# Patient Record
Sex: Female | Born: 1990 | Race: White | Hispanic: No | Marital: Married | Smoking: Never smoker
Health system: Southern US, Community
[De-identification: ages and names within clinical notes are randomized; demographics above are authoritative.]

## PROBLEM LIST (undated history)

## (undated) DIAGNOSIS — R519 Headache, unspecified: Secondary | ICD-10-CM

## (undated) DIAGNOSIS — Z91018 Allergy to other foods: Secondary | ICD-10-CM

## (undated) DIAGNOSIS — O1490 Unspecified pre-eclampsia, unspecified trimester: Secondary | ICD-10-CM

## (undated) DIAGNOSIS — O24419 Gestational diabetes mellitus in pregnancy, unspecified control: Secondary | ICD-10-CM

## (undated) DIAGNOSIS — K829 Disease of gallbladder, unspecified: Secondary | ICD-10-CM

## (undated) DIAGNOSIS — K760 Fatty (change of) liver, not elsewhere classified: Secondary | ICD-10-CM

## (undated) DIAGNOSIS — J302 Other seasonal allergic rhinitis: Secondary | ICD-10-CM

## (undated) DIAGNOSIS — R7303 Prediabetes: Secondary | ICD-10-CM

## (undated) DIAGNOSIS — M549 Dorsalgia, unspecified: Secondary | ICD-10-CM

## (undated) DIAGNOSIS — M7989 Other specified soft tissue disorders: Secondary | ICD-10-CM

## (undated) DIAGNOSIS — J45909 Unspecified asthma, uncomplicated: Secondary | ICD-10-CM

## (undated) DIAGNOSIS — R51 Headache: Secondary | ICD-10-CM

## (undated) DIAGNOSIS — N949 Unspecified condition associated with female genital organs and menstrual cycle: Secondary | ICD-10-CM

## (undated) DIAGNOSIS — G43909 Migraine, unspecified, not intractable, without status migrainosus: Secondary | ICD-10-CM

## (undated) DIAGNOSIS — K5900 Constipation, unspecified: Secondary | ICD-10-CM

## (undated) HISTORY — DX: Gestational diabetes mellitus in pregnancy, unspecified control: O24.419

## (undated) HISTORY — DX: Unspecified pre-eclampsia, unspecified trimester: O14.90

## (undated) HISTORY — DX: Unspecified condition associated with female genital organs and menstrual cycle: N94.9

## (undated) HISTORY — DX: Unspecified asthma, uncomplicated: J45.909

## (undated) HISTORY — PX: CYST EXCISION: SHX5701

## (undated) HISTORY — DX: Allergy to other foods: Z91.018

## (undated) HISTORY — DX: Other specified soft tissue disorders: M79.89

## (undated) HISTORY — DX: Dorsalgia, unspecified: M54.9

## (undated) HISTORY — DX: Prediabetes: R73.03

## (undated) HISTORY — DX: Constipation, unspecified: K59.00

## (undated) HISTORY — DX: Disease of gallbladder, unspecified: K82.9

## (undated) HISTORY — DX: Other seasonal allergic rhinitis: J30.2

## (undated) HISTORY — PX: APPENDECTOMY: SHX54

## (undated) HISTORY — DX: Fatty (change of) liver, not elsewhere classified: K76.0

## (undated) HISTORY — DX: Migraine, unspecified, not intractable, without status migrainosus: G43.909

---

## 2010-09-29 ENCOUNTER — Emergency Department (HOSPITAL_COMMUNITY)
Admission: EM | Admit: 2010-09-29 | Discharge: 2010-09-29 | Payer: Self-pay | Source: Home / Self Care | Admitting: Emergency Medicine

## 2013-09-29 ENCOUNTER — Other Ambulatory Visit (HOSPITAL_COMMUNITY)
Admission: RE | Admit: 2013-09-29 | Discharge: 2013-09-29 | Disposition: A | Discharge status: Home / Self Care | Payer: BC Managed Care – PPO | Source: Ambulatory Visit | Attending: Family Medicine | Admitting: Family Medicine

## 2013-09-29 ENCOUNTER — Other Ambulatory Visit: Payer: Self-pay | Admitting: Family Medicine

## 2013-09-29 DIAGNOSIS — Z01419 Encounter for gynecological examination (general) (routine) without abnormal findings: Secondary | ICD-10-CM | POA: Insufficient documentation

## 2014-11-04 ENCOUNTER — Ambulatory Visit (INDEPENDENT_AMBULATORY_CARE_PROVIDER_SITE_OTHER): Payer: BC Managed Care – PPO | Admitting: Neurology

## 2014-11-04 ENCOUNTER — Encounter: Payer: Self-pay | Admitting: Neurology

## 2014-11-04 VITALS — BP 132/78 | HR 88 | Ht 64.0 in | Wt 225.0 lb

## 2014-11-04 DIAGNOSIS — M79602 Pain in left arm: Secondary | ICD-10-CM

## 2014-11-04 DIAGNOSIS — M79601 Pain in right arm: Secondary | ICD-10-CM

## 2014-11-04 DIAGNOSIS — E669 Obesity, unspecified: Secondary | ICD-10-CM

## 2014-11-04 DIAGNOSIS — M79603 Pain in arm, unspecified: Secondary | ICD-10-CM

## 2014-11-04 DIAGNOSIS — G5622 Lesion of ulnar nerve, left upper limb: Secondary | ICD-10-CM

## 2014-11-04 DIAGNOSIS — R202 Paresthesia of skin: Secondary | ICD-10-CM

## 2014-11-04 NOTE — Patient Instructions (Addendum)
You most likely have either carpal tunnel syndrome or ulnar neuropathy affecting the left hand.  We will order a nerve conduction study/EMG of the hands to better help localize the problem.    Keep up the good work with your weight loss strategies  We will discuss the results at your next visit in 6-8 weeks.

## 2014-11-04 NOTE — Progress Notes (Signed)
[facility] Neurology [facility] Clinic Note - Initial Visit   Date: 11/04/2014   Catherine Christian MRN: [id number] DOB: [birth year]   Dear Dr. Azucena Cecil:  Thank you for your kind referral of Catherine Christian for consultation of bilateral hand parethesias. Although her history is well known to you, please allow [place] to reiterate it for the purpose of our medical record. The patient was accompanied to the clinic by self.    History of Present Illness: Catherine Christian is a 24 y.o. right-handed Caucasian female with obesity presenting for evaluation of bilateral hand paresthesias.    Since late 2015, she noticed that her hands would fall asleep, described as numbness/tingling, and wake her up from sleeping.  Two weeks ago, she has a spell where her last three fingers on the left hand never completed woke up and lasted much longer into the afternoon which concerned her, so she called her PCP who referred here to see [place].  Symptoms wake her up from sleeping, but she is usually able to "shake" them to make it better, which alleviates paresthesias within 5-10 minutes.  Denies any weakness or neck discomfort.  She does not have any particular sleeping position which exacerbates symptoms.  PMHx:  None  Past Surgical History  Procedure Laterality Date  . Cyst excision  age 71    neck  . Appendectomy     Medications:   None  Allergies: No Known Allergies  Family History: Family History  Problem Relation Age of Onset  . Healthy Mother   . Healthy Father     Social History: History   Social History  . Marital Status: Single    Spouse Name: N/A  . Number of Children: N/A  . Years of Education: N/A   Occupational History  . Not on file.   Social History Main Topics  . Smoking status: Never Smoker   . Smokeless tobacco: Not on file  . Alcohol Use: 0.0 oz/week    0 Standard drinks or equivalent per week     Comment: once a week  . Drug Use: No  . Sexual Activity: Not on file    Other Topics Concern  . Not on file   Social History Narrative   She is a Pension scheme manager.   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   She lives with twin sister.     Review of Systems:  CONSTITUTIONAL: No fevers, chills, night sweats, or weight loss.   EYES: No visual changes or eye pain ENT: No hearing changes.  No history of nose bleeds.   RESPIRATORY: No cough, wheezing and shortness of breath.   CARDIOVASCULAR: Negative for chest pain, and palpitations.   GI: Negative for abdominal discomfort, blood in stools or black stools.  No recent change in bowel habits.   GU:  No history of incontinence.   MUSCLOSKELETAL: No history of joint pain or swelling.  No myalgias.   SKIN: Negative for lesions, rash, and itching.   HEMATOLOGY/ONCOLOGY: Negative for prolonged bleeding, bruising easily, and swollen nodes.  No history of cancer.   ENDOCRINE: Negative for cold or heat intolerance, polydipsia or goiter.   PSYCH:  No depression or anxiety symptoms.   NEURO: As Above.   Vital Signs:  BP 132/78 mmHg  Pulse 88  Ht  (1.626 m)  Wt 225 lb (102.059 kg)  BMI 38.60 kg/m2   General Medical Exam:   General:  Well appearing, comfortable.   Eyes/ENT: see cranial nerve examination.   Neck:  No masses appreciated.  Full range of motion without tenderness.  No carotid bruits. Respiratory:  Clear to auscultation, good air entry bilaterally.   Cardiac:  Regular rate and rhythm, no murmur.   Extremities:  No deformities, edema, or skin discoloration.  Skin:  No rashes or lesions.  Neurological Exam: MENTAL STATUS including orientation to time, place, person, recent and remote memory, attention span and concentration, language, and fund of knowledge is normal.  Speech is not dysarthric.  CRANIAL NERVES: II:  No visual field defects.  Unremarkable fundi.   III-IV-VI: Pupils equal round and reactive to light.  Normal conjugate, extra-ocular eye movements in all directions of gaze.   No nystagmus.  No ptosis.   V:  Normal facial sensation.     VII:  Normal facial symmetry and movements.    VIII:  Normal hearing and vestibular function.   IX-X:  Normal palatal movement.   XI:  Normal shoulder shrug and head rotation.   XII:  Normal tongue strength and range of motion, no deviation or fasciculation.  MOTOR:  No atrophy, fasciculations or abnormal movements.  No pronator drift.  Tone is normal.  Negative Tinel's at the elbow and wrist bilaterally.  Right Upper Extremity:    Left Upper Extremity:    Deltoid  5/5   Deltoid  5/5   Biceps  5/5   Biceps  5/5   Triceps  5/5   Triceps  5/5   Wrist extensors  5/5   Wrist extensors  5/5   Wrist flexors  5/5   Wrist flexors  5/5   Finger extensors  5/5   Finger extensors  5/5   Finger flexors  5/5   Finger flexors  5/5   Dorsal interossei  5/5   Dorsal interossei  5/5   Abductor pollicis  5/5   Abductor pollicis  5/5   Tone (Ashworth scale)  0  Tone (Ashworth scale)  0   Right Lower Extremity:    Left Lower Extremity:    Hip flexors  5/5   Hip flexors  5/5   Hip extensors  5/5   Hip extensors  5/5   Knee flexors  5/5   Knee flexors  5/5   Knee extensors  5/5   Knee extensors  5/5   Dorsiflexors  5/5   Dorsiflexors  5/5   Plantarflexors  5/5   Plantarflexors  5/5   Toe extensors  5/5   Toe extensors  5/5   Toe flexors  5/5   Toe flexors  5/5   Tone (Ashworth scale)  0  Tone (Ashworth scale)  0   MSRs:  Right                                                                 Left brachioradialis 2+  brachioradialis 2+  biceps 2+  biceps 2+  triceps 2+  triceps 2+  patellar 2+  patellar 2+  ankle jerk 2+  ankle jerk 2+  Hoffman no  Hoffman no  plantar response down  plantar response down   SENSORY:  Normal and symmetric perception of light touch, pinprick, vibration, and proprioception.  Romberg's sign absent.   COORDINATION/GAIT: Normal finger-to- nose-finger and heel-to-shin.  Intact rapid alternating movements  bilaterally.  Able to rise from a chair without using arms.  Gait narrow based and stable. Tandem and stressed gait intact.    IMPRESSION/PLAN: [name] is a delightful 24 year-old female presenting for evaluation of bilateral hand paresthesias, worse on the left.  Her exam is entirely normal and non-focal.  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  EMG of the arms will be ordered to better localize her symptoms.    From generalized well-being stand point, weight loss was encouraged.  Return to clinic in 6-8 weeks   The duration of this appointment visit was 35 minutes of face-to-face time with the patient.  Greater than 50% of this time was spent in counseling, explanation of diagnosis, planning of further management, and coordination of care.   Thank you for allowing me to participate in patient's care.  If I can answer any additional questions, I would be pleased to do so.    Sincerely,    Karryn Kosinski K. Allena Katz, DO

## 2014-11-04 NOTE — Progress Notes (Signed)
Note faxed.

## 2014-12-12 ENCOUNTER — Ambulatory Visit (INDEPENDENT_AMBULATORY_CARE_PROVIDER_SITE_OTHER): Payer: BC Managed Care – PPO | Admitting: Neurology

## 2014-12-12 DIAGNOSIS — G5622 Lesion of ulnar nerve, left upper limb: Secondary | ICD-10-CM

## 2014-12-12 NOTE — Procedures (Signed)
[facility]  [address], [address]  [place], [place] [address] Tel: [phone] Fax:  [phone] Test Date:  12/12/2014  Patient: Catherine CottonMarissa Christian DOB: Feb 02, 1991 Physician: Nita Sickleonika Tyshan Enderle  Sex: Female Height: 5\' 4"  Ref Phys: Nita Sickleatel, Liliyana Thobe  ID#: [id number] Temp: 33.0C Technician: Ala BentSusan Reid R. NCS T.   Patient Complaints: Patient is a 24 year old female here for evaluation of paresthesias in both hands left worse than right.  NCV & EMG Findings: Extensive electrodiagnostic testing of the left upper extremity and additional studies of the right shows:  1. Bilateral median, ulnar, and palmar sensory responses are within normal limits.  2. Bilateral median and ulnar motor responses are within normal limits.  3. There is no evidence of active or chronic motor axon loss changes affecting any of the tested muscles. Motor unit configuration and recruitment pattern is normal.   Impression: This is a normal study of the upper extremities. In particular, there is no evidence of carpal tunnel syndrome, ulnar neuropathy, or cervical radiculopathy affecting the upper extremities.   ___________________________ Nita Sickleonika Daesia Zylka    Nerve Conduction Studies Anti Sensory Summary Table   Stim Site NR Peak (ms) Norm Peak (ms) P-T Amp (V) Norm P-T Amp  Left Median Anti Sensory (2nd Digit)  33C  Wrist    2.9 <3.3 81.0 >20  Right Median Anti Sensory (2nd Digit)  33C  Wrist    2.6 <3.3 68.2 >20  Left Ulnar Anti Sensory (5th Digit)  33C  Wrist    2.9 <3.0 46.5 >18  Right Ulnar Anti Sensory (5th Digit)  33C  Wrist    2.4 <3.0 45.2 >18   Motor Summary Table   Stim Site NR Onset (ms) Norm Onset (ms) O-P Amp (mV) Norm O-P Amp Site1 Site2 Delta-0 (ms) Dist (cm) Vel (m/s) Norm Vel (m/s)  Left Median Motor (Abd Poll Brev)  33C  Wrist    3.0 <3.9 16.6 >6 Elbow Wrist 4.5 27.0 60 >51  Elbow    7.5  14.8         Right Median Motor (Abd Poll Brev)  33C  Wrist    2.7 <3.9 14.5 >6 Elbow  Wrist 3.8 24.5 64 >51  Elbow    6.5  13.0         Left Ulnar Motor (Abd Dig Minimi)  33C  Wrist    2.1 <3.0 9.8 >8 B Elbow Wrist 3.6 22.0 61 >51  B Elbow    5.7  9.6  A Elbow B Elbow 1.6 10.0 63 >51  A Elbow    7.3  9.3         Right Ulnar Motor (Abd Dig Minimi)  33C  Wrist    2.0 <3.0 11.7 >8 B Elbow Wrist 3.4 24.0 71 >51  B Elbow    5.4  11.6  A Elbow B Elbow 1.4 10.0 71 >51  A Elbow    6.8  11.7          Comparison Summary Table   Stim Site NR Peak (ms) Norm Peak (ms) P-T Amp (V) Site1 Site2 Delta-P (ms) Norm Delta (ms)  Left Median/Ulnar Palm Comparison (Wrist - 8cm)  33C  Median Palm    1.7 <2.2 68.8 Median Palm Ulnar Palm 0.1   Ulnar Palm    1.8 <2.2 20.2      Right Median/Ulnar Palm Comparison (Wrist - 8cm)  33C  Median Palm    1.6 <2.2 71.1 Median Palm Ulnar Palm 0.2  Ulnar Palm    1.4 <2.2 23.5       EMG   Side Muscle Ins Act Fibs Psw Fasc Number Recrt Dur Dur. Amp Amp. Poly Poly. Comment  Left 1stDorInt Nml Nml Nml Nml Nml Nml Nml Nml Nml Nml Nml Nml N/A  Left Ext Indicis Nml Nml Nml Nml Nml Nml Nml Nml Nml Nml Nml Nml N/A  Left PronatorTeres Nml Nml Nml Nml Nml Nml Nml Nml Nml Nml Nml Nml N/A  Left Biceps Nml Nml Nml Nml Nml Nml Nml Nml Nml Nml Nml Nml N/A  Left Triceps Nml Nml Nml Nml Nml Nml Nml Nml Nml Nml Nml Nml N/A  Left Deltoid Nml Nml Nml Nml Nml Nml Nml Nml Nml Nml Nml Nml N/A  Right 1stDorInt Nml Nml Nml Nml Nml Nml Nml Nml Nml Nml Nml Nml N/A  Right Ext Indicis Nml Nml Nml Nml Nml Nml Nml Nml Nml Nml Nml Nml N/A  Right PronatorTeres Nml Nml Nml Nml Nml Nml Nml Nml Nml Nml Nml Nml N/A  Right Biceps Nml Nml Nml Nml Nml Nml Nml Nml Nml Nml Nml Nml N/A  Right Deltoid Nml Nml Nml Nml Nml Nml Nml Nml Nml Nml Nml Nml N/A  Right Triceps Nml Nml Nml Nml Nml Nml Nml Nml Nml Nml Nml Nml N/A      Waveforms:

## 2014-12-15 ENCOUNTER — Ambulatory Visit (INDEPENDENT_AMBULATORY_CARE_PROVIDER_SITE_OTHER): Payer: BC Managed Care – PPO | Admitting: Neurology

## 2014-12-15 ENCOUNTER — Encounter: Payer: Self-pay | Admitting: Neurology

## 2014-12-15 VITALS — BP 110/80 | HR 87 | Ht 64.0 in | Wt 231.4 lb

## 2014-12-15 DIAGNOSIS — E669 Obesity, unspecified: Secondary | ICD-10-CM

## 2014-12-15 DIAGNOSIS — R202 Paresthesia of skin: Secondary | ICD-10-CM | POA: Diagnosis not present

## 2014-12-15 DIAGNOSIS — G5622 Lesion of ulnar nerve, left upper limb: Secondary | ICD-10-CM | POA: Diagnosis not present

## 2014-12-15 LAB — VITAMIN B12: Vitamin B-12: 535 pg/mL (ref 211–911)

## 2014-12-15 LAB — TSH: TSH: 1.607 u[IU]/mL (ref 0.350–4.500)

## 2014-12-15 NOTE — Patient Instructions (Addendum)
Try to avoid hyperflexion of the elbow and start using a soft elbow pad. Stay active and start an exercise program you enjoy We will check vitamin B12 and thyroid function Come back and see me, if your symptoms worsen

## 2014-12-15 NOTE — Progress Notes (Signed)
Follow-up Visit   Date: 12/15/2014    Catherine Christian MRN: [id number] DOB: 30-Dec-1990   Interim History: Catherine Christian is a 24 y.o. right-handed Caucasian female returning to the clinic for follow-up of bilateral hand paresthesias.  The patient was accompanied to the clinic by self.  History of present illness: Since late 2015, she noticed that her hands would fall asleep, described as numbness/tingling, and wake her up from sleeping. Two weeks ago, she has a spell where her last three fingers on the left hand never completed woke up and lasted much longer into the afternoon which concerned her, so she called her PCP who referred here to see us. Symptoms wake her up from sleeping, but she is usually able to "shake" them to make it better, which alleviates paresthesias within 5-10 minutes. Denies any weakness or neck discomfort. She does not have any particular sleeping position which exacerbates symptoms.  UPDATE 12/15/2014:  EMG of the upper extremities was normal and did not show evidence of CTS, ulnar neuropathy, or a cervical radiculopathy.   Since her last visit, her hand sensation did return to normal.  However, she notices that when her hands do fall asleep, they will wake up again.  She has no new complaints.   Medications:  No current outpatient prescriptions on file prior to visit.   No current facility-administered medications on file prior to visit.    Allergies: No Known Allergies  Review of Systems:  CONSTITUTIONAL: No fevers, chills, night sweats, or weight loss.  EYES: No visual changes or eye pain ENT: No hearing changes.  No history of nose bleeds.   RESPIRATORY: No cough, wheezing and shortness of breath.   CARDIOVASCULAR: Negative for chest pain, and palpitations.   GI: Negative for abdominal discomfort, blood in stools or black stools.  No recent change in bowel habits.   GU:  No history of incontinence.   MUSCLOSKELETAL: No history of joint pain or  swelling.  No myalgias.   SKIN: Negative for lesions, rash, and itching.   ENDOCRINE: Negative for cold or heat intolerance, polydipsia or goiter.   PSYCH:  No depression or anxiety symptoms.   NEURO: As Above.   Vital Signs:  BP 110/80 mmHg  Pulse 87  Ht 5\' 4"  (1.626 m)  Wt 231 lb 7 oz (104.979 kg)  BMI 39.71 kg/m2  SpO2 97%  Neurological Exam: MENTAL STATUS including orientation to time, place, person, recent and remote memory, attention span and concentration, language, and fund of knowledge is normal.  Speech is not dysarthric.  CRANIAL NERVES:  Pupils equal round and reactive to light.  Normal conjugate, extra-ocular eye movements in all directions of gaze.  No ptosis. Face is symmetric. Palate elevates symmetrically.  Tongue is midline.  MOTOR:  Motor strength is 5/5 in all extremities.  No atrophy, fasciculations or abnormal movements.  No pronator drift.  Tone is normal.    MSRs:  Reflexes are 2+/4 throughout.  SENSORY:  Intact to vibration throughout.  COORDINATION/GAIT:    Gait narrow based and stable.   Data: EMG of the upper extremities 12/12/2014: This is a normal study of the upper extremities. In particular, there is no evidence of carpal tunnel syndrome, ulnar neuropathy, or cervical radiculopathy affecting the upper extremities.   IMPRESSION/PLAN: Catherine Christian is a delightful 24 year-old female presenting for evaluation of bilateral hand paresthesias, worse on the left. Her exam is entirely normal and non-focal and she is doing better than previously because symptoms are  intermittent again.  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  It is possible that symptoms are too mild to be detected on EMG and we may need to see how her symptoms evolve over time. I would recommend that we treat this as possible entrapment neuropathy and avoid hyperflexion of the elbow as well as start  using a soft elbow bad.   If symptoms worsen, patient will contact my office so we can order MRI brain.    For completeness, check TSH and vitamin B12 for paresthesias.  Weight loss strategies discussed and I encouraged her to start an exercise program which she enjoys doing.  Return to clinic as needed.    The duration of this appointment visit was 25 minutes of face-to-face time with the patient.  Greater than 50% of this time was spent in counseling, explanation of diagnosis, planning of further management, and coordination of care.   Thank you for allowing me to participate in patient's care.  If I can answer any additional questions, I would be pleased to do so.    Sincerely,    Sita Mangen K. Allena Katz, DO

## 2014-12-15 NOTE — Progress Notes (Signed)
Note sent

## 2015-07-04 ENCOUNTER — Other Ambulatory Visit: Payer: Self-pay | Admitting: Family Medicine

## 2015-07-04 DIAGNOSIS — R1031 Right lower quadrant pain: Secondary | ICD-10-CM

## 2015-07-10 ENCOUNTER — Ambulatory Visit
Admission: RE | Admit: 2015-07-10 | Discharge: 2015-07-10 | Disposition: A | Discharge status: Home / Self Care | Payer: BC Managed Care – PPO | Source: Ambulatory Visit | Attending: Family Medicine | Admitting: Family Medicine

## 2015-07-10 DIAGNOSIS — R1031 Right lower quadrant pain: Secondary | ICD-10-CM

## 2015-07-13 ENCOUNTER — Other Ambulatory Visit: Payer: Self-pay | Admitting: Family Medicine

## 2015-07-13 DIAGNOSIS — R109 Unspecified abdominal pain: Secondary | ICD-10-CM

## 2015-07-15 ENCOUNTER — Inpatient Hospital Stay (HOSPITAL_COMMUNITY): Payer: BC Managed Care – PPO

## 2015-07-15 ENCOUNTER — Encounter (HOSPITAL_COMMUNITY): Payer: Self-pay

## 2015-07-15 ENCOUNTER — Inpatient Hospital Stay (HOSPITAL_COMMUNITY)
Admission: AD | Admit: 2015-07-15 | Discharge: 2015-07-15 | Disposition: A | Discharge status: Home / Self Care | Payer: BC Managed Care – PPO | Source: Ambulatory Visit | Attending: Obstetrics & Gynecology | Admitting: Obstetrics & Gynecology

## 2015-07-15 DIAGNOSIS — R319 Hematuria, unspecified: Secondary | ICD-10-CM | POA: Insufficient documentation

## 2015-07-15 DIAGNOSIS — N83209 Unspecified ovarian cyst, unspecified side: Secondary | ICD-10-CM | POA: Insufficient documentation

## 2015-07-15 DIAGNOSIS — R1031 Right lower quadrant pain: Secondary | ICD-10-CM | POA: Diagnosis not present

## 2015-07-15 HISTORY — DX: Headache, unspecified: R51.9

## 2015-07-15 HISTORY — DX: Headache: R51

## 2015-07-15 LAB — URINE MICROSCOPIC-ADD ON

## 2015-07-15 LAB — CBC WITH DIFFERENTIAL/PLATELET
Basophils Absolute: 0.1 10*3/uL (ref 0.0–0.1)
Basophils Relative: 1 %
EOS ABS: 0.3 10*3/uL (ref 0.0–0.7)
EOS PCT: 3 %
HCT: 42.9 % (ref 36.0–46.0)
Hemoglobin: 14.6 g/dL (ref 12.0–15.0)
LYMPHS ABS: 3.2 10*3/uL (ref 0.7–4.0)
Lymphocytes Relative: 37 %
MCH: 30.1 pg (ref 26.0–34.0)
MCHC: 34 g/dL (ref 30.0–36.0)
MCV: 88.5 fL (ref 78.0–100.0)
MONOS PCT: 7 %
Monocytes Absolute: 0.6 10*3/uL (ref 0.1–1.0)
Neutro Abs: 4.5 10*3/uL (ref 1.7–7.7)
Neutrophils Relative %: 52 %
PLATELETS: 274 10*3/uL (ref 150–400)
RBC: 4.85 MIL/uL (ref 3.87–5.11)
RDW: 12.7 % (ref 11.5–15.5)
WBC: 8.6 10*3/uL (ref 4.0–10.5)

## 2015-07-15 LAB — URINALYSIS, ROUTINE W REFLEX MICROSCOPIC
BILIRUBIN URINE: NEGATIVE
Bilirubin Urine: NEGATIVE
GLUCOSE, UA: NEGATIVE mg/dL
Glucose, UA: NEGATIVE mg/dL
KETONES UR: 15 mg/dL — AB
KETONES UR: NEGATIVE mg/dL
LEUKOCYTES UA: NEGATIVE
Leukocytes, UA: NEGATIVE
NITRITE: NEGATIVE
NITRITE: NEGATIVE
PH: 5.5 (ref 5.0–8.0)
PH: 7 (ref 5.0–8.0)
Protein, ur: NEGATIVE mg/dL
Protein, ur: NEGATIVE mg/dL
SPECIFIC GRAVITY, URINE: 1.02 (ref 1.005–1.030)
Specific Gravity, Urine: >1.03 — ABNORMAL HIGH (ref 1.005–1.030)
Urobilinogen, UA: 0.2 mg/dL (ref 0.0–1.0)
Urobilinogen, UA: 0.2 mg/dL (ref 0.0–1.0)

## 2015-07-15 LAB — POCT PREGNANCY, URINE: Preg Test, Ur: NEGATIVE

## 2015-07-15 MED ORDER — MEGESTROL ACETATE 40 MG PO TABS: 40.0000 mg | ORAL_TABLET | Freq: Every day | ORAL | Status: DC

## 2015-07-15 MED ORDER — HYDROCODONE-ACETAMINOPHEN 5-325 MG PO TABS: 1.0000 | ORAL_TABLET | Freq: Four times a day (QID) | ORAL | Status: DC | PRN

## 2015-07-15 MED ORDER — ONDANSETRON HCL 4 MG PO TABS: 4.0000 mg | ORAL_TABLET | Freq: Three times a day (TID) | ORAL | Status: DC | PRN

## 2015-07-15 MED ORDER — IBUPROFEN 600 MG PO TABS: 600.0000 mg | ORAL_TABLET | Freq: Four times a day (QID) | ORAL | Status: DC | PRN

## 2015-07-15 NOTE — MAU Provider Note (Signed)
Chief Complaint: Ovarian Cyst   First Provider Initiated Contact with Patient 07/15/15 1549     SUBJECTIVE HPI: Catherine Christian is a 24 y.o. G0P0000 at Unknown who presents to [facility] reporting right lower quadrant pain since 07/10/2015 that got much worse 07/13/2015. Has had several episodes of stabbing, tearing right lower quadrant pain since then with moderate pain in between. Saw primary care provider for this problem. Had pelvic ultrasound (not transvaginal ultrasound due to never having had intercourse) on 07/10/2015 that showed a 3 cm left ovarian cyst. Right ovary not visualized. States she had an  CT 07/13/2015 that showed a cyst on the right ovary. Unsure if it also showed a cyst on the left ovary. Patient brought disc with CT images, but no report can be found on disc and report is not in Epic. Contacted radiology reading room to see if they had access to images or report, but they state that they do not.  Patient's last menstrual period was 07/11/2015. still having scant bleeding.  History of appendectomy as a teenager. History ovarian cysts. Primary care provider has prescribed her OCPs for cyst suppression.  Location: Right lower quadrant Quality: Stabbing, tearing Severity: 10/10 on pain scale at its worst. 8/10 on pain scale since taking hydrocodone. Duration: One week Course: Worsening over the past 3 days since 07/13/2015 Context: None Timing: Constant, but with intermittent exacerbations. Modifying factors: Improves with hydrocodone and rest. Worse with movement. Associated signs and symptoms: Negative for fever, chills, vaginal bleeding, vaginal discharge, nausea, vomiting, diarrhea, constipation, urinary complaints.  Past Medical History  Diagnosis Date  . Headache    OB History  Gravida Para Term Preterm AB SAB TAB Ectopic Multiple Living  0 0 0 0 0 0 0 0 0 0       Obstetric Comments  Abdominal pain right side 5/10. Patient had just taken hydrocodone  at 1400 - 1 tab.    Past Surgical History  Procedure Laterality Date  . Cyst excision  age 36    neck  . Appendectomy     Social History   Social History  . Marital Status: Single    Spouse Name: N/A  . Number of Children: N/A  . Years of Education: N/A   Occupational History  . Not on file.   Social History Main Topics  . Smoking status: Never Smoker   . Smokeless tobacco: Not on file  . Alcohol Use: 0.0 oz/week    0 Standard drinks or equivalent per week     Comment: once a week  . Drug Use: No  . Sexual Activity: Never   Other Topics Concern  . Not on file   Social History Narrative   She is a Pension scheme manager.   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   She lives with twin sister.    No current facility-administered medications on file prior to encounter.   No current outpatient prescriptions on file prior to encounter.   No Known Allergies  I have reviewed the past Medical Hx, Surgical Hx, Social Hx, Allergies and Medications.   Review of Systems  Constitutional: Negative for fever, chills and appetite change.  Gastrointestinal: Positive for abdominal pain. Negative for nausea, vomiting, diarrhea, constipation and blood in stool.  Genitourinary: Negative for dysuria, urgency, frequency, hematuria, flank pain, decreased urine volume, vaginal bleeding, vaginal discharge, menstrual problem and pelvic pain.  Neurological: Negative for weakness.    OBJECTIVE Patient Vitals for the past 24 hrs:  BP Temp Temp  src Pulse Resp  07/15/15 2054 140/91 mmHg - - 107 18  07/15/15 1640 132/78 mmHg - - 105 20  07/15/15 1459 142/89 mmHg 98.2 F (36.8 C) Oral 117 18   Constitutional: Well-developed, well-nourished female in mild-moderate distress. Occasionally tearful. Cardiovascular: Mild tachycardia. Respiratory: normal rate and effort.  GI: Abd soft, moderate right groin tenderness. Negative rebound tenderness or mass. Pos BS x 4 Neurologic: Alert and oriented x  4.  GU: Neg CVAT.  SPECULUM EXAM: Refused  LAB RESULTS Results for orders placed or performed during the hospital encounter of 07/15/15 (from the past 24 hour(s))  Urinalysis, Routine w reflex microscopic (not at [facility])     Status: Abnormal   Collection Time: 07/15/15  3:05 PM  Result Value Ref Range   Color, Urine YELLOW YELLOW   APPearance CLEAR CLEAR   Specific Gravity, Urine >1.030 (H) 1.005 - 1.030   pH 5.5 5.0 - 8.0   Glucose, UA NEGATIVE NEGATIVE mg/dL   Hgb urine dipstick LARGE (A) NEGATIVE   Bilirubin Urine NEGATIVE NEGATIVE   Ketones, ur 15 (A) NEGATIVE mg/dL   Protein, ur NEGATIVE NEGATIVE mg/dL   Urobilinogen, UA 0.2 0.0 - 1.0 mg/dL   Nitrite NEGATIVE NEGATIVE   Leukocytes, UA NEGATIVE NEGATIVE  Urine microscopic-add on     Status: Abnormal   Collection Time: 07/15/15  3:05 PM  Result Value Ref Range   Squamous Epithelial / LPF FEW (A) RARE   WBC, UA 3-6 <3 WBC/hpf   RBC / HPF 3-6 <3 RBC/hpf   Bacteria, UA RARE RARE   Urine-Other MUCOUS PRESENT   Pregnancy, urine POC     Status: None   Collection Time: 07/15/15  3:18 PM  Result Value Ref Range   Preg Test, Ur NEGATIVE NEGATIVE  CBC with Differential/Platelet     Status: None   Collection Time: 07/15/15  4:10 PM  Result Value Ref Range   WBC 8.6 4.0 - 10.5 K/uL   RBC 4.85 3.87 - 5.11 MIL/uL   Hemoglobin 14.6 12.0 - 15.0 g/dL   HCT 57.8 46.9 - 62.9 %   MCV 88.5 78.0 - 100.0 fL   MCH 30.1 26.0 - 34.0 pg   MCHC 34.0 30.0 - 36.0 g/dL   RDW 52.8 41.3 - 24.4 %   Platelets 274 150 - 400 K/uL   Neutrophils Relative % 52 %   Neutro Abs 4.5 1.7 - 7.7 K/uL   Lymphocytes Relative 37 %   Lymphs Abs 3.2 0.7 - 4.0 K/uL   Monocytes Relative 7 %   Monocytes Absolute 0.6 0.1 - 1.0 K/uL   Eosinophils Relative 3 %   Eosinophils Absolute 0.3 0.0 - 0.7 K/uL   Basophils Relative 1 %   Basophils Absolute 0.1 0.0 - 0.1 K/uL  Urinalysis, Routine w reflex microscopic (not at [facility])     Status: Abnormal   Collection Time:  07/15/15  6:57 PM  Result Value Ref Range   Color, Urine YELLOW YELLOW   APPearance CLEAR CLEAR   Specific Gravity, Urine 1.020 1.005 - 1.030   pH 7.0 5.0 - 8.0   Glucose, UA NEGATIVE NEGATIVE mg/dL   Hgb urine dipstick LARGE (A) NEGATIVE   Bilirubin Urine NEGATIVE NEGATIVE   Ketones, ur NEGATIVE NEGATIVE mg/dL   Protein, ur NEGATIVE NEGATIVE mg/dL   Urobilinogen, UA 0.2 0.0 - 1.0 mg/dL   Nitrite NEGATIVE NEGATIVE   Leukocytes, UA NEGATIVE NEGATIVE  Urine microscopic-add on     Status: Abnormal   Collection  Time: 07/15/15  6:57 PM  Result Value Ref Range   Squamous Epithelial / LPF RARE RARE   WBC, UA 0-2 <3 WBC/hpf   RBC / HPF 3-6 <3 RBC/hpf   Bacteria, UA FEW (A) RARE   UA requested due to concern for contamination with menstrual blood.  IMAGING US Pelvis Complete  07/15/2015  CLINICAL DATA:  Diagnosed with a 3 cm left ovarian cyst on ultrasound of October 24th and a right ovarian cyst by CT on October 28th. Now with right lower quadrant pain.  Rule out torsion. Previous history of appendectomy. Transvaginal exam was declined, patient not sexually active EXAM: TRANSABDOMINAL ULTRASOUND OF PELVIS DOPPLER ULTRASOUND OF OVARIES TECHNIQUE: Transabdominal ultrasound examination of the pelvis was performed including evaluation of the uterus, ovaries, adnexal regions, and pelvic cul-de-sac. Color and duplex Doppler ultrasound was utilized to evaluate blood flow to the ovaries. COMPARISON:  None. FINDINGS: Uterus Measurements: 9.1 x 3.6 x 5.6 cm. No fibroids or other mass visualized. Endometrium Thickness: Normal at 10 mm. No mass or fluid within the endometrial canal. Right ovary Measurements: 4.6 x 3.8 x 4.1 cm. Right ovarian simple cyst measuring 4.6 x 3 cm. Normal arterial and venous blood flow shown within the right ovary. No mass or free fluid within the adjacent right adnexa. Left ovary Measurements: 2.9 x 1.5 x 2.2 cm. Left ovary appears normal. Normal arterial and venous blood flow  shown within the left ovary. No mass or free fluid within the adjacent left adnexa. As above, pulsed Doppler evaluation demonstrates normal low-resistance arterial and venous waveforms in both ovaries. IMPRESSION: 1. Right ovarian cyst measuring 4.6 x 3 x 3.5 cm. Right ovary otherwise unremarkable. No evidence of ovarian torsion. No mass or free fluid within the adjacent right adnexa. 2. Uterus and left ovary appear normal. Electronically Signed   By: Bary Richard M.D.   On: 07/15/2015 18:20   US Pelvis Complete  07/10/2015  CLINICAL DATA:  Right lower quadrant abdominal pain. History of an appendectomy. History of ovarian cysts. EXAM: TRANSABDOMINAL ULTRASOUND OF PELVIS TECHNIQUE: Transabdominal ultrasound examination of the pelvis was performed including evaluation of the uterus, ovaries, adnexal regions, and pelvic cul-de-sac. COMPARISON:  None. FINDINGS: Uterus Measurements: 8.0 x 3.1 x 4.3 cm. No fibroids or other mass visualized. Endometrium Thickness: 4.2 mm.  No focal abnormality visualized. Right ovary Not visualized.  No right adnexal mass or abnormal fluid collection. Left ovary Measurements: 5.1 x 3.7 x 3.9 cm. Simple appearing oval cyst lies within the right ovary measuring 2.6 x 3.2 x 4.3 cm. No left adnexal mass or abnormal fluid collection. Other findings:  No free fluid IMPRESSION: 1. 4.3 cm simple appearing left ovarian cyst likely a physiologic cyst. No evidence of cyst rupture. No free fluid. 2. Right ovary not visualized. No right adnexal mass or abnormal fluid collection. There are no findings to explain right lower quadrant pain. 3. Normal uterus. Electronically Signed   By: Amie Portland M.D.   On: 07/10/2015 17:00   [place] Art/ven Flow Abd Pelv Doppler  07/15/2015  CLINICAL DATA:  Diagnosed with a 3 cm left ovarian cyst on ultrasound of October 24th and a right ovarian cyst by CT on October 28th. Now with right lower quadrant pain.  Rule out torsion. Previous history of appendectomy.  Transvaginal exam was declined, patient not sexually active EXAM: TRANSABDOMINAL ULTRASOUND OF PELVIS DOPPLER ULTRASOUND OF OVARIES TECHNIQUE: Transabdominal ultrasound examination of the pelvis was performed including evaluation of the uterus, ovaries, adnexal regions, and  pelvic cul-de-sac. Color and duplex Doppler ultrasound was utilized to evaluate blood flow to the ovaries. COMPARISON:  None. FINDINGS: Uterus Measurements: 9.1 x 3.6 x 5.6 cm. No fibroids or other mass visualized. Endometrium Thickness: Normal at 10 mm. No mass or fluid within the endometrial canal. Right ovary Measurements: 4.6 x 3.8 x 4.1 cm. Right ovarian simple cyst measuring 4.6 x 3 cm. Normal arterial and venous blood flow shown within the right ovary. No mass or free fluid within the adjacent right adnexa. Left ovary Measurements: 2.9 x 1.5 x 2.2 cm. Left ovary appears normal. Normal arterial and venous blood flow shown within the left ovary. No mass or free fluid within the adjacent left adnexa. As above, pulsed Doppler evaluation demonstrates normal low-resistance arterial and venous waveforms in both ovaries. IMPRESSION: 1. Right ovarian cyst measuring 4.6 x 3 x 3.5 cm. Right ovary otherwise unremarkable. No evidence of ovarian torsion. No mass or free fluid within the adjacent right adnexa. 2. Uterus and left ovary appear normal. Electronically Signed   By: Bary RichardStan  Maynard M.D.   On: 07/15/2015 18:20    MAU COURSE CBC, pelvic Dopplers, UA, UPT, wet prep. Refuses GC/chlamydia cultures and HIV.  Warm compresses. Patient declined pain meds.  MDM -24 year old nonpregnant female with 4.6 cm right ovarian cyst without evidence of ovarian torsion. Although there are conflicting reports of the location of her cyst, the sonographer at [facility] today is very confident that is on the right. -Appendicitis ruled out due to previous appendectomy. -Hematuria with otherwise normal UA. Patient was not verbally notified of any evidence  of kidney stones on CT. Recommend that she call primary care provider in the morning to ask him to review the report for kidney stones. Will send urine for culture.  ASSESSMENT 1. Ovarian cyst   2. Abdominal pain, right lower quadrant   3. Hematuria     PLAN Discharge home in stable condition. Abdominal pain and torsion Precautions List of gynecologist given. Per consult with Dr. Despina HiddenEure will have patient stop OCPs and take Megace for cyst suppression until she follows up with gynecologist for repeat ultrasound.     Follow-up Information    Follow up with Gynecologist of your choice In 6 weeks.   Why:  For follow-up ultrasound of ovarian cyst      Follow up with Leanor RubensteinSUN,VYVYAN Y, MD.   Specialty:  Family Medicine   Why:  For further evaluation of blood in urine and discuss results of CT   Contact information:   [address] [place] [address] [phone]       [facility]   Why:  As needed in gynecologic emergencies   Contact information:   [address] [id number] [place] [id number] [phone]       Medication List    STOP taking these medications        levonorgestrel-ethinyl estradiol 0.1-20 MG-MCG tablet  Commonly known as:  AVIANE,ALESSE,LESSINA      TAKE these medications        cetirizine 10 MG tablet  Commonly known as:  ZYRTEC  Take 10 mg by mouth daily.     diphenhydrAMINE 25 MG tablet  Commonly known as:  BENADRYL  Take 25 mg by mouth every 6 (six) hours as needed for allergies.     HYDROcodone-acetaminophen 5-325 MG tablet  Commonly known as:  NORCO/VICODIN  Take 1-2 tablets by mouth every 6 (six)  hours as needed for severe pain.     ibuprofen 600 MG tablet  Commonly known as:  ADVIL,MOTRIN  Take 1 tablet (600 mg total) by mouth every 6 (six) hours as needed for moderate pain.     megestrol 40 MG tablet  Commonly known as:  MEGACE  Take 1  tablet (40 mg total) by mouth daily.     ondansetron 4 MG tablet  Commonly known as:  ZOFRAN  Take 1 tablet (4 mg total) by mouth every 8 (eight) hours as needed for nausea or vomiting.         Dorathy Kinsman, CNM 07/15/2015  9:00 PM

## 2015-07-15 NOTE — Discharge Instructions (Signed)
Ovarian Cyst An ovarian cyst is a fluid-filled sac that forms on an ovary. The ovaries are small organs that produce eggs in women. Various types of cysts can form on the ovaries. Most are not cancerous. Many do not cause problems, and they often go away on their own. Some may cause symptoms and require treatment. Common types of ovarian cysts include:  Functional cysts--These cysts may occur every month during the menstrual cycle. This is normal. The cysts usually go away with the next menstrual cycle if the woman does not get pregnant. Usually, there are no symptoms with a functional cyst.  Endometrioma cysts--These cysts form from the tissue that lines the uterus. They are also called "chocolate cysts" because they become filled with blood that turns brown. This type of cyst can cause pain in the lower abdomen during intercourse and with your menstrual period.  Cystadenoma cysts--This type develops from the cells on the outside of the ovary. These cysts can get very big and cause lower abdomen pain and pain with intercourse. This type of cyst can twist on itself, cut off its blood supply, and cause severe pain. It can also easily rupture and cause a lot of pain.  Dermoid cysts--This type of cyst is sometimes found in both ovaries. These cysts may contain different kinds of body tissue, such as skin, teeth, hair, or cartilage. They usually do not cause symptoms unless they get very big.  Theca lutein cysts--These cysts occur when too much of a certain hormone (human chorionic gonadotropin) is produced and overstimulates the ovaries to produce an egg. This is most common after procedures used to assist with the conception of a baby (in vitro fertilization). CAUSES   Fertility drugs can cause a condition in which multiple large cysts are formed on the ovaries. This is called ovarian hyperstimulation syndrome.  A condition called polycystic ovary syndrome can cause hormonal imbalances that can lead to  nonfunctional ovarian cysts. SIGNS AND SYMPTOMS  Many ovarian cysts do not cause symptoms. If symptoms are present, they may include:  Pelvic pain or pressure.  Pain in the lower abdomen.  Pain during sexual intercourse.  Increasing girth (swelling) of the abdomen.  Abnormal menstrual periods.  Increasing pain with menstrual periods.  Stopping having menstrual periods without being pregnant. DIAGNOSIS  These cysts are commonly found during a routine or annual pelvic exam. Tests may be ordered to find out more about the cyst. These tests may include:  Ultrasound.  X-ray of the pelvis.  CT scan.  MRI.  Blood tests. TREATMENT  Many ovarian cysts go away on their own without treatment. Your health care provider may want to check your cyst regularly for 2-3 months to see if it changes. For women in menopause, it is particularly important to monitor a cyst closely because of the higher rate of ovarian cancer in menopausal women. When treatment is needed, it may include any of the following:  A procedure to drain the cyst (aspiration). This may be done using a long needle and ultrasound. It can also be done through a laparoscopic procedure. This involves using a thin, lighted tube with a tiny camera on the end (laparoscope) inserted through a small incision.  Surgery to remove the whole cyst. This may be done using laparoscopic surgery or an open surgery involving a larger incision in the lower abdomen.  Hormone treatment or birth control pills. These methods are sometimes used to help dissolve a cyst. HOME CARE INSTRUCTIONS   Only take over-the-counter  or prescription medicines as directed by your health care provider.  Follow up with your health care provider as directed.  Get regular pelvic exams and Pap tests. SEEK MEDICAL CARE IF:   Your periods are late, irregular, or painful, or they stop.  Your pelvic pain or abdominal pain does not go away.  Your abdomen becomes  larger or swollen.  You have pressure on your bladder or trouble emptying your bladder completely.  You have pain during sexual intercourse.  You have feelings of fullness, pressure, or discomfort in your stomach.  You lose weight for no apparent reason.  You feel generally ill.  You become constipated.  You lose your appetite.  You develop acne.  You have an increase in body and facial hair.  You are gaining weight, without changing your exercise and eating habits.  You think you are pregnant. SEEK IMMEDIATE MEDICAL CARE IF:   You have increasing abdominal pain.  You feel sick to your stomach (nauseous), and you throw up (vomit).  You develop a fever that comes on suddenly.  You have abdominal pain during a bowel movement.  Your menstrual periods become heavier than usual. MAKE SURE YOU:  Understand these instructions.  Will watch your condition.  Will get help right away if you are not doing well or get worse.   This information is not intended to replace advice given to you by your health care provider. Make sure you discuss any questions you have with your health care provider.   Document Released: 09/02/2005 Document Revised: 09/07/2013 Document Reviewed: 05/10/2013 Elsevier Interactive Patient Education 2016 [employer] Inc[employer]  Hematuria, Adult Hematuria is blood in your urine. It can be caused by a bladder infection, kidney infection, prostate infection, kidney stone, or cancer of your urinary tract. Infections can usually be treated with medicine, and a kidney stone usually will pass through your urine. If neither of these is the cause of your hematuria, further workup to find out the reason may be needed. It is very important that you tell your health care provider about any blood you see in your urine, even if the blood stops without treatment or happens without causing pain. Blood in your urine that happens and then stops and then happens again can be a symptom  of a very serious condition. Also, pain is not a symptom in the initial stages of many urinary cancers. HOME CARE INSTRUCTIONS   Drink lots of fluid, 3-4 quarts a day. If you have been diagnosed with an infection, cranberry juice is especially recommended, in addition to large amounts of water.  Avoid caffeine, tea, and carbonated beverages because they tend to irritate the bladder.  Avoid alcohol because it may irritate the prostate.  Take all medicines as directed by your health care provider.  If you were prescribed an antibiotic medicine, finish it all even if you start to feel better.  If you have been diagnosed with a kidney stone, follow your health care provider's instructions regarding straining your urine to catch the stone.  Empty your bladder often. Avoid holding urine for long periods of time.  After a bowel movement, women should cleanse front to back. Use each tissue only once.  Empty your bladder before and after sexual intercourse if you are a female. SEEK MEDICAL CARE IF:  You develop back pain.  You have a fever.  You have a feeling of sickness in your stomach (nausea) or vomiting.  Your symptoms are not better in 3 days. Return sooner  if you are getting worse. SEEK IMMEDIATE MEDICAL CARE IF:   You develop severe vomiting and are unable to keep the medicine down.  You develop severe back or abdominal pain despite taking your medicines.  You begin passing a large amount of blood or clots in your urine.  You feel extremely weak or faint, or you pass out. MAKE SURE YOU:   Understand these instructions.  Will watch your condition.  Will get help right away if you are not doing well or get worse.   This information is not intended to replace advice given to you by your health care provider. Make sure you discuss any questions you have with your health care provider.   Document Released: 09/02/2005 Document Revised: 09/23/2014 Document Reviewed:  05/03/2013 Elsevier Interactive Patient Education [employer]

## 2015-07-15 NOTE — MAU Note (Signed)
Sharp RLQ pain since Thursday, taking hydrocodone which is helping.  Hx of ovarian cyst, on birth control for the cyst.  Had U/S Thurday & CT on Friday, shows cyst on R side.  Was to referred by GYN but did not get a call back yesterday, pt feels she can't wait to be seen.

## 2015-07-17 LAB — URINE CULTURE: SPECIAL REQUESTS: NORMAL

## 2016-01-09 ENCOUNTER — Other Ambulatory Visit: Payer: Self-pay | Admitting: Family Medicine

## 2016-01-09 DIAGNOSIS — R7989 Other specified abnormal findings of blood chemistry: Secondary | ICD-10-CM

## 2016-01-09 DIAGNOSIS — R945 Abnormal results of liver function studies: Principal | ICD-10-CM

## 2016-01-09 DIAGNOSIS — R748 Abnormal levels of other serum enzymes: Secondary | ICD-10-CM

## 2016-01-09 DIAGNOSIS — R1011 Right upper quadrant pain: Secondary | ICD-10-CM

## 2016-01-11 ENCOUNTER — Emergency Department (HOSPITAL_COMMUNITY)
Admission: EM | Admit: 2016-01-11 | Discharge: 2016-01-11 | Disposition: A | Discharge status: Home / Self Care | Payer: BC Managed Care – PPO | Attending: Emergency Medicine | Admitting: Emergency Medicine

## 2016-01-11 ENCOUNTER — Encounter (HOSPITAL_COMMUNITY): Payer: Self-pay | Admitting: Emergency Medicine

## 2016-01-11 ENCOUNTER — Emergency Department (HOSPITAL_COMMUNITY): Payer: BC Managed Care – PPO

## 2016-01-11 DIAGNOSIS — R1011 Right upper quadrant pain: Secondary | ICD-10-CM | POA: Diagnosis not present

## 2016-01-11 DIAGNOSIS — Z79891 Long term (current) use of opiate analgesic: Secondary | ICD-10-CM | POA: Insufficient documentation

## 2016-01-11 DIAGNOSIS — Z7982 Long term (current) use of aspirin: Secondary | ICD-10-CM | POA: Diagnosis not present

## 2016-01-11 DIAGNOSIS — R110 Nausea: Secondary | ICD-10-CM | POA: Insufficient documentation

## 2016-01-11 DIAGNOSIS — Z79899 Other long term (current) drug therapy: Secondary | ICD-10-CM | POA: Diagnosis not present

## 2016-01-11 DIAGNOSIS — Z791 Long term (current) use of non-steroidal anti-inflammatories (NSAID): Secondary | ICD-10-CM | POA: Insufficient documentation

## 2016-01-11 LAB — CBC
HCT: 43.3 % (ref 36.0–46.0)
Hemoglobin: 14.8 g/dL (ref 12.0–15.0)
MCH: 29.5 pg (ref 26.0–34.0)
MCHC: 34.2 g/dL (ref 30.0–36.0)
MCV: 86.4 fL (ref 78.0–100.0)
PLATELETS: 319 10*3/uL (ref 150–400)
RBC: 5.01 MIL/uL (ref 3.87–5.11)
RDW: 12.3 % (ref 11.5–15.5)
WBC: 10.9 10*3/uL — AB (ref 4.0–10.5)

## 2016-01-11 LAB — COMPREHENSIVE METABOLIC PANEL
ALK PHOS: 51 U/L (ref 38–126)
ALT: 114 U/L — AB (ref 14–54)
AST: 64 U/L — AB (ref 15–41)
Albumin: 4.3 g/dL (ref 3.5–5.0)
Anion gap: 12 (ref 5–15)
BILIRUBIN TOTAL: 0.9 mg/dL (ref 0.3–1.2)
BUN: 13 mg/dL (ref 6–20)
CALCIUM: 9.3 mg/dL (ref 8.9–10.3)
CO2: 24 mmol/L (ref 22–32)
CREATININE: 0.77 mg/dL (ref 0.44–1.00)
Chloride: 102 mmol/L (ref 101–111)
GFR calc Af Amer: >60 mL/min (ref >60)
GLUCOSE: 85 mg/dL (ref 65–99)
POTASSIUM: 4.2 mmol/L (ref 3.5–5.1)
Sodium: 138 mmol/L (ref 135–145)
TOTAL PROTEIN: 7.3 g/dL (ref 6.5–8.1)

## 2016-01-11 LAB — URINALYSIS, ROUTINE W REFLEX MICROSCOPIC
BILIRUBIN URINE: NEGATIVE
Glucose, UA: NEGATIVE mg/dL
KETONES UR: NEGATIVE mg/dL
NITRITE: NEGATIVE
PH: 5.5 (ref 5.0–8.0)
PROTEIN: NEGATIVE mg/dL
Specific Gravity, Urine: 1.022 (ref 1.005–1.030)

## 2016-01-11 LAB — URINE MICROSCOPIC-ADD ON: Bacteria, UA: NONE SEEN

## 2016-01-11 LAB — LIPASE, BLOOD: Lipase: 26 U/L (ref 11–51)

## 2016-01-11 LAB — POC URINE PREG, ED: Preg Test, Ur: NEGATIVE

## 2016-01-11 MED ORDER — TRAMADOL HCL 50 MG PO TABS: 50.0000 mg | ORAL_TABLET | Freq: Four times a day (QID) | ORAL | Status: DC | PRN

## 2016-01-11 MED ORDER — ONDANSETRON HCL 4 MG PO TABS: 4.0000 mg | ORAL_TABLET | Freq: Four times a day (QID) | ORAL | Status: DC

## 2016-01-11 NOTE — ED Notes (Signed)
Unable to collect labs PT not in rm 

## 2016-01-11 NOTE — Discharge Instructions (Signed)
Please follow-up with gastroenterologist for further evaluation and management.  Abdominal Pain, Adult Many things can cause abdominal pain. Usually, abdominal pain is not caused by a disease and will improve without treatment. It can often be observed and treated at home. Your health care provider will do a physical exam and possibly order blood tests and X-rays to help determine the seriousness of your pain. However, in many cases, more time must pass before a clear cause of the pain can be found. Before that point, your health care provider may not know if you need more testing or further treatment. HOME CARE INSTRUCTIONS Monitor your abdominal pain for any changes. The following actions may help to alleviate any discomfort you are experiencing:  Only take over-the-counter or prescription medicines as directed by your health care provider.  Do not take laxatives unless directed to do so by your health care provider.  Try a clear liquid diet (broth, tea, or water) as directed by your health care provider. Slowly move to a bland diet as tolerated. SEEK MEDICAL CARE IF:  You have unexplained abdominal pain.  You have abdominal pain associated with nausea or diarrhea.  You have pain when you urinate or have a bowel movement.  You experience abdominal pain that wakes you in the night.  You have abdominal pain that is worsened or improved by eating food.  You have abdominal pain that is worsened with eating fatty foods.  You have a fever. SEEK IMMEDIATE MEDICAL CARE IF:  Your pain does not go away within 2 hours.  You keep throwing up (vomiting).  Your pain is felt only in portions of the abdomen, such as the right side or the left lower portion of the abdomen.  You pass bloody or black tarry stools. MAKE SURE YOU:  Understand these instructions.  Will watch your condition.  Will get help right away if you are not doing well or get worse.   This information is not intended to  replace advice given to you by your health care provider. Make sure you discuss any questions you have with your health care provider.   Document Released: 06/12/2005 Document Revised: 05/24/2015 Document Reviewed: 05/12/2013 Elsevier Interactive Patient Education [employer]2016 Elsevier Inc.

## 2016-01-11 NOTE — ED Notes (Signed)
Pt states her abd pain had started about 2 weeks ago.  States it started in RUQ and saw her PMD about a week ago for it.  Elevated liver enzymes found at that visit.  PMD told her to go to the ED if pain worsened.  States that today the pain is starting to radiate from her RUQ around to her back.  She has been informed to stay NPO for now.  Fiance is at the bedside.

## 2016-01-11 NOTE — ED Notes (Signed)
RUQ pain x 2 weeks with abdominal bloating. PCP office found elevated liver functions, US scheduled for next week. Pt states the pain and nausea has become unbearable with pain moving in between shoulder blades.

## 2016-01-11 NOTE — ED Provider Notes (Signed)
CSN: [id number]     Arrival date & time 01/11/16  1236 History   None    Chief Complaint  Patient presents with  . Abdominal Pain  . Nausea    HPI   25 year old female presents today with complaints of right upper quadrant abdominal pain. Patient reports that approximately 2 weeks ago she started developing an achy pain in her right upper quadrant. She notes this becomes sharp and more painful after eating, not worse with drinking. Patient denies any fever, chills,, she reports some nausea but denies any vomiting. Patient reports she has been able tolerate small amounts of food and drink without difficulty. Patient reports the pain radiates to her back, she denies any lower abdominal pain. Patient does report that she's had some diarrhea today. Patient denies any fever, confusion, or history of the same. She reports a history of appendectomy.Patient denies any recent travel or exposure happened of drink. Patient reports that she followed up with her primary care provider who tested for her for hepatitis which was negative.     Past Medical History  Diagnosis Date  . Headache    Past Surgical History  Procedure Laterality Date  . Cyst excision  age 82    neck  . Appendectomy     Family History  Problem Relation Age of Onset  . Healthy Mother   . Healthy Father    Social History  Substance Use Topics  . Smoking status: Never Smoker   . Smokeless tobacco: None  . Alcohol Use: 0.0 oz/week    0 Standard drinks or equivalent per week     Comment: once a week   OB History    Gravida Para Term Preterm AB TAB SAB Ectopic Multiple Living   0 0 0 0 0 0 0 0 0 0       Obstetric Comments   Abdominal pain right side 5/10. Patient had just taken hydrocodone at 1400 - 1 tab.      Review of Systems  All other systems reviewed and are negative.    Allergies  Cabbage  Home Medications   Prior to Admission medications   Medication Sig Start Date End Date Taking? Authorizing Provider   aspirin-acetaminophen-caffeine (EXCEDRIN MIGRAINE) [phone] MG tablet Take 2 tablets by mouth every 6 (six) hours as needed for headache.   Yes Historical Provider, MD  cetirizine (ZYRTEC) 10 MG tablet Take 10 mg by mouth daily.   Yes Historical Provider, MD  diphenhydrAMINE (BENADRYL) 25 MG tablet Take 25 mg by mouth every 6 (six) hours as needed for allergies.   Yes Historical Provider, MD  ibuprofen (ADVIL,MOTRIN) 200 MG tablet Take 400 mg by mouth every 6 (six) hours as needed for headache.   Yes Historical Provider, MD  Norgestimate-Ethinyl Estradiol Triphasic 0.18/0.215/0.25 MG-25 MCG tab Take 1 tablet by mouth daily. 12/11/15  Yes Historical Provider, MD  pseudoephedrine (SUDAFED) 30 MG tablet Take 30 mg by mouth every 4 (four) hours as needed for congestion.   Yes Historical Provider, MD  HYDROcodone-acetaminophen (NORCO/VICODIN) 5-325 MG tablet Take 1-2 tablets by mouth every 6 (six) hours as needed for severe pain. Patient not taking: Reported on 01/11/2016 07/15/15   Dorathy KinsmanVirginia Smith, CNM  ibuprofen (ADVIL,MOTRIN) 600 MG tablet Take 1 tablet (600 mg total) by mouth every 6 (six) hours as needed for moderate pain. Patient not taking: Reported on 01/11/2016 07/15/15   Dorathy KinsmanVirginia Smith, CNM  megestrol (MEGACE) 40 MG tablet Take 1 tablet (40 mg total) by mouth daily. Patient  not taking: Reported on 01/11/2016 07/15/15   Dorathy Kinsman, CNM  ondansetron (ZOFRAN) 4 MG tablet Take 1 tablet (4 mg total) by mouth every 6 (six) hours. 01/11/16   Eyvonne Mechanic, PA-C  traMADol (ULTRAM) 50 MG tablet Take 1 tablet (50 mg total) by mouth every 6 (six) hours as needed. 01/11/16   Eyvonne Mechanic, PA-C   BP 127/84 mmHg  Pulse 88  Temp(Src) 98.7 F (37.1 C) (Oral)  Resp 20  SpO2 99%   Physical Exam  Constitutional: She is oriented to person, place, and time. She appears well-developed and well-nourished.  Nontoxic in no acute distress, no signs of jaundice  HENT:  Head: Normocephalic and atraumatic.   Eyes: Conjunctivae are normal. Pupils are equal, round, and reactive to light. Right eye exhibits no discharge. Left eye exhibits no discharge. No scleral icterus.  Neck: Normal range of motion. No JVD present. No tracheal deviation present.  Pulmonary/Chest: Effort normal. No stridor.  Abdominal:  right upper quadrant pain to palpation  Musculoskeletal: Normal range of motion. She exhibits no edema or tenderness.  Neurological: She is alert and oriented to person, place, and time. Coordination normal.  Skin: Skin is warm and dry. No rash noted. No erythema. No pallor.  Psychiatric: She has a normal mood and affect. Her behavior is normal. Judgment and thought content normal.  Nursing note and vitals reviewed.   ED Course  Procedures (including critical care time) Labs Review Labs Reviewed  COMPREHENSIVE METABOLIC PANEL - Abnormal; Notable for the following:    AST 64 (*)    ALT 114 (*)    All other components within normal limits  CBC - Abnormal; Notable for the following:    WBC 10.9 (*)    All other components within normal limits  URINALYSIS, ROUTINE W REFLEX MICROSCOPIC (NOT AT [facility]) - Abnormal; Notable for the following:    Color, Urine AMBER (*)    APPearance CLOUDY (*)    Hgb urine dipstick SMALL (*)    Leukocytes, UA SMALL (*)    All other components within normal limits  URINE MICROSCOPIC-ADD ON - Abnormal; Notable for the following:    Squamous Epithelial / LPF 0-5 (*)    All other components within normal limits  LIPASE, BLOOD  POC URINE PREG, ED    Imaging Review US Abdomen Limited Ruq  01/11/2016  CLINICAL DATA:  Right upper quadrant pain for several weeks EXAM: US ABDOMEN LIMITED - RIGHT UPPER QUADRANT COMPARISON:  None. FINDINGS: Gallbladder: No gallstones or wall thickening visualized. No sonographic Murphy sign noted. Common bile duct: Diameter: 3.6 mm Liver: Diffusely increased in echogenicity without focal mass. Vertical length of the liver is 22.6 cm.  IMPRESSION: Hepatomegaly with diffuse hepatic steatosis Normal gallbladder and biliary tree. Electronically Signed   By: Jolaine Click M.D.   On: 01/11/2016 15:54   I have personally reviewed and evaluated these images and lab results as part of my medical decision-making.   EKG Interpretation None      MDM   Final diagnoses:  RUQ pain    Labs:Lipase, CMP, CBC, urinalysis, urine pregnancy  Imaging: Right upper quadrant ultrasound  Consults:  Therapeutics:  Discharge Meds: Zofran, Percocet  Assessment/Plan:25 year old female presents today with right upper quadrant pain. Patient has no significant elevations in liver function tests with AST at 64 ALT of 114. Patient has a WBC of 10.9, she is afebrile, nontoxic in no acute distress. Patient is tolerating by mouth without difficulty. Patient's ultrasound noted above, she  will be referred to gastroenterology for further evaluation and management. Patient given strict return precautions, she verbalized her understanding and agreement to today's plan.         Eyvonne Mechanic, PA-C 01/11/16 1950  Nelva Nay, MD 01/11/16 [phone]

## 2016-01-16 ENCOUNTER — Other Ambulatory Visit (HOSPITAL_COMMUNITY): Payer: Self-pay | Admitting: Physician Assistant

## 2016-01-16 ENCOUNTER — Other Ambulatory Visit: Payer: BC Managed Care – PPO

## 2016-01-16 DIAGNOSIS — R1011 Right upper quadrant pain: Secondary | ICD-10-CM

## 2016-01-22 ENCOUNTER — Ambulatory Visit (HOSPITAL_COMMUNITY)
Admission: RE | Admit: 2016-01-22 | Discharge: 2016-01-22 | Disposition: A | Discharge status: Home / Self Care | Payer: BC Managed Care – PPO | Source: Ambulatory Visit | Attending: Physician Assistant | Admitting: Physician Assistant

## 2016-01-22 DIAGNOSIS — R1011 Right upper quadrant pain: Secondary | ICD-10-CM | POA: Diagnosis present

## 2016-01-22 MED ORDER — TECHNETIUM TC 99M MEBROFENIN IV KIT: 5.1000 | PACK | Freq: Once | INTRAVENOUS | Status: DC | PRN

## 2016-02-15 ENCOUNTER — Ambulatory Visit: Payer: Self-pay | Admitting: General Surgery

## 2016-02-15 HISTORY — PX: CHOLECYSTECTOMY: SHX55

## 2016-02-15 NOTE — H&P (Signed)
History of Present Illness Catherine Christian(Keona Sheffler MD; 02/15/2016 9:54 AM) The patient is a 25 year old female who presents for evaluation of gall stones. The patient is a 25 year old female who is referred by Dr. Chrissie NoaWilliam outlaw for right upper quadrant pain that radiates to the back. She states that the pain is usually after high fatty meals. She states that she is having low-fat diet recently which has helped some. She states this is a severe with nausea and vomiting.  Patient has had an EGD which revealed some gastritis. She also had a HIDA scan with normal ejection fraction. Patient's had previous LFTs on 01/11/16 which have revealed elevated AST and ALT. Patient did have a ultrasound by the EDP which revealed no stones.   Other Problems Catherine Christian(Ashley Beck, CMA; 02/15/2016 9:30 AM) Migraine Headache  Past Surgical History Catherine Christian(Ashley Beck, CMA; 02/15/2016 9:30 AM) Appendectomy  Diagnostic Studies History Catherine Christian(Ashley Beck, CMA; 02/15/2016 9:30 AM) Colonoscopy 5-10 years ago Mammogram never Pap Smear 1-5 years ago  Allergies Catherine Christian(Ashley Beck, CMA; 02/15/2016 9:31 AM) CABBAGE  Medication History Catherine Christian(Ashley Beck, CMA; 02/15/2016 9:34 AM) PriLOSEC (10MG  Capsule DR, Oral) Active. Medications Reconciled Norgestim-Eth Estrad Triphasic (0.18/0.215/0.25MG -25 MCG Tablet, Oral) Active.  Social History Catherine Christian([place]; 02/15/2016 9:30 AM) Alcohol use Moderate alcohol use. Caffeine use Carbonated beverages, Coffee. No drug use Tobacco use Never smoker.  Family History Catherine Christian(Ashley Beck, [place]; 02/15/2016 9:30 AM) First Degree Relatives No pertinent family history  Pregnancy / Birth History Catherine Christian(Ashley Beck, CMA; 02/15/2016 9:30 AM) Age at menarche 12 years. Contraceptive History Oral contraceptives. Regular periods    Review of Systems Catherine Christian(Ashley Beck CMA; 02/15/2016 9:30 AM) General Present- Appetite Loss. Not Present- Chills, Fatigue, Fever, Night Sweats, Weight Gain and Weight Loss. Skin Not Present- Change in  Wart/Mole, Dryness, Hives, Jaundice, New Lesions, Non-Healing Wounds, Rash and Ulcer. HEENT Present- Seasonal Allergies and Sinus Pain. Not Present- Earache, Hearing Loss, Hoarseness, Nose Bleed, Oral Ulcers, Ringing in the Ears, Sore Throat, Visual Disturbances, Wears glasses/contact lenses and Yellow Eyes. Respiratory Present- Snoring. Not Present- Bloody sputum, Chronic Cough, Difficulty Breathing and Wheezing. Cardiovascular Not Present- Chest Pain, Difficulty Breathing Lying Down, Leg Cramps, Palpitations, Rapid Heart Rate, Shortness of Breath and Swelling of Extremities. Gastrointestinal Present- Abdominal Pain, Change in Bowel Habits and Nausea. Not Present- Bloating, Bloody Stool, Chronic diarrhea, Constipation, Difficulty Swallowing, Excessive gas, Gets full quickly at meals, Hemorrhoids, Indigestion, Rectal Pain and Vomiting. Female Genitourinary Not Present- Frequency, Nocturia, Painful Urination, Pelvic Pain and Urgency. Musculoskeletal Not Present- Back Pain, Joint Pain, Joint Stiffness, Muscle Pain, Muscle Weakness and Swelling of Extremities. Neurological Present- Headaches. Not Present- Decreased Memory, Fainting, Numbness, Seizures, Tingling, Tremor, Trouble walking and Weakness. Psychiatric Not Present- Anxiety, Bipolar, Change in Sleep Pattern, Depression, Fearful and Frequent crying. Endocrine Not Present- Cold Intolerance, Excessive Hunger, Hair Changes, Heat Intolerance, Hot flashes and New Diabetes. Hematology Not Present- Easy Bruising, Excessive bleeding, Gland problems, HIV and Persistent Infections.  Vitals Catherine Christian(Ashley Beck CMA; 02/15/2016 9:32 AM) 02/15/2016 9:32 AM Weight: 235 lb Height: 64in Body Surface Area: 2.09 m Body Mass Index: 40.34 kg/m  Temp.: 97.76F(Temporal)  Pulse: 74 (Regular)  BP: 130/78 (Sitting, Left Arm, Standard)       Physical Exam Catherine Christian(Adaeze Better, MD; 02/15/2016 9:54 AM) General Mental Status-Alert. General Appearance-Consistent  with stated age. Hydration-Well hydrated. Voice-Normal.  Head and Neck Head-normocephalic, atraumatic with no lesions or palpable masses.  Eye Eyeball - Bilateral-Extraocular movements intact. Sclera/Conjunctiva - Bilateral-No scleral icterus.  Chest and Lung Exam Chest and lung exam  reveals -quiet, even and easy respiratory effort with no use of accessory muscles. Inspection Chest Wall - Normal. Back - normal.  Cardiovascular Cardiovascular examination reveals -normal heart sounds, regular rate and rhythm with no murmurs.  Abdomen Inspection Normal Exam - No Hernias. Palpation/Percussion Normal exam - Soft, Non Tender, No Rebound tenderness, No Rigidity (guarding) and No hepatosplenomegaly. Auscultation Normal exam - Bowel sounds normal.  Neurologic Neurologic evaluation reveals -alert and oriented x 3 with no impairment of recent or remote memory. Mental Status-Normal.  Musculoskeletal Normal Exam - Left-Upper Extremity Strength Normal and Lower Extremity Strength Normal. Normal Exam - Right-Upper Extremity Strength Normal, Lower Extremity Weakness.    Assessment & Plan Catherine Filler MD; 02/15/2016 9:54 AM) SYMPTOMATIC CHOLELITHIASIS (K80.20) Impression: 25 year old female with likely symptomatic stones. 1. We will proceed to the operating room for a laparoscopic cholecystectomy 2. Risks and benefits were discussed with the patient to generally include, but not limited to: infection, bleeding, possible need for post op ERCP, damage to the bile ducts, bile leak, and possible need for further surgery. Alternatives were offered and described. All questions were answered and the patient voiced understanding of the procedure and wishes to proceed at this point with a laparoscopic cholecystectomy

## 2016-02-28 ENCOUNTER — Other Ambulatory Visit: Payer: Self-pay | Admitting: General Surgery

## 2016-12-09 ENCOUNTER — Encounter: Payer: Self-pay | Admitting: Neurology

## 2017-02-17 ENCOUNTER — Ambulatory Visit (INDEPENDENT_AMBULATORY_CARE_PROVIDER_SITE_OTHER): Payer: BC Managed Care – PPO | Admitting: Neurology

## 2017-02-17 ENCOUNTER — Encounter: Payer: Self-pay | Admitting: Neurology

## 2017-02-17 VITALS — BP 104/68 | HR 88 | Ht 64.0 in | Wt 248.0 lb

## 2017-02-17 DIAGNOSIS — G5603 Carpal tunnel syndrome, bilateral upper limbs: Secondary | ICD-10-CM | POA: Diagnosis not present

## 2017-02-17 DIAGNOSIS — G43709 Chronic migraine without aura, not intractable, without status migrainosus: Secondary | ICD-10-CM

## 2017-02-17 MED ORDER — SUMATRIPTAN SUCCINATE 100 MG PO TABS: ORAL_TABLET | ORAL | 3 refills | Status: DC

## 2017-02-17 MED ORDER — PROPRANOLOL HCL ER 80 MG PO CP24: 80.0000 mg | ORAL_CAPSULE | Freq: Every day | ORAL | 3 refills | Status: DC

## 2017-02-17 NOTE — Patient Instructions (Signed)
Migraine Recommendations: 1.  Increase propranolol ER 80mg  daily.  Call in 4 weeks with update and we can adjust dose if needed.  Monitor for lightheadedness. 2.  Take sumatriptan 100mg  at earliest onset of headache.  May repeat dose once in 2 hours if needed.  Do not exceed two tablets in 24 hours. 3.  Limit use of pain relievers to no more than 2 days out of the week.  These medications include acetaminophen, ibuprofen, triptans and narcotics.  This will help reduce risk of rebound headaches. 4.  Be aware of common food triggers such as processed sweets, processed foods with nitrites (such as deli meat, hot dogs, sausages), foods with MSG, alcohol (such as wine), chocolate, certain cheeses, certain fruits (dried fruits, some citrus fruit), vinegar, diet soda. 4.  Avoid caffeine 5.  Routine exercise 6.  Proper sleep hygiene 7.  Stay adequately hydrated with water 8.  Keep a headache diary. 9.  Maintain proper stress management. 10.  Do not skip meals. 11.  Consider supplements:  Magnesium citrate 400mg  to 600mg  daily, riboflavin 400mg , Coenzyme Q 10 100mg  three times daily 12.  Make these lifestyle changes that we spoke about.  Contact me in 4 to 6 weeks with update and we can increase dose of propranolol if needed.   Migraine Headache A migraine headache is an intense, throbbing pain on one side or both sides of the head. Migraines may also cause other symptoms, such as nausea, vomiting, and sensitivity to light and noise. What are the causes? Doing or taking certain things may also trigger migraines, such as:  Alcohol.  Smoking.  Medicines, such as: ? Medicine used to treat chest pain (nitroglycerine). ? Birth control pills. ? Estrogen pills. ? Certain blood pressure medicines.  Aged cheeses, chocolate, or caffeine.  Foods or drinks that contain nitrates, glutamate, aspartame, or tyramine.  Physical activity.  Other things that may trigger a migraine  include:  Menstruation.  Pregnancy.  Hunger.  Stress, lack of sleep, too much sleep, or fatigue.  Weather changes.  What increases the risk? The following factors may make you more likely to experience migraine headaches:  Age. Risk increases with age.  Family history of migraine headaches.  Being Caucasian.  Depression and anxiety.  Obesity.  Being a woman.  Having a hole in the heart (patent foramen ovale) or other heart problems.  What are the signs or symptoms? The main symptom of this condition is pulsating or throbbing pain. Pain may:  Happen in any area of the head, such as on one side or both sides.  Interfere with daily activities.  Get worse with physical activity.  Get worse with exposure to bright lights or loud noises.  Other symptoms may include:  Nausea.  Vomiting.  Dizziness.  General sensitivity to bright lights, loud noises, or smells.  Before you get a migraine, you may get warning signs that a migraine is developing (aura). An aura may include:  Seeing flashing lights or having blind spots.  Seeing bright spots, halos, or zigzag lines.  Having tunnel vision or blurred vision.  Having numbness or a tingling feeling.  Having trouble talking.  Having muscle weakness.  How is this diagnosed? A migraine headache can be diagnosed based on:  Your symptoms.  A physical exam.  Tests, such as CT scan or MRI of the head. These imaging tests can help rule out other causes of headaches.  Taking fluid from the spine (lumbar puncture) and analyzing it (cerebrospinal fluid analysis,  or CSF analysis).  How is this treated? A migraine headache is usually treated with medicines that:  Relieve pain.  Relieve nausea.  Prevent migraines from coming back.  Treatment may also include:  Acupuncture.  Lifestyle changes like avoiding foods that trigger migraines.  Follow these instructions at home: Medicines  Take over-the-counter  and prescription medicines only as told by your health care provider.  Do not drive or use heavy machinery while taking prescription pain medicine.  To prevent or treat constipation while you are taking prescription pain medicine, your health care provider may recommend that you: ? Drink enough fluid to keep your urine clear or pale yellow. ? Take over-the-counter or prescription medicines. ? Eat foods that are high in fiber, such as fresh fruits and vegetables, whole grains, and beans. ? Limit foods that are high in fat and processed sugars, such as fried and sweet foods. Lifestyle  Avoid alcohol use.  Do not use any products that contain nicotine or tobacco, such as cigarettes and e-cigarettes. If you need help quitting, ask your health care provider.  Get at least 8 hours of sleep every night.  Limit your stress. General instructions   Keep a journal to find out what may trigger your migraine headaches. For example, write down: ? What you eat and drink. ? How much sleep you get. ? Any change to your diet or medicines.  If you have a migraine: ? Avoid things that make your symptoms worse, such as bright lights. ? It may help to lie down in a dark, quiet room. ? Do not drive or use heavy machinery. ? Ask your health care provider what activities are safe for you while you are experiencing symptoms.  Keep all follow-up visits as told by your health care provider. This is important. Contact a health care provider if:  You develop symptoms that are different or more severe than your usual migraine symptoms. Get help right away if:  Your migraine becomes severe.  You have a fever.  You have a stiff neck.  You have vision loss.  Your muscles feel weak or like you cannot control them.  You start to lose your balance often.  You develop trouble walking.  You faint. This information is not intended to replace advice given to you by your health care provider. Make sure  you discuss any questions you have with your health care provider. Document Released: 09/02/2005 Document Revised: 03/22/2016 Document Reviewed: 02/19/2016 Elsevier Interactive Patient Education  2017 [employer].

## 2017-02-17 NOTE — Progress Notes (Signed)
NEUROLOGY CONSULTATION NOTE  Jeannie DoneMarissa Littler MRN: [id number] DOB: Oct 08, 1990  Referring provider: Dr. Wynelle LinkSun Primary care provider: Dr. Wynelle LinkSun  Reason for consult:  headache  HISTORY OF PRESENT ILLNESS: Roosvelt MaserMarissa Christian is a 26 year old female with migraine who presents for migraines.  History supplemented by internal medicine note.  Onset:  Since age 26. Location:  Left sided Quality:  Throbbing, sharp Intensity:  7/10 Aura:  no Prodrome:  no Postdrome:  no Associated symptoms:  Nausea, photophobia, neck pain.   Frequency:  Several hours (3 hours with sumatriptan 50mg ) Frequency of abortive medication: takes something daily Triggers/exacerbating factors:  talking, laughing, change in weather Relieving factors:  eyes closed, no talking, wet compress on eyes Activity:  Aggravates.  Every few months she has a intractable migraine lasting over a day in which she needs to call out of work.  In April, she had a different headache.  It occurred two days after a typical migraine.  She had sudden onset exploding headache, 9/10, associated with word-finding difficulty, some nausea and seeing spots, but no vomiting, photophobia, slurred speech or unilateral numbness or weakness.  It lasted several hours until she was treated in Urgent Care with a Toradol shot.  She has not had a recurrence.  She also has daily dull holocephalic headache, for which she takes Excedrin or ibuprofen daily.  She reports neck pain.  She wakes up from sleep with numbness in the hands.  Past NSAIDS:  no Past analgesics:  Tylenol Past abortive triptans:  no Past muscle relaxants:  no Past anti-emetic:  no Past antihypertensive medications:  no Past antidepressant medications:  no Past anticonvulsant medications:  topiramate 50mg  Past vitamins/Herbal/Supplements:  no Other past therapies:  no  Current NSAIDS:  ibuprofen Current analgesics:  Excedrin Current triptans:  sumatriptan 50mg  Current  anti-emetic:  promethazine 25mg  PR Current muscle relaxants:  no Current anti-anxiolytic:  no Current sleep aide:  no Current Antihypertensive medications:  propranolol ER 7730m Current Antidepressant medications:  no Current Anticonvulsant medications:  no Current Vitamins/Herbal/Supplements:  no Current Antihistamines/Decongestants:  Sudafed, Zyrtec, Benadryl Other therapy:  no Birth control: Nexplanon  Caffeine:  Coffee not daily.  Drinks Coke when she has a migraine Alcohol:  seldom Smoker:  no Diet:  Hydrates.  Needs to improve diet. Exercise:  no Depression/anxiety:  okay Sleep hygiene:  good Family history of headache:  mom  PAST MEDICAL HISTORY: Past Medical History:  Diagnosis Date  . Headache     PAST SURGICAL HISTORY: Past Surgical History:  Procedure Laterality Date  . APPENDECTOMY    . CYST EXCISION  age 8   neck    MEDICATIONS: Current Outpatient Prescriptions on File Prior to Visit  Medication Sig Dispense Refill  . cetirizine (ZYRTEC) 10 MG tablet Take 10 mg by mouth daily.    . diphenhydrAMINE (BENADRYL) 25 MG tablet Take 25 mg by mouth every 6 (six) hours as needed for allergies.    [name]. ibuprofen (ADVIL,MOTRIN) 200 MG tablet Take 400 mg by mouth every 6 (six) hours as needed for headache.    . pseudoephedrine (SUDAFED) 30 MG tablet Take 30 mg by mouth every 4 (four) hours as needed for congestion.    [name]. aspirin-acetaminophen-caffeine (EXCEDRIN MIGRAINE) 250-250-65 MG tablet Take 2 tablets by mouth every 6 (six) hours as needed for headache.     No current facility-administered medications on file prior to visit.     ALLERGIES: Allergies  Allergen Reactions  . Cabbage Nausea Only  FAMILY HISTORY: Family History  Problem Relation Age of Onset  . Healthy Mother   . Healthy Father     SOCIAL HISTORY: Social History   Social History  . Marital status: Single    Spouse name: N/A  . Number of children: N/A  . Years of education: N/A    Occupational History  . Not on file.   Social History Main Topics  . Smoking status: Never Smoker  . Smokeless tobacco: Never Used  . Alcohol use 0.0 oz/week     Comment: once a week  . Drug use: No  . Sexual activity: No   Other Topics Concern  . Not on file   Social History Narrative   She is a Pension scheme manager.   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   She lives with twin sister.     REVIEW OF SYSTEMS: Constitutional: No fevers, chills, or sweats, no generalized fatigue, change in appetite Eyes: No visual changes, double vision, eye pain Ear, nose and throat: No hearing loss, ear pain, nasal congestion, sore throat Cardiovascular: No chest pain, palpitations Respiratory:  No shortness of breath at rest or with exertion, wheezes GastrointestinaI: No nausea, vomiting, diarrhea, abdominal pain, fecal incontinence Genitourinary:  No dysuria, urinary retention or frequency Musculoskeletal:  No neck pain, back pain Integumentary: No rash, pruritus, skin lesions Neurological: as above Psychiatric: No depression, insomnia, anxiety Endocrine: No palpitations, fatigue, diaphoresis, mood swings, change in appetite, change in weight, increased thirst Hematologic/Lymphatic:  No purpura, petechiae. Allergic/Immunologic: no itchy/runny eyes, nasal congestion, recent allergic reactions, rashes  PHYSICAL EXAM: Vitals:   02/17/17 1511  BP: 104/68  Pulse: 88   General: No acute distress.  Patient appears well-groomed.  Morbidly bese Head:  Normocephalic/atraumatic Eyes:  fundi examined but not visualized Neck: supple, no paraspinal tenderness, full range of motion Back: No paraspinal tenderness Heart: regular rate and rhythm Lungs: Clear to auscultation bilaterally. Vascular: No carotid bruits. Neurological Exam: Mental status: alert and oriented to person, place, and time, recent and remote memory intact, fund of knowledge intact, attention and concentration intact,  speech fluent and not dysarthric, language intact. Cranial nerves: CN I: not tested CN II: pupils equal, round and reactive to light, visual fields intact CN III, IV, VI:  full range of motion, no nystagmus, no ptosis CN V: facial sensation intact CN VII: upper and lower face symmetric CN VIII: hearing intact CN IX, X: gag intact, uvula midline CN XI: sternocleidomastoid and trapezius muscles intact CN XII: tongue midline Bulk & Tone: normal, no fasciculations. Motor:  5/5 throughout  Sensation: temperature and vibration sensation intact. Deep Tendon Reflexes:  2+ throughout, toes downgoing.  Finger to nose testing:  Without dysmetria.  Heel to shin:  Without dysmetria.  Gait:  Normal station and stride.  Able to turn and tandem walk. Romberg negative.  IMPRESSION: 1.  Chronic migraine 2.  New thunderclap headache, isolated event.  It occurred 2.5 months ago and she is well with nonfocal exam.  Therefore, I suspect migraine and not a more serious secondary etiology. 3.  Bilateral hand numbness.  She was previously evaluated for possible ulnar neuropathy but this time, it sounds like carpal tunnel syndrome. 4.  Morbid obesity with BMP over 42.  PLAN: 1.  Will increase propranolol ER to 80mg  daily.  Monitor for lightheadedness. 2.  Discussed lifestyle modification in detail (routine exercise, hydration, diet, weight loss) 3.  Increase sumatriptan to 100mg  4.  Stop all OTC medication and limit use of  sumatriptan to no more than 2 days out of the week to prevent rebound headache. 5.  Use wrist splints at night. 6.  Follow up in 4 months.  30 minutes spent face to face with patient, over 50% spent discussing management.  Thank you for allowing me to take part in the care of this patient.  Shon Millet, DO  CC:  Deatra James, MD

## 2017-04-22 IMAGING — US US PELVIS COMPLETE
1 series · 15 of 25 positions shown · non-contrast
Comparison: None.

CLINICAL DATA: Diagnosed with a 3 cm left ovarian cyst on
ultrasound [REDACTED] and a right ovarian cyst by CT on [REDACTED].

Now with right lower quadrant pain.  Rule out torsion.
Previous history of appendectomy.
Transvaginal exam was declined, patient not sexually active
EXAM:
TRANSABDOMINAL ULTRASOUND OF PELVIS
DOPPLER ULTRASOUND OF OVARIES
TECHNIQUE: Transabdominal ultrasound examination of the pelvis was performed
including evaluation of the uterus, ovaries, adnexal regions, and
pelvic cul-de-sac.
Color and duplex Doppler ultrasound was utilized to evaluate blood
flow to the ovaries.

[Series 1: us pelvis complete · 15 of 44 slices shown]
[im 1/44]
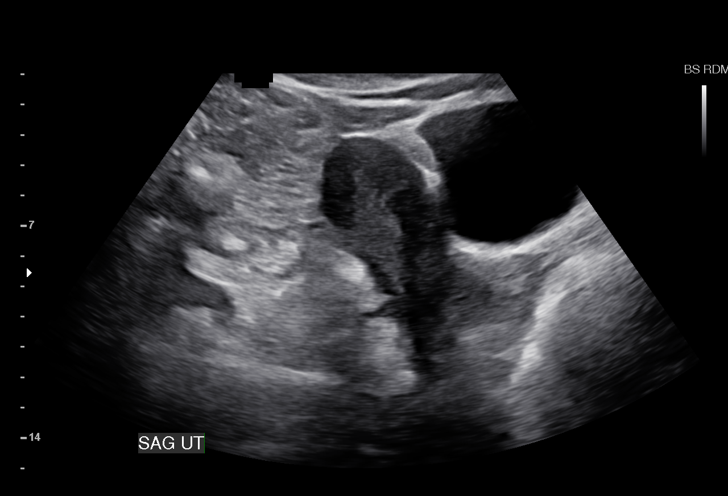
[im 4/44]
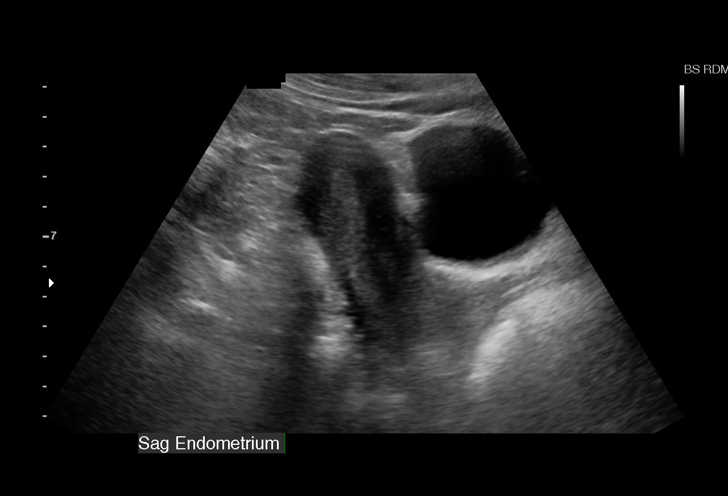
[im 8/44]
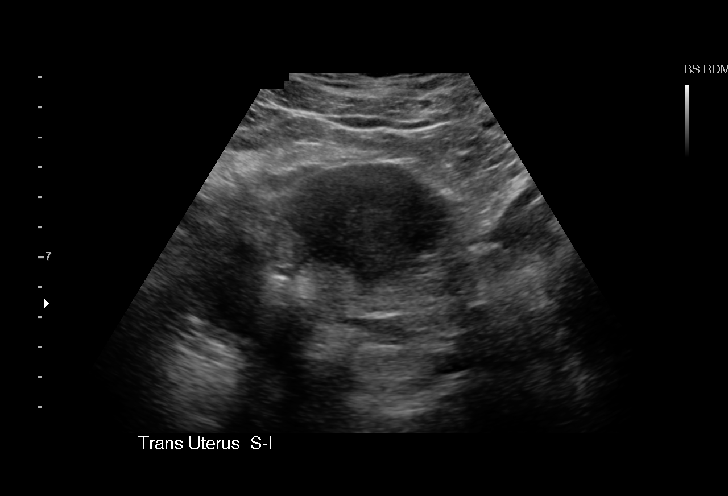
[im 9/44]
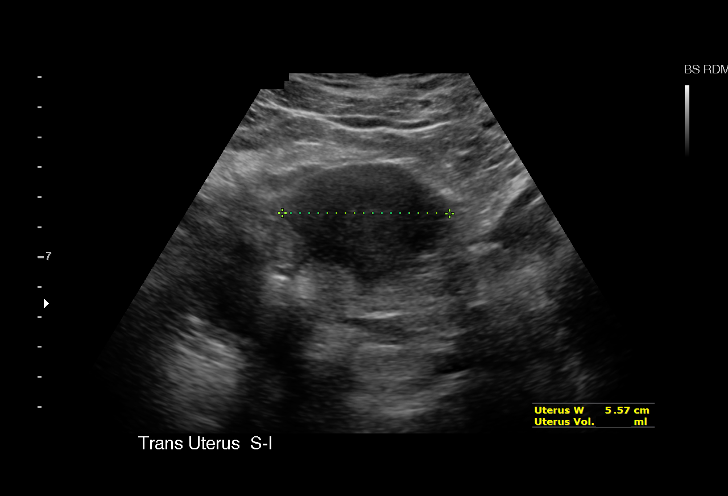
[im 13/44]
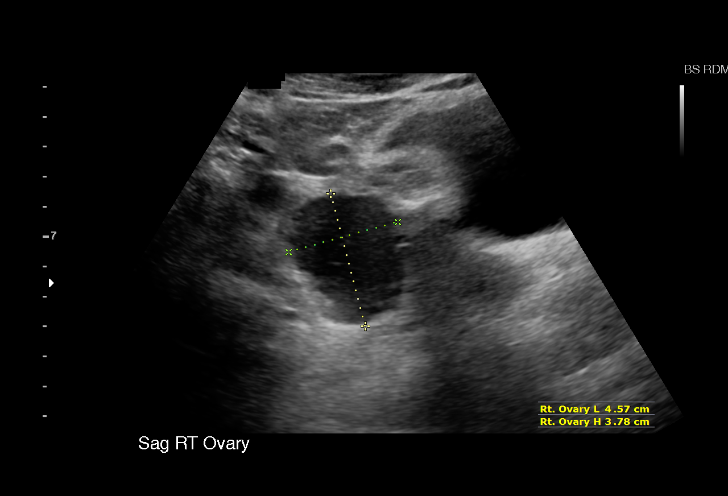
[im 17/44]
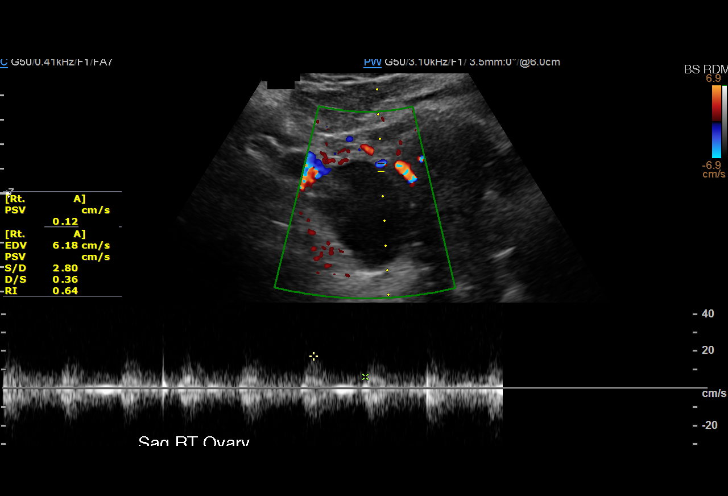
[im 18/44]
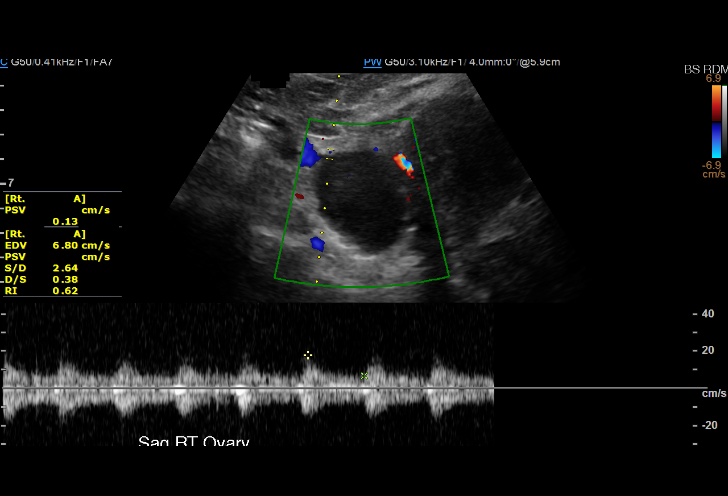
[im 22/44]
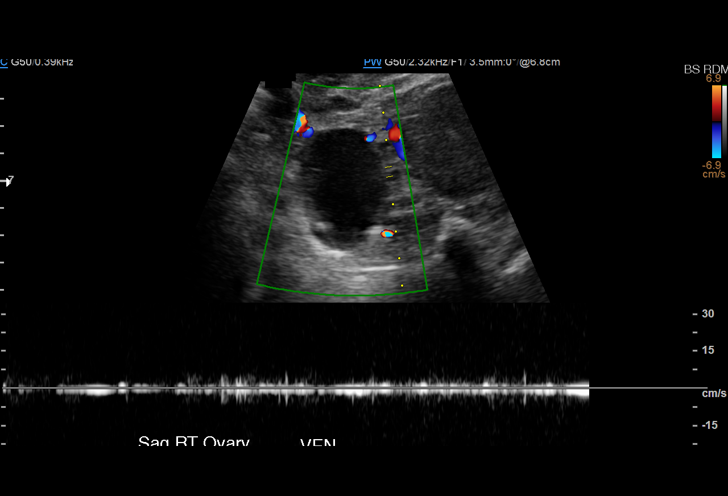
[im 26/44]
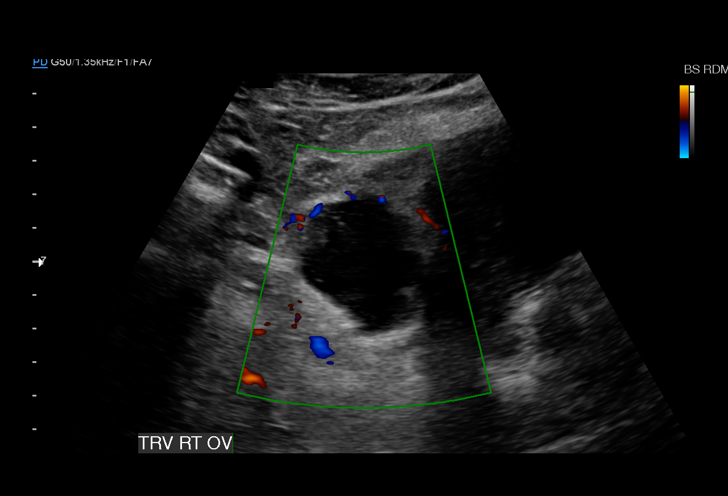
[im 27/44]
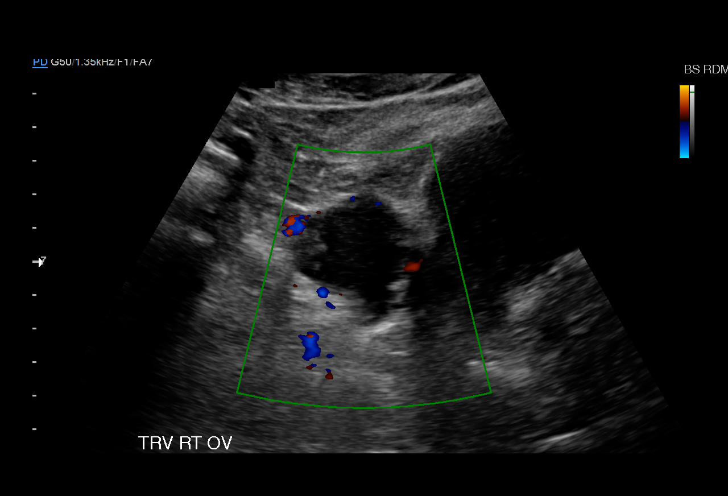
[im 31/44]
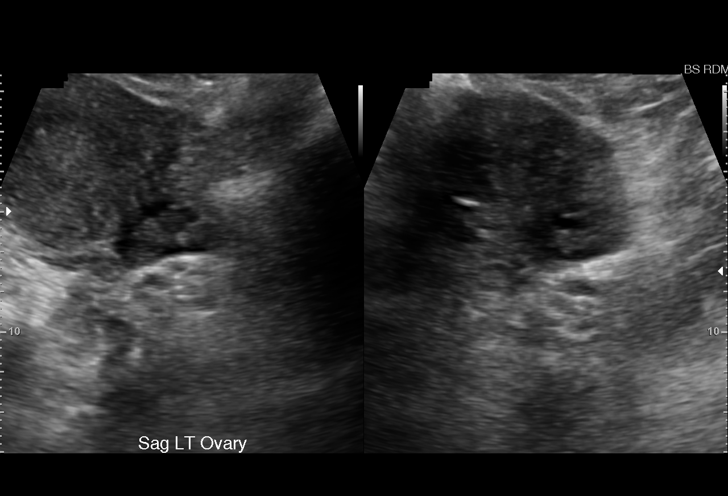
[im 35/44]
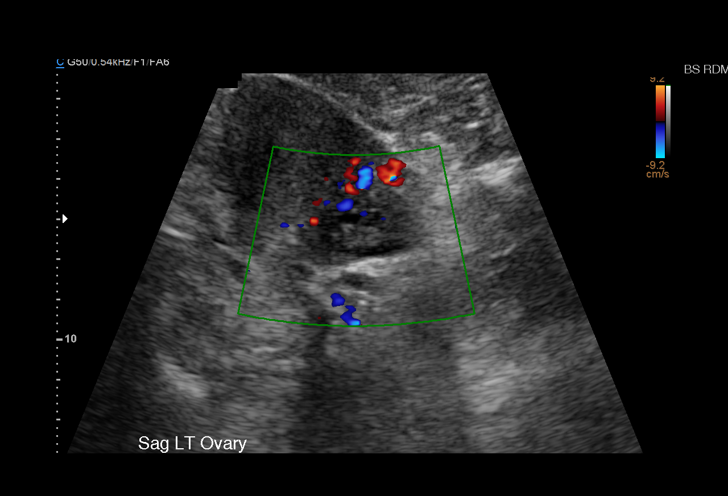
[im 36/44]
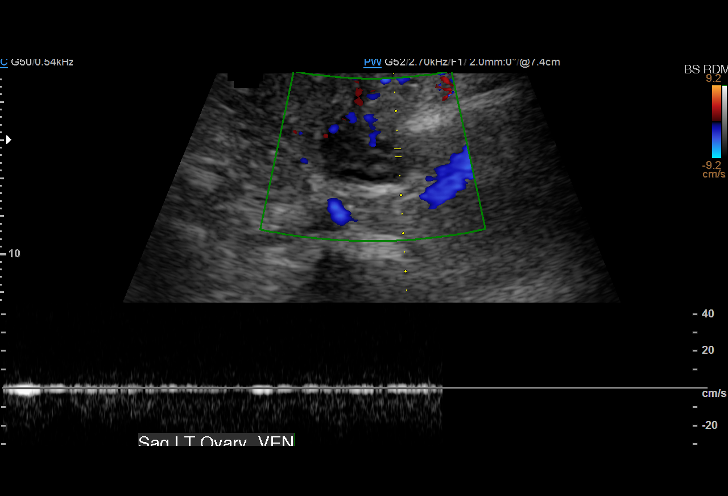
[im 40/44]
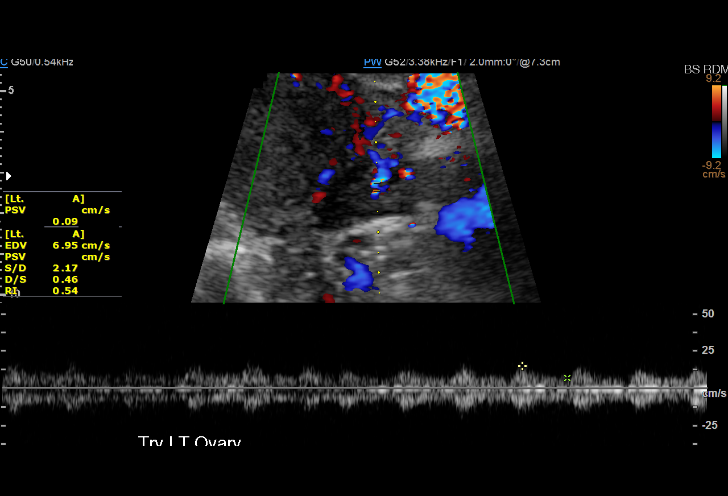
[im 44/44]
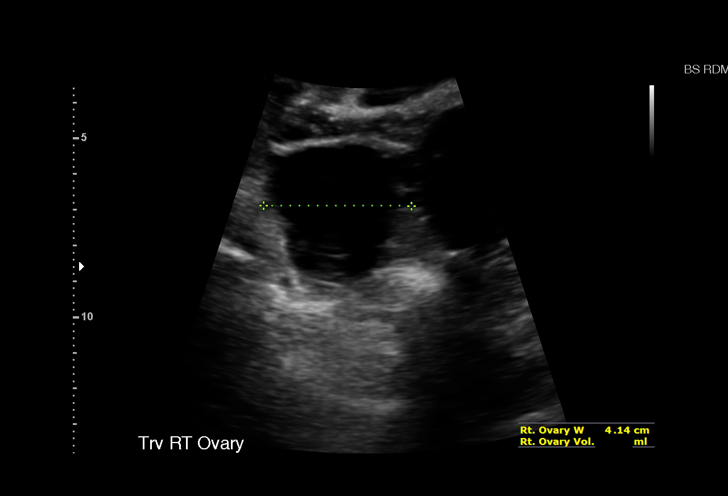

[15 of 25 positions shown; findings below may reference images not displayed]

FINDINGS: Uterus

Measurements: 9.1 x 3.6 x 5.6 cm. No fibroids or other mass
visualized.

Endometrium

Thickness: Normal at 10 mm. No mass or fluid within the endometrial
canal.

Right ovary

Measurements: 4.6 x 3.8 x 4.1 cm.. Right ovarian simple cyst
measuring 4.6 x 3 cm. Normal arterial and venous blood flow shown
within the right ovary. No mass or free fluid within the adjacent
right adnexa.

Left ovary

Measurements: 2.9 x 1.5 x 2.2 cm. Left ovary appears normal. Normal
arterial and venous blood flow shown within the left ovary. No mass
or free fluid within the adjacent left adnexa.

As above, pulsed Doppler evaluation demonstrates normal
low-resistance arterial and venous waveforms in both ovaries.
IMPRESSION: 1. Right ovarian cyst measuring 4.6 x 3 x 3.5 cm. Right ovary
otherwise unremarkable. No evidence of ovarian torsion. No mass or
free fluid within the adjacent right adnexa.
2. Uterus and left ovary appear normal.

## 2017-06-20 ENCOUNTER — Encounter: Payer: Self-pay | Admitting: Neurology

## 2017-06-20 ENCOUNTER — Ambulatory Visit (INDEPENDENT_AMBULATORY_CARE_PROVIDER_SITE_OTHER): Payer: BC Managed Care – PPO | Admitting: Neurology

## 2017-06-20 VITALS — BP 104/64 | HR 84 | Ht 68.0 in | Wt 261.8 lb

## 2017-06-20 DIAGNOSIS — G43009 Migraine without aura, not intractable, without status migrainosus: Secondary | ICD-10-CM | POA: Diagnosis not present

## 2017-06-20 MED ORDER — PROPRANOLOL HCL ER 60 MG PO CP24: 60.0000 mg | ORAL_CAPSULE | Freq: Every day | ORAL | 2 refills | Status: DC

## 2017-06-20 MED ORDER — NAPROXEN 500 MG PO TABS: 500.0000 mg | ORAL_TABLET | Freq: Two times a day (BID) | ORAL | 2 refills | Status: DC | PRN

## 2017-06-20 NOTE — Progress Notes (Signed)
NEUROLOGY FOLLOW UP OFFICE NOTE  Catherine Christian [id number]  HISTORY OF PRESENT ILLNESS: Catherine Christian is a 26 year old female with migraine who follows up for chronic migraine.  UPDATE: Migraines are much more controlled.  However, she sometimes feels dizzy since increase in propranolol.  She also has gained some weight.  She started seeing a [facility].  She has limited intake of pain relievers.  She has eliminated soda and is drinking more water.  Intensity:  7/10 Duration:  If she takes sumatriptan on-time, within 2 hours (she usually needs to repeat dose).  Often, she doesn't have it on-hand and waits to take the first dose. Frequency:  Once every 2 weeks Frequency of abortive medication: once every 2 weeks. Current NSAIDS:  Ibuprofen (rarely for back pain) Current analgesics:  no Current triptans:  sumatriptan  Current anti-emetic:  promethazine  PR Current muscle relaxants:  no Current anti-anxiolytic:  no Current sleep aide:  no Current Antihypertensive medications:  propranolol ER  Current Antidepressant medications:  no Current Anticonvulsant medications:  no Current Vitamins/Herbal/Supplements:  magnesium Current Antihistamines/Decongestants:  Sudafed, Zyrtec, Benadryl Other therapy:  Chiropractic medicine for neck. Birth control: Nexplanon   Caffeine:  no Alcohol:  seldom Smoker:  no Diet:  Hydrates.   Exercise:  no Depression/anxiety:  okay Sleep hygiene:  good  HISTORY: Onset:  Since age 79. Location:  Left sided Quality:  Throbbing, sharp Initial Intensity:  7/10 Aura:  no Prodrome:  no Postdrome:  no Associated symptoms:  Nausea, photophobia, neck pain.   Initial duration  Several hours (3 hours with sumatriptan ) Initial frequency:  daily Initial Frequency of abortive medication: takes something daily Triggers/exacerbating factors:  talking, laughing, change in weather Relieving factors:  eyes closed, no talking, wet  compress on eyes Activity:  Aggravates.  Every few months she has a intractable migraine lasting over a day in which she needs to call out of work.   In April 2018, she had a different headache.  It occurred two days after a typical migraine.  She had sudden onset exploding headache, 9/10, associated with word-finding difficulty, some nausea and seeing spots, but no vomiting, photophobia, slurred speech or unilateral numbness or weakness.  It lasted several hours until she was treated in Urgent Care with a Toradol shot.  She has not had a recurrence.   She also has daily dull holocephalic headache, for which she takes Excedrin or ibuprofen daily.   She reports neck pain.  She wakes up from sleep with numbness in the hands.   Past NSAIDS:  no Past analgesics:  Tylenol Past abortive triptans:  no Past muscle relaxants:  no Past anti-emetic:  no Past antihypertensive medications:  no Past antidepressant medications:  no Past anticonvulsant medications:  topiramate  Past vitamins/Herbal/Supplements:  no Other past therapies:  no   Family history of headache:  mom  PAST MEDICAL HISTORY: Past Medical History:  Diagnosis Date  . Headache     MEDICATIONS: Current Outpatient Prescriptions on File Prior to Visit  Medication Sig Dispense Refill  . aspirin-acetaminophen-caffeine (EXCEDRIN MIGRAINE) 250-250-65 MG tablet Take 2 tablets by mouth every 6 (six) hours as needed for headache.    . cetirizine (ZYRTEC) 10 MG tablet Take 10 mg by mouth daily.    . diphenhydrAMINE (BENADRYL) 25 MG tablet Take 25 mg by mouth every 6 (six) hours as needed for allergies.    [name] etonogestrel (NEXPLANON) 68 MG IMPL implant 1 each by Subdermal route once.    [name]  ibuprofen (ADVIL,MOTRIN) 200 MG tablet Take 400 mg by mouth every 6 (six) hours as needed for headache.    . promethazine (PHENERGAN) 12.5 MG suppository Place 12.5 mg rectally every 6 (six) hours as needed for nausea or vomiting.    .  pseudoephedrine (SUDAFED) 30 MG tablet Take 30 mg by mouth every 4 (four) hours as needed for congestion.    . SUMAtriptan (IMITREX) 100 MG tablet Take 1 tablet earliest onset of headache.  May repeat once in 2 hours if headache persists or recurs. 10 tablet 3   No current facility-administered medications on file prior to visit.     ALLERGIES: Allergies  Allergen Reactions  . Cabbage Nausea Only    FAMILY HISTORY: Family History  Problem Relation Age of Onset  . Healthy Mother   . Healthy Father     SOCIAL HISTORY: Social History   Social History  . Marital status: Single    Spouse name: N/A  . Number of children: N/A  . Years of education: N/A   Occupational History  . Not on file.   Social History Main Topics  . Smoking status: Never Smoker  . Smokeless tobacco: Never Used  . Alcohol use 0.0 oz/week     Comment: once a week  . Drug use: No  . Sexual activity: No   Other Topics Concern  . Not on file   Social History Narrative   She is a Pension scheme manager.   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   She lives with twin sister.     REVIEW OF SYSTEMS: Constitutional: No fevers, chills, or sweats, no generalized fatigue, change in appetite Eyes: No visual changes, double vision, eye pain Ear, nose and throat: No hearing loss, ear pain, nasal congestion, sore throat Cardiovascular: No chest pain, palpitations Respiratory:  No shortness of breath at rest or with exertion, wheezes GastrointestinaI: No nausea, vomiting, diarrhea, abdominal pain, fecal incontinence Genitourinary:  No dysuria, urinary retention or frequency Musculoskeletal:  No neck pain, back pain Integumentary: No rash, pruritus, skin lesions Neurological: as above Psychiatric: No depression, insomnia, anxiety Endocrine: No palpitations, fatigue, diaphoresis, mood swings, change in appetite, change in weight, increased thirst Hematologic/Lymphatic:  No purpura,  petechiae. Allergic/Immunologic: no itchy/runny eyes, nasal congestion, recent allergic reactions, rashes  PHYSICAL EXAM: Vitals:   06/20/17 0730  BP: 104/64  Pulse: 84  SpO2: 97%   General: No acute distress.  Patient appears well-groomed.   Head:  Normocephalic/atraumatic Eyes:  Fundi examined but not visualized Neck: supple, no paraspinal tenderness, full range of motion Heart:  Regular rate and rhythm Lungs:  Clear to auscultation bilaterally Back: No paraspinal tenderness Neurological Exam: alert and oriented to person, place, and time. Attention span and concentration intact, recent and remote memory intact, fund of knowledge intact.  Speech fluent and not dysarthric, language intact.  CN II-XII intact. Bulk and tone normal, muscle strength 5/5 throughout.  Sensation to light touch  intact.  Deep tendon reflexes 2+ throughout.  Finger to nose testing intact.  Gait normal, Romberg negative.  IMPRESSION: Migraine without aura, improved.  She is concerned about some possible side effects of propranolol (dizziness, possibly weight gain).  Perhaps change in lifestyle has made the difference, so we will decrease dose.  PLAN: 1.  Decrease propranolol ER back to  daily 2.  For abortive therapy, she will take sumatriptan  with naproxen  and advised to take earliest onset of migraine 3.  Continue lifestyle modification:  Hydration, diet, exercise  4.  Follow up in 3 months.  Shon Millet, DO  CC:  Deatra James, MD

## 2017-06-20 NOTE — Patient Instructions (Signed)
Migraine Recommendations: 1.  We will decrease propranolol back to  daily and we will see if headaches are still controlled..  Let me know if they increase. 2.  Take sumatriptan  WITH naproxen  at earliest onset of headache.  May repeat dose once in 2 hours if needed.  Do not exceed two doses in 24 hours. 3.  Limit use of pain relievers to no more than 2 days out of the week.  These medications include acetaminophen, ibuprofen, triptans and narcotics.  This will help reduce risk of rebound headaches. 4.  Be aware of common food triggers such as processed sweets, processed foods with nitrites (such as deli meat, hot dogs, sausages), foods with MSG, alcohol (such as wine), chocolate, certain cheeses, certain fruits (dried fruits, some citrus fruit), vinegar, diet soda. 4.  Avoid caffeine 5.  Routine exercise 6.  Proper sleep hygiene 7.  Stay adequately hydrated with water 8.  Keep a headache diary. 9.  Maintain proper stress management. 10. Maintain proper diet.  Work on weight loss.  Do not skip meals. 11.  Consider supplements:  Magnesium citrate  to  daily, riboflavin , Coenzyme Q 10  three times daily 12.  Follow up in 3 months.

## 2017-06-24 ENCOUNTER — Other Ambulatory Visit: Payer: Self-pay | Admitting: Neurology

## 2017-09-06 ENCOUNTER — Other Ambulatory Visit: Payer: Self-pay | Admitting: Neurology

## 2017-09-23 ENCOUNTER — Ambulatory Visit (INDEPENDENT_AMBULATORY_CARE_PROVIDER_SITE_OTHER): Payer: BC Managed Care – PPO | Admitting: Neurology

## 2017-09-23 ENCOUNTER — Encounter: Payer: Self-pay | Admitting: Neurology

## 2017-09-23 VITALS — BP 108/68 | HR 105 | Ht 64.0 in | Wt 269.6 lb

## 2017-09-23 DIAGNOSIS — G43009 Migraine without aura, not intractable, without status migrainosus: Secondary | ICD-10-CM

## 2017-09-23 NOTE — Progress Notes (Signed)
NEUROLOGY FOLLOW UP OFFICE NOTE  Catherine Christian [id number]  HISTORY OF PRESENT ILLNESS: Catherine Christian is a 27 year old female with migraine who follows up for chronic migraine.   UPDATE: Headaches had almost resolved following last visit in October.  However, they have increased over the past month, likely due to changes in weather.  They are severe, lasting 2 hours usually with sumatriptan but now occurring once a week.  2 weeks ago, the migraine lasted 3 to 4 days. Frequency of abortive medication: 1 to 2 days a week Current NSAIDS:  Naproxen, Ibuprofen (rarely for back pain) Current analgesics:  no Current triptans:  sumatriptan 100mg  Current anti-emetic:  promethazine 25mg  PR Current muscle relaxants:  no Current anti-anxiolytic:  no Current sleep aide:  no Current Antihypertensive medications:  propranolol ER 60mg  (decreased from 80mg  in October). Current Antidepressant medications:  no Current Anticonvulsant medications:  no Current Vitamins/Herbal/Supplements:  magnesium Current Antihistamines/Decongestants:  Sudafed, Zyrtec Other therapy:  Chiropractic medicine for neck. Birth control: Nexplanon   Caffeine:  no Alcohol:  seldom Smoker:  no Diet:  Hydrates.   Exercise:  no Depression/anxiety:  okay Sleep hygiene:  good   HISTORY: Onset:  Since age 74. Location:  Left sided Quality:  Throbbing, sharp Initial Intensity:  7/10 Aura:  no Prodrome:  no Postdrome:  no Associated symptoms:  Nausea, photophobia, neck pain.   Initial duration  Several hours (3 hours with sumatriptan 50mg ) Initial frequency:  daily Initial Frequency of abortive medication: takes something daily Triggers/exacerbating factors:  talking, laughing, change in weather Relieving factors:  eyes closed, no talking, wet compress on eyes Activity:  Aggravates.  Every few months she has a intractable migraine lasting over a day in which she needs to call out of work. Past medication:   excedrin   In April 2018, she had a different headache.  It occurred two days after a typical migraine.  She had sudden onset exploding headache, 9/10, associated with word-finding difficulty, some nausea and seeing spots, but no vomiting, photophobia, slurred speech or unilateral numbness or weakness.  It lasted several hours until she was treated in Urgent Care with a Toradol shot.  She has not had a recurrence.   She also has daily dull holocephalic headache, for which she takes Excedrin or ibuprofen daily.   She reports neck pain.  She wakes up from sleep with numbness in the hands.  PAST MEDICAL HISTORY: Past Medical History:  Diagnosis Date  . Headache     MEDICATIONS: Current Outpatient Medications on File Prior to Visit  Medication Sig Dispense Refill  . aspirin-acetaminophen-caffeine (EXCEDRIN MIGRAINE) 250-250-65 MG tablet Take 2 tablets by mouth every 6 (six) hours as needed for headache.    . cetirizine (ZYRTEC) 10 MG tablet Take 10 mg by mouth daily.    . diphenhydrAMINE (BENADRYL) 25 MG tablet Take 25 mg by mouth every 6 (six) hours as needed for allergies.    [name] etonogestrel (NEXPLANON) 68 MG IMPL implant 1 each by Subdermal route once.    [name] ibuprofen (ADVIL,MOTRIN) 200 MG tablet Take 400 mg by mouth every 6 (six) hours as needed for headache.    . naproxen (NAPROSYN) 500 MG tablet Take 1 tablet (500 mg total) by mouth every 12 (twelve) hours as needed. 16 tablet 2  . promethazine (PHENERGAN) 12.5 MG suppository Place 12.5 mg rectally every 6 (six) hours as needed for nausea or vomiting.    . propranolol ER (INDERAL LA) 60 MG 24 hr capsule  Take 1 capsule (60 mg total) by mouth daily. 30 capsule 2  . pseudoephedrine (SUDAFED) 30 MG tablet Take 30 mg by mouth every 4 (four) hours as needed for congestion.    . SUMAtriptan (IMITREX) 100 MG tablet TAKE 1 TABLET EARLIEST ONSET OF HEADACHE. MAY REPEAT ONCE IN 2 HOURS IF HEADACHE PERSISTS OR RECURS 10 tablet 3   No current  facility-administered medications on file prior to visit.     ALLERGIES: Allergies  Allergen Reactions  . Cabbage Nausea Only    FAMILY HISTORY: Family History  Problem Relation Age of Onset  . Healthy Mother   . Healthy Father     SOCIAL HISTORY: Social History   Socioeconomic History  . Marital status: Single    Spouse name: Not on file  . Number of children: Not on file  . Years of education: Not on file  . Highest education level: Not on file  Social Needs  . Financial resource strain: Not on file  . Food insecurity - worry: Not on file  . Food insecurity - inability: Not on file  . Transportation needs - medical: Not on file  . Transportation needs - non-medical: Not on file  Occupational History  . Not on file  Tobacco Use  . Smoking status: Never Smoker  . Smokeless tobacco: Never Used  Substance and Sexual Activity  . Alcohol use: Yes    Alcohol/week: 0.0 oz    Comment: once a week  . Drug use: No  . Sexual activity: No    Birth control/protection: Abstinence  Other Topics Concern  . Not on file  Social History Narrative   She is a Pension scheme manager.   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   She lives with twin sister.     REVIEW OF SYSTEMS: Constitutional: No fevers, chills, or sweats, no generalized fatigue, change in appetite Eyes: No visual changes, double vision, eye pain Ear, nose and throat: No hearing loss, ear pain, nasal congestion, sore throat Cardiovascular: No chest pain, palpitations Respiratory:  No shortness of breath at rest or with exertion, wheezes GastrointestinaI: No nausea, vomiting, diarrhea, abdominal pain, fecal incontinence Genitourinary:  No dysuria, urinary retention or frequency Musculoskeletal:  No neck pain, back pain Integumentary: No rash, pruritus, skin lesions Neurological: as above Psychiatric: No depression, insomnia, anxiety Endocrine: No palpitations, fatigue, diaphoresis, mood swings, change in  appetite, change in weight, increased thirst Hematologic/Lymphatic:  No purpura, petechiae. Allergic/Immunologic: no itchy/runny eyes, nasal congestion, recent allergic reactions, rashes  PHYSICAL EXAM: Vitals:   09/23/17 0741  BP: 108/68  Pulse: (!) 105  SpO2: 98%   General: No acute distress.  Patient appears well-groomed.  Morbidly obese body habitus. Head:  Normocephalic/atraumatic Eyes:  Fundi examined but not visualized Neck: supple, no paraspinal tenderness, full range of motion Heart:  Regular rate and rhythm Lungs:  Clear to auscultation bilaterally Back: No paraspinal tenderness Neurological Exam: alert and oriented to person, place, and time. Attention span and concentration intact, recent and remote memory intact, fund of knowledge intact.  Speech fluent and not dysarthric, language intact.  CN II-XII intact. Bulk and tone normal, muscle strength 5/5 throughout.  Sensation to light touch  intact.  Deep tendon reflexes 2+ throughout.  Finger to nose testing intact.  Gait normal, Romberg negative.  IMPRESSION: Migraine, worse likely due to weather change Morbid obesity (BMI 46.28)  PLAN: 1.  She will continue propranolol ER 60mg  daily  2.  She will try supplements (riboflavin and coQ10  in addition to magnesium).  If ineffective, then will switch preventative to topiramate (side effects discussed) 3.  Recommend follow up routine eye exam. 4.  Continue exercise and diet for weight loss 5.  Continue sumatriptan 6.  Follow up in 3 months.  Shon MilletAdam Diago Haik, DO  CC: Deatra JamesVyvyan Sun, MD

## 2017-09-23 NOTE — Patient Instructions (Signed)
Migraine Recommendations: 1.  Continue propranolol ER 60mg  daily.  If headaches don't improve, contact me and we can try topiramate. 2.  Take sumatriptan (with or without naproxen) at earliest onset of headache.  May repeat dose once in 2 hours if needed.  Do not exceed two doses in 24 hours. 3.  Limit use of pain relievers to no more than 2 days out of the week.  These medications include acetaminophen, ibuprofen, triptans and narcotics.  This will help reduce risk of rebound headaches. 4.  Be aware of common food triggers such as processed sweets, processed foods with nitrites (such as deli meat, hot dogs, sausages), foods with MSG, alcohol (such as wine), chocolate, certain cheeses, certain fruits (dried fruits, bananas, some citrus fruit), vinegar, diet soda. 4.  Avoid caffeine 5.  Routine exercise 6.  Proper sleep hygiene 7.  Stay adequately hydrated with water 8.  Keep a headache diary. 9.  Maintain proper stress management. 10.  Do not skip meals. Work on weight loss.  11.  Consider supplements:  Magnesium citrate 400mg  to 600mg  daily, riboflavin 400mg , Coenzyme Q 10 100mg  three times daily  Follow up in 3 months.

## 2017-10-31 ENCOUNTER — Other Ambulatory Visit: Payer: Self-pay | Admitting: Neurology

## 2017-12-23 ENCOUNTER — Encounter: Payer: Self-pay | Admitting: Neurology

## 2017-12-23 ENCOUNTER — Ambulatory Visit: Payer: BC Managed Care – PPO | Admitting: Neurology

## 2017-12-23 VITALS — BP 110/70 | HR 102 | Wt 267.4 lb

## 2017-12-23 DIAGNOSIS — M5416 Radiculopathy, lumbar region: Secondary | ICD-10-CM | POA: Diagnosis not present

## 2017-12-23 DIAGNOSIS — G43009 Migraine without aura, not intractable, without status migrainosus: Secondary | ICD-10-CM | POA: Diagnosis not present

## 2017-12-23 NOTE — Patient Instructions (Signed)
1.  Stop propranolol 2.  Continue sumatriptan with naproxen.  If still not effective, contact me. 3.  I will refer you to Sports Medicine for back issues which may be contributing to the leg pain 4.  Continue supplements 5.  Try to continue exercise and diet

## 2017-12-23 NOTE — Progress Notes (Signed)
NEUROLOGY FOLLOW UP OFFICE NOTE  Catherine Christian [id number]  HISTORY OF PRESENT ILLNESS: Catherine Christian is a 27 year old female with migraine who follows up for chronic migraine.   UPDATE: Nexplanon was removed and she started magnesium, riboflavin and CoQ10.  Headaches have dramatically improved.  They are still moderate-severe but she only had 2 to 3 over past 3 months.  They typically respond quickly to sumatriptan with naproxen.  However her last migraine two weeks ago lasted 2 days. Current NSAIDS:  Naproxen, Ibuprofen (rarely for back pain) Current analgesics:  no Current triptans:  sumatriptan 100mg  Current anti-emetic:  promethazine 25mg  PR Current muscle relaxants:  no Current anti-anxiolytic:  no Current sleep aide:  no Current Antihypertensive medications:  propranolol ER 60mg  (higher doses caused dizziness) Current Antidepressant medications:  no Current Anticonvulsant medications:  no Current Vitamins/Herbal/Supplements:  magnesium, riboflavin, Co Q10 Current Antihistamines/Decongestants:  Sudafed, Zyrtec Other therapy:  Chiropractic medicine for neck. Birth control: Nexplanon  She reports a new problem.  About 3 to 4 weeks ago, she developed right leg pain.  It is a severe stabbing pain down the anterior-lateral leg above the knee.  It occurs in any position (laying down, standing or walking).  It typically lasts 30 minutes.  It is not aggravated (except for walking) or relieved by anything.  There is no associated weakness or numbness.  At first, it occurred just in the right leg.  She received a steroid taper and then it has since involved mostly the left leg.  She has history of back pain.  She was seeing [facility] for [facility] between October and December.  She stopped because back pain resolved and it was getting expensive.   Caffeine:  no Alcohol:  seldom Smoker:  no Diet:  Hydrates.   Exercise:  Started to exercise but stopped since onset of leg  pain. Depression/anxiety:  Some mild anxiety. Sleep hygiene:  good   HISTORY: Onset:  Since age 37. Location:  Left sided Quality:  Throbbing, sharp.  Not a new or thunderclap headache. Initial Intensity:  7/10 Aura:  no Prodrome:  no Postdrome:  no Associated symptoms:  Nausea, photophobia, neck pain.   No visual disturbance or unilateral numbness or weakness.  Initial duration  Several hours (3 hours with sumatriptan 50mg ) Initial frequency:  daily Initial Frequency of abortive medication: takes something daily Triggers/exacerbating factors:  talking, laughing, change in weather Relieving factors:  eyes closed, no talking, wet compress on eyes Activity:  Aggravates.  Every few months she has a intractable migraine lasting over a day in which she needs to call out of work. Past medication:  excedrin   In April 2018, she had a different headache.  It occurred two days after a typical migraine.  She had sudden onset exploding headache, 9/10, associated with word-finding difficulty, some nausea and seeing spots, but no vomiting, photophobia, slurred speech or unilateral numbness or weakness.  It lasted several hours until she was treated in Urgent Care with a Toradol shot.  She has not had a recurrence.  PAST MEDICAL HISTORY: Past Medical History:  Diagnosis Date  . Headache     MEDICATIONS: Current Outpatient Medications on File Prior to Visit  Medication Sig Dispense Refill  . aspirin-acetaminophen-caffeine (EXCEDRIN MIGRAINE) 250-250-65 MG tablet Take 2 tablets by mouth every 6 (six) hours as needed for headache.    . cetirizine (ZYRTEC) 10 MG tablet Take 10 mg by mouth daily.    . diphenhydrAMINE (BENADRYL) 25 MG  tablet Take 25 mg by mouth every 6 (six) hours as needed for allergies.    [name] etonogestrel (NEXPLANON) 68 MG IMPL implant 1 each by Subdermal route once.    [name] ibuprofen (ADVIL,MOTRIN) 200 MG tablet Take 400 mg by mouth every 6 (six) hours as needed for headache.    .  naproxen (NAPROSYN) 500 MG tablet Take 1 tablet (500 mg total) by mouth every 12 (twelve) hours as needed. 16 tablet 2  . promethazine (PHENERGAN) 12.5 MG suppository Place 12.5 mg rectally every 6 (six) hours as needed for nausea or vomiting.    . propranolol ER (INDERAL LA) 60 MG 24 hr capsule TAKE 1 CAPSULE BY MOUTH EVERY DAY 30 capsule 2  . pseudoephedrine (SUDAFED) 30 MG tablet Take 30 mg by mouth every 4 (four) hours as needed for congestion.    . SUMAtriptan (IMITREX) 100 MG tablet TAKE 1 TABLET EARLIEST ONSET OF HEADACHE. MAY REPEAT ONCE IN 2 HOURS IF HEADACHE PERSISTS OR RECURS 10 tablet 3   No current facility-administered medications on file prior to visit.     ALLERGIES: Allergies  Allergen Reactions  . Cabbage Nausea Only    FAMILY HISTORY: Family History  Problem Relation Age of Onset  . Healthy Mother   . Healthy Father     SOCIAL HISTORY: Social History   Socioeconomic History  . Marital status: Single    Spouse name: Not on file  . Number of children: Not on file  . Years of education: Not on file  . Highest education level: Not on file  Occupational History  . Not on file  Social Needs  . Financial resource strain: Not on file  . Food insecurity:    Worry: Not on file    Inability: Not on file  . Transportation needs:    Medical: Not on file    Non-medical: Not on file  Tobacco Use  . Smoking status: Never Smoker  . Smokeless tobacco: Never Used  Substance and Sexual Activity  . Alcohol use: Yes    Alcohol/week: 0.0 oz    Comment: once a week  . Drug use: No  . Sexual activity: Never    Birth control/protection: Abstinence  Lifestyle  . Physical activity:    Days per week: Not on file    Minutes per session: Not on file  . Stress: Not on file  Relationships  . Social connections:    Talks on phone: Not on file    Gets together: Not on file    Attends religious service: Not on file    Active member of club or organization: Not on file     Attends meetings of clubs or organizations: Not on file    Relationship status: Not on file  . Intimate partner violence:    Fear of current or ex partner: Not on file    Emotionally abused: Not on file    Physically abused: Not on file    Forced sexual activity: Not on file  Other Topics Concern  . Not on file  Social History Narrative   She is a Pension scheme manager.   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   She lives with twin sister.     REVIEW OF SYSTEMS: Constitutional: No fevers, chills, or sweats, no generalized fatigue, change in appetite Eyes: No visual changes, double vision, eye pain Ear, nose and throat: No hearing loss, ear pain, nasal congestion, sore throat Cardiovascular: No chest pain, palpitations Respiratory:  No shortness  of breath at rest or with exertion, wheezes GastrointestinaI: No nausea, vomiting, diarrhea, abdominal pain, fecal incontinence Genitourinary:  No dysuria, urinary retention or frequency Musculoskeletal:  Back pain Integumentary: No rash, pruritus, skin lesions Neurological: as above Psychiatric: mild anxiety Endocrine: No palpitations, fatigue, diaphoresis, mood swings, change in appetite, change in weight, increased thirst Hematologic/Lymphatic:  No purpura, petechiae. Allergic/Immunologic: no itchy/runny eyes, nasal congestion, recent allergic reactions, rashes  PHYSICAL EXAM: Vitals:   12/23/17 0733  BP: 110/70  Pulse: (!) 102  SpO2: 96%   General: No acute distress.  Patient appears well-groomed.   Head:  Normocephalic/atraumatic Eyes:  Fundi examined but not visualized Neck: supple, no paraspinal tenderness, full range of motion Heart:  Regular rate and rhythm Lungs:  Clear to auscultation bilaterally Back: Mild paraspinal tenderness Neurological Exam: alert and oriented to person, place, and time. Attention span and concentration intact, recent and remote memory intact, fund of knowledge intact.  Speech fluent and not  dysarthric, language intact.  CN II-XII intact. Bulk and tone normal, muscle strength 5/5 throughout.  Sensation to light touch, temperature and vibration intact.  Deep tendon reflexes 2+ throughout, toes downgoing.  Finger to nose and heel to shin testing intact.  Gait normal, Romberg negative.  Positive left straight leg test.  IMPRESSION: 1.  Migraine without aura, not intractable improved 2.  Possible lumbar radiculopathy, bilateral.  FABER test negative.  Also consider SI joint dysfunction. 3.  Morbid obesity  PLAN: 1.  Stop propranolol 2.  Continue sumatriptan with naproxen.  Limit use of pain relievers to no more than 2 days out of week.  If still not effective, contact me. 3.  I will refer to Sports Medicine/[facility] for to assess and treat back and bilateral leg pain.  If ineffective, consider MRI of lumbar spine. 4.  Continue supplements (Mg, B2, CoQ10) 5.  Encouraged diet, exercise and weight loss 6.  Headache diary 7.  Follow up in 3 months.  Shon MilletAdam Carys Malina, DO  CC: Dr. Wynelle LinkSun

## 2018-01-08 ENCOUNTER — Encounter: Payer: Self-pay | Admitting: Family Medicine

## 2018-02-26 ENCOUNTER — Other Ambulatory Visit: Payer: Self-pay | Admitting: Neurology

## 2018-04-07 ENCOUNTER — Ambulatory Visit: Payer: BC Managed Care – PPO | Admitting: Neurology

## 2018-04-07 ENCOUNTER — Encounter: Payer: Self-pay | Admitting: Neurology

## 2018-04-07 VITALS — BP 118/88 | HR 94 | Ht 64.0 in | Wt 261.0 lb

## 2018-04-07 DIAGNOSIS — G43009 Migraine without aura, not intractable, without status migrainosus: Secondary | ICD-10-CM

## 2018-04-07 NOTE — Patient Instructions (Addendum)
1.  Take sumatriptan 100mg  at earliest onset of headache.  May repeat once after 2 hours if needed.  Do not exceed 2 tablets in 24 hours. 2.  For the migraines during your period, take sumatriptan WITH naproxen 500mg .  May repeat  (sumatripan and naproxen) once after TWO hours if needed, not to exceed 2 doses in 24 hours. 3.  Limit use of pain relievers to no more than 2 days out of week to prevent rebound headache 4.  Continue magnesium, riboflavin and CoQ10 5.  Keep headache diary 6.  Follow up in 5 months

## 2018-04-07 NOTE — Progress Notes (Signed)
NEUROLOGY FOLLOW UP OFFICE NOTE  Catherine Christian [id number]  HISTORY OF PRESENT ILLNESS: Catherine Christian is a 27 year old female with migraine who follows up for chronic migraine.   UPDATE: Last visit, propranolol was discontinued. Migraines typically moderate, 1 hour duration with sumatripan 100mg  and occur once a week.  However, the migraine that occurs during her period is severe and not touched by sumatriptan. Rescue protocol for typical migraine:  Sumatriptan 100mg  (1 dose only) Rescue protocol for menstrual migraine:  Sumatriptan 100mg  with naproxen 500mg .  Repeats sumatriptan alone in 4 hours. Current NSAIDS:  Naproxen, Ibuprofen (rarely for back pain) Current analgesics:  no Current triptans:  sumatriptan 100mg  Current anti-emetic:  promethazine 25mg  PR Current muscle relaxants:  no Current anti-anxiolytic:  no Current sleep aide:  no Current Antihypertensive medications:  no Current Antidepressant medications:  no Current Anticonvulsant medications:  no Current Vitamins/Herbal/Supplements:  magnesium, riboflavin, Co Q10 Current Antihistamines/Decongestants:  Sudafed, Zyrtec Other therapy:  Chiropractic medicine for neck.   Back pain resolved.   Caffeine:  no Alcohol:  seldom Smoker:  no Diet:  Hydrates.   Exercise:  Started to exercise but stopped since onset of leg pain. Depression/anxiety:  Some mild anxiety. Sleep hygiene:  good   HISTORY: Onset:  Since age 27. Location:  Left sided Quality:  Throbbing, sharp.  Not a new or thunderclap headache. Initial Intensity:  7/10 Aura:  no Prodrome:  no Postdrome:  no Associated symptoms:  Nausea, photophobia, neck pain.   No visual disturbance or unilateral numbness or weakness.  Initial duration  Several hours (3 hours with sumatriptan 50mg ) Initial frequency:  daily Initial Frequency of abortive medication: takes something daily Triggers/exacerbating factors:  talking, laughing, change in weather,  possibly Nexplanon Relieving factors:  eyes closed, no talking, wet compress on eyes Activity:  Aggravates.  Every few months she has a intractable migraine lasting over a day in which she needs to call out of work. Past NSAIDS:  no Past analgesics:  Excedrin Past abortive triptans:  no Past abortive ergotamine:  no Past muscle relaxants:  no Past anti-emetic:  no Past antihypertensive medications:  propranolol ER 60mg  (higher doses caused dizziness) Past antidepressant medications:  no Past anticonvulsant medications:  no Past anti-CGRP:  no Past vitamins/Herbal/Supplements:  no Past antihistamines/decongestants:  no Other past therapies:  no   In April 2018, she had a different headache.  It occurred two days after a typical migraine.  She had sudden onset exploding headache, 9/10, associated with word-finding difficulty, some nausea and seeing spots, but no vomiting, photophobia, slurred speech or unilateral numbness or weakness.  It lasted several hours until she was treated in Urgent Care with a Toradol shot.  She has not had a recurrence.  In March 2019, she developed right leg pain.  It is a severe stabbing pain down the anterior-lateral leg above the knee.  It occurs in any position (laying down, standing or walking).  It typically lasts 30 minutes.  It is not aggravated (except for walking) or relieved by anything.  There is no associated weakness or numbness.  At first, it occurred just in the right leg.  She received a steroid taper and then it has since involved mostly the left leg.  She has history of back pain.  She was seeing a Land[facility] for her back between October and December.  She stopped because back pain resolved and it was getting expensive.    PAST MEDICAL HISTORY: Past Medical History:  Diagnosis Date  .  Headache     MEDICATIONS: Current Outpatient Medications on File Prior to Visit  Medication Sig Dispense Refill  . co-enzyme Q-10 30 MG capsule Take 30 mg by  mouth 3 (three) times daily.    . fluticasone (FLONASE) 50 MCG/ACT nasal spray Place into both nostrils daily.    . Multiple Vitamin (MULTIVITAMIN) tablet Take 1 tablet by mouth daily.    . vitamin B-12 (CYANOCOBALAMIN) 500 MCG tablet Take 500 mcg by mouth daily.    [name] aspirin-acetaminophen-caffeine (EXCEDRIN MIGRAINE) 250-250-65 MG tablet Take 2 tablets by mouth every 6 (six) hours as needed for headache.    . cetirizine (ZYRTEC) 10 MG tablet Take 10 mg by mouth daily.    . diphenhydrAMINE (BENADRYL) 25 MG tablet Take 25 mg by mouth every 6 (six) hours as needed for allergies.    [name] etonogestrel (NEXPLANON) 68 MG IMPL implant 1 each by Subdermal route once.    [name] ibuprofen (ADVIL,MOTRIN) 200 MG tablet Take 400 mg by mouth every 6 (six) hours as needed for headache.    . naproxen (NAPROSYN) 500 MG tablet Take 1 tablet (500 mg total) by mouth every 12 (twelve) hours as needed. 16 tablet 2  . promethazine (PHENERGAN) 12.5 MG suppository Place 12.5 mg rectally every 6 (six) hours as needed for nausea or vomiting.    . propranolol ER (INDERAL LA) 60 MG 24 hr capsule TAKE 1 CAPSULE BY MOUTH EVERY DAY (Patient not taking: Reported on 04/07/2018) 30 capsule 2  . pseudoephedrine (SUDAFED) 30 MG tablet Take 30 mg by mouth every 4 (four) hours as needed for congestion.    . SUMAtriptan (IMITREX) 100 MG tablet TAKE 1 TABLET EARLIEST ONSET OF HEADACHE. MAY REPEAT ONCE IN 2 HOURS IF HEADACHE PERSISTS OR RECURS 10 tablet 3   No current facility-administered medications on file prior to visit.     ALLERGIES: Allergies  Allergen Reactions  . Cabbage Nausea Only    FAMILY HISTORY: Family History  Problem Relation Age of Onset  . Healthy Mother   . Healthy Father     SOCIAL HISTORY: Social History   Socioeconomic History  . Marital status: Single    Spouse name: Not on file  . Number of children: Not on file  . Years of education: Not on file  . Highest education level: Not on file  Occupational  History  . Not on file  Social Needs  . Financial resource strain: Not on file  . Food insecurity:    Worry: Not on file    Inability: Not on file  . Transportation needs:    Medical: Not on file    Non-medical: Not on file  Tobacco Use  . Smoking status: Never Smoker  . Smokeless tobacco: Never Used  Substance and Sexual Activity  . Alcohol use: Yes    Alcohol/week: 0.0 oz    Comment: once a week  . Drug use: No  . Sexual activity: Never    Birth control/protection: Abstinence  Lifestyle  . Physical activity:    Days per week: Not on file    Minutes per session: Not on file  . Stress: Not on file  Relationships  . Social connections:    Talks on phone: Not on file    Gets together: Not on file    Attends religious service: Not on file    Active member of club or organization: Not on file    Attends meetings of clubs or organizations: Not on file  Relationship status: Not on file  . Intimate partner violence:    Fear of current or ex partner: Not on file    Emotionally abused: Not on file    Physically abused: Not on file    Forced sexual activity: Not on file  Other Topics Concern  . Not on file  Social History Narrative   She is a Pension scheme manager.   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   She lives with twin sister.     REVIEW OF SYSTEMS: Constitutional: No fevers, chills, or sweats, no generalized fatigue, change in appetite Eyes: No visual changes, double vision, eye pain Ear, nose and throat: No hearing loss, ear pain, nasal congestion, sore throat Cardiovascular: No chest pain, palpitations Respiratory:  No shortness of breath at rest or with exertion, wheezes GastrointestinaI: No nausea, vomiting, diarrhea, abdominal pain, fecal incontinence Genitourinary:  No dysuria, urinary retention or frequency Musculoskeletal:  No neck pain, back pain Integumentary: No rash, pruritus, skin lesions Neurological: as above Psychiatric: No depression,  insomnia, anxiety Endocrine: No palpitations, fatigue, diaphoresis, mood swings, change in appetite, change in weight, increased thirst Hematologic/Lymphatic:  No purpura, petechiae. Allergic/Immunologic: no itchy/runny eyes, nasal congestion, recent allergic reactions, rashes  PHYSICAL EXAM: Vitals:   04/07/18 1134  BP: 118/88  Pulse: 94  SpO2: 97%   General: No acute distress.  Patient appears well-groomed.   Head:  Normocephalic/atraumatic Eyes:  Fundi examined but not visualized Neck: supple, no paraspinal tenderness, full range of motion Heart:  Regular rate and rhythm Lungs:  Clear to auscultation bilaterally Back: No paraspinal tenderness Neurological Exam: alert and oriented to person, place, and time. Attention span and concentration intact, recent and remote memory intact, fund of knowledge intact.  Speech fluent and not dysarthric, language intact.  CN II-XII intact. Bulk and tone normal, muscle strength 5/5 throughout.  Sensation to light touch  intact.  Deep tendon reflexes 2+ throughout.  Finger to nose testing intact.  Gait normal, Romberg negative.  IMPRESSION: Migraine without aura, not intractable, without status migrainosus  PLAN: 1.  Take sumatriptan 100mg  at earliest onset of headache.  May repeat once after 2 hours if needed.  Do not exceed 2 tablets in 24 hours. 2.  For the migraines during your period, take sumatriptan WITH naproxen 500mg .  May repeat  (sumatripan and naproxen) once after TWO hours if needed, not to exceed 2 doses in 24 hours. 3.  Limit use of pain relievers to no more than 2 days out of week to prevent rebound headache 4.  Continue magnesium, riboflavin and CoQ10 5.  Keep headache diary 6.  Follow up in 5 months  Shon Millet, DO  CC: Dr. Wynelle Link

## 2018-07-29 ENCOUNTER — Other Ambulatory Visit: Payer: Self-pay | Admitting: Neurology

## 2018-09-15 NOTE — Progress Notes (Signed)
NEUROLOGY FOLLOW UP OFFICE NOTE  Catherine DoneMarissa Whiteaker [id number]  HISTORY OF PRESENT ILLNESS: Catherine Christian is a 27 year old female who follows up for migraine.  UPDATE: Intensity:  Moderate  Duration:  1 hour  Frequency:  1 to 2 days a month She has had dull frontal pressure headache past two weeks.  Taking Sudafed. Rescue protocol for typical migraine:  Sumatriptan 100mg  Rescue protocol for menstrual migraine:  Sumatriptan 100mg  with naproxen 500mg , then repeats sumatriptan alone in 4 hours. Current NSAIDS: Naproxen Current analgesics: None Current triptans: Sumatriptan 100 mg (with or without naproxen 500 mg) Current ergotamine: None Current anti-emetic: None Current muscle relaxants: None Current anti-anxiolytic: None Current sleep aide: None Current Antihypertensive medications: None Current Antidepressant medications: None Current Anticonvulsant medications: None Current anti-CGRP: None Current Vitamins/Herbal/Supplements: Magnesium, riboflavin, co-Q10 Current Antihistamines/Decongestants: Sudafed Other therapy: Chiropractic medicine for neck Hormone/birth control: None  Caffeine: No Alcohol: Seldom Smoker: No Diet: Hydrates Exercise:  gym Depression: No; Anxiety: Mild Other pain:  No Sleep hygiene: Good  HISTORY:  Said: Since age 27 Location:  Left sided Quality:  Throbbing, sharp.  Not a new or thunderclap headache. Initial Intensity:  7/10 Aura:  no Prodrome:  no Postdrome:  no Associated symptoms: Nausea, photophobia, neck pain.   No visual disturbance or unilateral numbness or weakness.  Initial duration  Several hours (3 hours with sumatriptan 50mg ) Initial frequency:  daily Initial Frequency of abortive medication: takes something daily Triggers/exacerbating factors: Menstrual cycle, talking, laughing, change in weather, possibly Nexplanon Relieving factors:  eyes closed, no talking, wet compress on eyes Activity:  Aggravates.  Every few months  she has a intractable migraine lasting over a day in which she needs to call out of work. Past NSAIDS:  no Past analgesics:  Excedrin Past abortive triptans:  no Past abortive ergotamine:  no Past muscle relaxants:  no Past anti-emetic:  promethazine 25mg  PR Past antihypertensive medications:  propranolol ER 60mg  (higher doses caused dizziness) Past antidepressant medications:  no Past anticonvulsant medications:  no Past anti-CGRP:  no Past vitamins/Herbal/Supplements:  no Past antihistamines/decongestants:  no Other past therapies:  no  In April 2018, she had a different headache.  It occurred two days after a typical migraine.  She had sudden onset exploding headache, 9/10, associated with word-finding difficulty, some nausea and seeing spots, but no vomiting, photophobia, slurred speech or unilateral numbness or weakness.  It lasted several hours until she was treated in Urgent Care with a Toradol shot.  She has not had a recurrence.  In March 2019, she developed right leg pain.  It is a severe stabbing pain down the anterior-lateral leg above the knee.  It occurs in any position (laying down, standing or walking).  It typically lasts 30 minutes.  It is not aggravated (except for walking) or relieved by anything.  There is no associated weakness or numbness.  At first, it occurred just in the right leg.  She received a steroid taper and then it has since involved mostly the left leg.  She has history of back pain. She stopped because back pain resolved and it was getting expensive.  PAST MEDICAL HISTORY: Past Medical History:  Diagnosis Date  . Headache     MEDICATIONS: Current Outpatient Medications on File Prior to Visit  Medication Sig Dispense Refill  . aspirin-acetaminophen-caffeine (EXCEDRIN MIGRAINE) 250-250-65 MG tablet Take 2 tablets by mouth every 6 (six) hours as needed for headache.    . cetirizine (ZYRTEC) 10 MG tablet Take 10 mg by  mouth daily.    [name] co-enzyme Q-10  30 MG capsule Take 30 mg by mouth 3 (three) times daily.    . diphenhydrAMINE (BENADRYL) 25 MG tablet Take 25 mg by mouth every 6 (six) hours as needed for allergies.    [name] etonogestrel (NEXPLANON) 68 MG IMPL implant 1 each by Subdermal route once.    . fluticasone (FLONASE) 50 MCG/ACT nasal spray Place into both nostrils daily.    [name] ibuprofen (ADVIL,MOTRIN) 200 MG tablet Take 400 mg by mouth every 6 (six) hours as needed for headache.    . Multiple Vitamin (MULTIVITAMIN) tablet Take 1 tablet by mouth daily.    . naproxen (NAPROSYN) 500 MG tablet TAKE 1 TABLET (500 MG TOTAL) BY MOUTH EVERY 12 (TWELVE) HOURS AS NEEDED. 16 tablet 2  . promethazine (PHENERGAN) 12.5 MG suppository Place 12.5 mg rectally every 6 (six) hours as needed for nausea or vomiting.    . propranolol ER (INDERAL LA) 60 MG 24 hr capsule TAKE 1 CAPSULE BY MOUTH EVERY DAY (Patient not taking: Reported on 04/07/2018) 30 capsule 2  . pseudoephedrine (SUDAFED) 30 MG tablet Take 30 mg by mouth every 4 (four) hours as needed for congestion.    . SUMAtriptan (IMITREX) 100 MG tablet TAKE 1 TABLET EARLIEST ONSET OF HEADACHE. MAY REPEAT ONCE IN 2 HOURS IF HEADACHE PERSISTS OR RECURS 10 tablet 3  . vitamin B-12 (CYANOCOBALAMIN) 500 MCG tablet Take 500 mcg by mouth daily.     No current facility-administered medications on file prior to visit.     ALLERGIES: Allergies  Allergen Reactions  . Cabbage Nausea Only    FAMILY HISTORY: Family History  Problem Relation Age of Onset  . Healthy Mother   . Healthy Father    SOCIAL HISTORY: Social History   Socioeconomic History  . Marital status: Single    Spouse name: Not on file  . Number of children: Not on file  . Years of education: Not on file  . Highest education level: Not on file  Occupational History  . Not on file  Social Needs  . Financial resource strain: Not on file  . Food insecurity:    Worry: Not on file    Inability: Not on file  . Transportation needs:     Medical: Not on file    Non-medical: Not on file  Tobacco Use  . Smoking status: Never Smoker  . Smokeless tobacco: Never Used  Substance and Sexual Activity  . Alcohol use: Yes    Alcohol/week: 0.0 standard drinks    Comment: once a week  . Drug use: No  . Sexual activity: Never    Birth control/protection: Abstinence  Lifestyle  . Physical activity:    Days per week: Not on file    Minutes per session: Not on file  . Stress: Not on file  Relationships  . Social connections:    Talks on phone: Not on file    Gets together: Not on file    Attends religious service: Not on file    Active member of club or organization: Not on file    Attends meetings of clubs or organizations: Not on file    Relationship status: Not on file  . Intimate partner violence:    Fear of current or ex partner: Not on file    Emotionally abused: Not on file    Physically abused: Not on file    Forced sexual activity: Not on file  Other Topics Concern  .  Not on file  Social History Narrative   She is a Pension scheme managerspecial education teacher.   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   She lives with twin sister.     REVIEW OF SYSTEMS: Constitutional: No fevers, chills, or sweats, no generalized fatigue, change in appetite Eyes: No visual changes, double vision, eye pain Ear, nose and throat: No hearing loss, ear pain, nasal congestion, sore throat Cardiovascular: No chest pain, palpitations Respiratory:  No shortness of breath at rest or with exertion, wheezes GastrointestinaI: No nausea, vomiting, diarrhea, abdominal pain, fecal incontinence Genitourinary:  No dysuria, urinary retention or frequency Musculoskeletal:  No neck pain, back pain Integumentary: No rash, pruritus, skin lesions Neurological: as above Psychiatric: No depression, insomnia, anxiety Endocrine: No palpitations, fatigue, diaphoresis, mood swings, change in appetite, change in weight, increased thirst Hematologic/Lymphatic:  No purpura,  petechiae. Allergic/Immunologic: no itchy/runny eyes, nasal congestion, recent allergic reactions, rashes  PHYSICAL EXAM: Blood pressure 96/64, pulse (!) 121, height 5\' 4"  (1.626 m), weight 248 lb (112.5 kg), SpO2 96 %. General: No acute distress.  Patient appears well-groomed.  Head:  Normocephalic/atraumatic Eyes:  Fundi examined but not visualized Neck: supple, no paraspinal tenderness, full range of motion Heart:  Regular rate and rhythm Lungs:  Clear to auscultation bilaterally Back: No paraspinal tenderness Neurological Exam: alert and oriented to person, place, and time. Attention span and concentration intact, recent and remote memory intact, fund of knowledge intact.  Speech fluent and not dysarthric, language intact.  CN II-XII intact. Bulk and tone normal, muscle strength 5/5 throughout.  Sensation to light touch  intact.  Deep tendon reflexes 2+ throughout.  Finger to nose testing intact.  Gait normal, Romberg negative.  IMPRESSION: Migraine without aura, without status migrainosus, not intractable  PLAN: 1.  For abortive therapy, sumatriptan 100mg  (with naproxen if around/during her menstrual period) 2.  Limit use of pain relievers to no more than 2 days out of week to prevent risk of rebound or medication-overuse headache. 3.  Keep headache diary 4.  Exercise, hydration, caffeine cessation, sleep hygiene, monitor for and avoid triggers 5.  Consider:  magnesium citrate 400mg  daily, riboflavin 400mg  daily, and coenzyme Q10 100mg  three times daily 6.  Follow up in 6 months.  Shon MilletAdam , DO  CC: Deatra JamesVyvyan Sun, MD

## 2018-09-17 ENCOUNTER — Encounter: Payer: Self-pay | Admitting: Neurology

## 2018-09-17 ENCOUNTER — Ambulatory Visit: Payer: BC Managed Care – PPO | Admitting: Neurology

## 2018-09-17 VITALS — BP 96/64 | HR 121 | Ht 64.0 in | Wt 248.0 lb

## 2018-09-17 DIAGNOSIS — G43009 Migraine without aura, not intractable, without status migrainosus: Secondary | ICD-10-CM

## 2018-09-17 NOTE — Patient Instructions (Signed)
1.  Continue current regimen:  Sumatriptan (with or without naproxen) 2.  Limit use of pain relievers to no more than 2 days out of week to prevent risk of rebound or medication-overuse headache. 3.   Keep headache diary 4.  Continue magnesium, riboflavin, CoQ10 5.  Follow up in 6 months.

## 2019-01-27 ENCOUNTER — Other Ambulatory Visit: Payer: Self-pay | Admitting: Neurology

## 2019-03-18 ENCOUNTER — Ambulatory Visit: Payer: BC Managed Care – PPO | Admitting: Neurology

## 2019-06-12 ENCOUNTER — Other Ambulatory Visit: Payer: Self-pay | Admitting: Neurology

## 2019-08-30 LAB — OB RESULTS CONSOLE RUBELLA ANTIBODY, IGM: Rubella: IMMUNE

## 2019-08-30 LAB — OB RESULTS CONSOLE RPR: RPR: NONREACTIVE

## 2019-08-30 LAB — OB RESULTS CONSOLE ABO/RH: RH Type: NEGATIVE

## 2019-08-30 LAB — OB RESULTS CONSOLE HEPATITIS B SURFACE ANTIGEN: Hepatitis B Surface Ag: NEGATIVE

## 2019-08-30 LAB — OB RESULTS CONSOLE HIV ANTIBODY (ROUTINE TESTING): HIV: NONREACTIVE

## 2019-11-09 ENCOUNTER — Inpatient Hospital Stay (HOSPITAL_BASED_OUTPATIENT_CLINIC_OR_DEPARTMENT_OTHER): Payer: BC Managed Care – PPO

## 2019-11-09 ENCOUNTER — Inpatient Hospital Stay (HOSPITAL_COMMUNITY)
Admission: AD | Admit: 2019-11-09 | Discharge: 2019-11-09 | Disposition: A | Discharge status: Home / Self Care | Payer: BC Managed Care – PPO | Attending: Obstetrics & Gynecology | Admitting: Obstetrics & Gynecology

## 2019-11-09 ENCOUNTER — Other Ambulatory Visit: Payer: Self-pay

## 2019-11-09 ENCOUNTER — Encounter (HOSPITAL_COMMUNITY): Payer: Self-pay | Admitting: Obstetrics and Gynecology

## 2019-11-09 DIAGNOSIS — R102 Pelvic and perineal pain: Secondary | ICD-10-CM | POA: Diagnosis not present

## 2019-11-09 DIAGNOSIS — O418X20 Other specified disorders of amniotic fluid and membranes, second trimester, not applicable or unspecified: Secondary | ICD-10-CM | POA: Diagnosis not present

## 2019-11-09 DIAGNOSIS — O468X2 Other antepartum hemorrhage, second trimester: Secondary | ICD-10-CM | POA: Insufficient documentation

## 2019-11-09 DIAGNOSIS — O26892 Other specified pregnancy related conditions, second trimester: Secondary | ICD-10-CM

## 2019-11-09 DIAGNOSIS — Z3A17 17 weeks gestation of pregnancy: Secondary | ICD-10-CM | POA: Diagnosis not present

## 2019-11-09 DIAGNOSIS — Z6791 Unspecified blood type, Rh negative: Secondary | ICD-10-CM

## 2019-11-09 DIAGNOSIS — O4692 Antepartum hemorrhage, unspecified, second trimester: Secondary | ICD-10-CM

## 2019-11-09 DIAGNOSIS — O209 Hemorrhage in early pregnancy, unspecified: Secondary | ICD-10-CM | POA: Diagnosis present

## 2019-11-09 LAB — CBC
HCT: 36.5 % (ref 36.0–46.0)
Hemoglobin: 12.8 g/dL (ref 12.0–15.0)
MCH: 31 pg (ref 26.0–34.0)
MCHC: 35.1 g/dL (ref 30.0–36.0)
MCV: 88.4 fL (ref 80.0–100.0)
Platelets: 222 10*3/uL (ref 150–400)
RBC: 4.13 MIL/uL (ref 3.87–5.11)
RDW: 12.3 % (ref 11.5–15.5)
WBC: 13.8 10*3/uL — ABNORMAL HIGH (ref 4.0–10.5)
nRBC: 0 % (ref 0.0–0.2)

## 2019-11-09 MED ORDER — RHO D IMMUNE GLOBULIN 1500 UNIT/2ML IJ SOSY
300.0000 ug | PREFILLED_SYRINGE | Freq: Once | INTRAMUSCULAR | Status: AC
  Administered 2019-11-09: 18:00:00 300 ug via INTRAMUSCULAR
  Filled 2019-11-09: qty 2

## 2019-11-09 NOTE — MAU Provider Note (Signed)
Chief Complaint: Vaginal Bleeding and Pelvic Pain   First Provider Initiated Contact with Patient 11/09/19 1634     SUBJECTIVE HPI: Catherine Christian is a 29 y.o. G1P0000 at [redacted]w[redacted]d who presents to Maternity Admissions reporting vaginal bleeding & pelvic pain. Symptoms started this afternoon while at work. She went to the bathroom and had saturated a panty liner with bright red blood, there was blood in the toilet & there was more on the toilet paper. Bleeding has continued. Not passing blood clots. Also reports vaginal & pelvic "soreness". Last intercourse was over the weekend. Sees Dr. Charlotta Newton in the office. Has not had her anatomy scan yet. Her blood type is A negative & she has not had rhogam during this pregnancy.   Location: pelvis Quality: sore Severity: 3/10 on pain scale Duration: hours Timing: intermittent Modifying factors: none Associated signs and symptoms: vaginal bleeidng  Past Medical History:  Diagnosis Date  . Headache    OB History  Gravida Para Term Preterm AB Living  1 0 0 0 0 0  SAB TAB Ectopic Multiple Live Births  0 0 0 0      # Outcome Date GA Lbr Len/2nd Weight Sex Delivery Anes PTL Lv  1 Current            Past Surgical History:  Procedure Laterality Date  . APPENDECTOMY    . CHOLECYSTECTOMY  02/2016  . CYST EXCISION  age 77   neck   Social History   Socioeconomic History  . Marital status: Married    Spouse name: Mystic Labo  . Number of children: Not on file  . Years of education: Not on file  . Highest education level: Not on file  Occupational History  . Not on file  Tobacco Use  . Smoking status: Never Smoker  . Smokeless tobacco: Never Used  Substance and Sexual Activity  . Alcohol use: Yes    Alcohol/week: 0.0 standard drinks    Comment: once a week  . Drug use: No  . Sexual activity: Yes  Other Topics Concern  . Not on file  Social History Narrative   She is a Pension scheme manager.   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    She lives with twin sister.    Social Determinants of Health   Financial Resource Strain:   . Difficulty of Paying Living Expenses: Not on file  Food Insecurity:   . Worried About Programme researcher, broadcasting/film/video in the Last Year: Not on file  . Ran Out of Food in the Last Year: Not on file  Transportation Needs:   . Lack of Transportation (Medical): Not on file  . Lack of Transportation (Non-Medical): Not on file  Physical Activity:   . Days of Exercise per Week: Not on file  . Minutes of Exercise per Session: Not on file  Stress:   . Feeling of Stress : Not on file  Social Connections:   . Frequency of Communication with Friends and Family: Not on file  . Frequency of Social Gatherings with Friends and Family: Not on file  . Attends Religious Services: Not on file  . Active Member of Clubs or Organizations: Not on file  . Attends Banker Meetings: Not on file  . Marital Status: Not on file  Intimate Partner Violence:   . Fear of Current or Ex-Partner: Not on file  . Emotionally Abused: Not on file  . Physically Abused: Not on file  . Sexually Abused: Not on  file   Family History  Problem Relation Age of Onset  . Healthy Mother   . Healthy Father    No current facility-administered medications on file prior to encounter.   Current Outpatient Medications on File Prior to Encounter  Medication Sig Dispense Refill  . cetirizine (ZYRTEC) 10 MG tablet Take 10 mg by mouth daily.    . diphenhydrAMINE (BENADRYL) 25 MG tablet Take 25 mg by mouth every 6 (six) hours as needed for allergies.    . fluticasone (FLONASE) 50 MCG/ACT nasal spray Place into both nostrils daily.    . Multiple Vitamin (MULTIVITAMIN) tablet Take 1 tablet by mouth daily.    [name] aspirin-acetaminophen-caffeine (EXCEDRIN MIGRAINE) 250-250-65 MG tablet Take 2 tablets by mouth every 6 (six) hours as needed for headache.    . co-enzyme Q-10 30 MG capsule Take 30 mg by mouth 3 (three) times daily.    [name] ibuprofen  (ADVIL,MOTRIN) 200 MG tablet Take 400 mg by mouth every 6 (six) hours as needed for headache.    . naproxen (NAPROSYN) 500 MG tablet TAKE 1 TABLET (500 MG TOTAL) BY MOUTH EVERY 12 (TWELVE) HOURS AS NEEDED. 16 tablet 2  . propranolol ER (INDERAL LA) 60 MG 24 hr capsule TAKE 1 CAPSULE BY MOUTH EVERY DAY (Patient not taking: Reported on 04/07/2018) 30 capsule 2  . pseudoephedrine (SUDAFED) 30 MG tablet Take 30 mg by mouth every 4 (four) hours as needed for congestion.    . SUMAtriptan (IMITREX) 100 MG tablet TAKE 1 TABLET EARLIEST ONSET OF HEADACHE. MAY REPEAT ONCE IN 2 HOURS IF HEADACHE PERSISTS OR RECURS 10 tablet 3  . vitamin B-12 (CYANOCOBALAMIN) 500 MCG tablet Take 500 mcg by mouth daily.     Allergies  Allergen Reactions  . Cabbage Nausea Only    I have reviewed patient's Past Medical Hx, Surgical Hx, Family Hx, Social Hx, medications and allergies.   Review of Systems  Constitutional: Negative.   Gastrointestinal: Negative.   Genitourinary: Positive for pelvic pain and vaginal bleeding.    OBJECTIVE Patient Vitals for the past 24 hrs:  BP Temp Temp src Pulse Resp SpO2 Height Weight  11/09/19 1832 136/67 98.5 F (36.9 C) Oral (!) 101 18 99 % - -  11/09/19 1631 - - - (!) 109 - - - -  11/09/19 1605 (!) 145/84 99.2 F (37.3 C) Oral (!) 114 20 99 % 5\' 4"  (1.626 m) 111.8 kg   Constitutional: Well-developed, well-nourished female in no acute distress.  Cardiovascular: normal rate & rhythm, no murmur Respiratory: normal rate and effort. Lung sounds clear throughout GI: Abd soft, non-tender, Pos BS x 4. No guarding or rebound tenderness MS: Extremities nontender, no edema, normal ROM Neurologic: Alert and oriented x 4.  GU:     SPECULUM EXAM: NEFG, small amount of dark red blood cleared out with 1 fox swab. No active bleeding. Cervix visually closed.     LAB RESULTS Results for orders placed or performed during the hospital encounter of 11/09/19 (from the past 24 hour(s))  CBC      Status: Abnormal   Collection Time: 11/09/19  4:55 PM  Result Value Ref Range   WBC 13.8 (H) 4.0 - 10.5 K/uL   RBC 4.13 3.87 - 5.11 MIL/uL   Hemoglobin 12.8 12.0 - 15.0 g/dL   HCT 08.8 11.0 - 31.5 %   MCV 88.4 80.0 - 100.0 fL   MCH 31.0 26.0 - 34.0 pg   MCHC 35.1 30.0 - 36.0 g/dL  RDW 12.3 11.5 - 15.5 %   Platelets 222 150 - 400 K/uL   nRBC 0.0 0.0 - 0.2 %  Rh IG workup (includes ABO/Rh)     Status: None (Preliminary result)   Collection Time: 11/09/19  4:59 PM  Result Value Ref Range   Gestational Age(Wks) 17    ABO/RH(D) A NEG    Antibody Screen NEG    Unit Number [id number]    Blood Component Type RHIG    Unit division 00    Status of Unit ISSUED    Transfusion Status      OK TO TRANSFUSE Performed at [facility], [address] [place], [place] [address]     IMAGING [place] MFM OB LIMITED  Result Date: 11/09/2019 ----------------------------------------------------------------------  OBSTETRICS REPORT                       (Signed Final 11/09/2019 05:55 pm) ---------------------------------------------------------------------- Patient Info  ID #:       [id number]                          D.O.B.:  10-09-1990 (28 yrs)  Name:       [name]               Visit Date: 11/09/2019 05:35 pm ---------------------------------------------------------------------- Performed By  Performed By:     Marcellina Millin          Ref. Address:     [address]                    [address]                                                             [address]  Attending:        Noralee Space MD        Secondary Phy.:   [facility] MAU/Triage  Referred By:      Judeth Horn          [facility] ---------------------------------------------------------------------- Orders   #  Description                          Code         Ordered By   1  [place] MFM OB LIMITED                    94854.62     Judeth Horn   ----------------------------------------------------------------------   #  Order #                    Accession #                 Episode #   1  [id number]  [id number]                  [id number]  ---------------------------------------------------------------------- Indications   Vaginal bleeding in pregnancy, second          O46.92   trimester   [redacted] weeks gestation of pregnancy                Z3A.17  ---------------------------------------------------------------------- Fetal Evaluation  Num Of Fetuses:         1  Fetal Heart Rate(bpm):  152  Cardiac Activity:       Observed  Presentation:           Variable  Placenta:               Posterior  P. Cord Insertion:      Visualized  Amniotic Fluid  AFI FV:      Within normal limits  Comment:    Small subchorionic hemorrhage noted measuring 3.8 x 1cm,              seen at internal os of cervix. ---------------------------------------------------------------------- OB History  Gravidity:    1         Term:   0        Prem:   0        SAB:   0  TOP:          0       Ectopic:  0        Living: 0 ---------------------------------------------------------------------- Gestational Age  Best:          17w 1d     Det. By:  Marcella Dubs         EDD:   04/17/20 ---------------------------------------------------------------------- Cervix Uterus Adnexa  Cervix  Length:              5  cm.  Normal appearance by transabdominal scan.  Uterus  No abnormality visualized.  Left Ovary  Not visualized. No adnexal mass visualized.  Right Ovary  Within normal limits.  Cul De Sac  No free fluid seen.  Adnexa  No abnormality visualized. ---------------------------------------------------------------------- Impression  Patient was evaluated for c/o vaginal bleeding and pelvic pain.  A limited ultrasound study was performed. Amniotic fluid is  normal and good fetal activity is seen. A small subchorionic  hemorrhage at the inferior edge of the placenta was seen  (measures 3.8 x  1 cm).  On transabdominal scan, the cervix looks long and closed. ----------------------------------------------------------------------                  Noralee Space, MD Electronically Signed Final Report   11/09/2019 05:55 pm ----------------------------------------------------------------------   MAU COURSE Orders Placed This Encounter  Procedures  . [place] MFM OB LIMITED  . CBC  . Rh IG workup (includes ABO/Rh)  . Discharge patient   Meds ordered this encounter  Medications  . rho (d) immune globulin (RHIG/RHOPHYLAC) injection 300 mcg    MDM FHT present via doppler  RH negative. Rhogam given today  On exam, minimal bleeding & cervix visually closed Ultrasound shows posterior placenta with no previa. Cervical length 5 cm. Small subchorionic hemorrhage at inferior edge of the placenta.   ASSESSMENT 1. Subchorionic hematoma in second trimester, single or unspecified fetus   2. [redacted] weeks gestation of pregnancy   3. Vaginal bleeding in pregnancy, second trimester   4. Rh negative status during pregnancy in second trimester     PLAN Discharge home in stable condition. Bleeding precautions Patient  instructed to keep appointment with Dr. Nelda Marseille this Friday Pelvic rest  Follow-up Information    Janyth Pupa, DO Follow up.   Specialty: Obstetrics and Gynecology Why: keep scheduled appointment or call office as needed Contact information: [place] [place] [address] [phone]        [facility] Follow up.   Specialty: Obstetrics and Gynecology Why: return for worsening symptoms Contact information: [address] [phone] [place] [phone]         Allergies as of 11/09/2019      Reactions   Cabbage Nausea Only      Medication List    STOP taking these medications   aspirin-acetaminophen-caffeine 250-250-65 MG tablet Commonly known as: EXCEDRIN MIGRAINE   ibuprofen 200 MG  tablet Commonly known as: ADVIL   naproxen 500 MG tablet Commonly known as: NAPROSYN   propranolol ER 60 MG 24 hr capsule Commonly known as: INDERAL LA   pseudoephedrine 30 MG tablet Commonly known as: SUDAFED     TAKE these medications   cetirizine 10 MG tablet Commonly known as: ZYRTEC Take 10 mg by mouth daily.   co-enzyme Q-10 30 MG capsule Take 30 mg by mouth 3 (three) times daily.   diphenhydrAMINE 25 MG tablet Commonly known as: BENADRYL Take 25 mg by mouth every 6 (six) hours as needed for allergies.   fluticasone 50 MCG/ACT nasal spray Commonly known as: FLONASE Place into both nostrils daily.   multivitamin tablet Take 1 tablet by mouth daily.   SUMAtriptan 100 MG tablet Commonly known as: IMITREX TAKE 1 TABLET EARLIEST ONSET OF HEADACHE. MAY REPEAT ONCE IN 2 HOURS IF HEADACHE PERSISTS OR RECURS   vitamin B-12 500 MCG tablet Commonly known as: CYANOCOBALAMIN Take 500 mcg by mouth daily.        Jorje Guild, NP 11/09/2019  6:32 PM

## 2019-11-09 NOTE — Progress Notes (Signed)
Fetal doppler 150 obtained.

## 2019-11-09 NOTE — Discharge Instructions (Signed)

## 2019-11-09 NOTE — MAU Note (Signed)
Thinks she is miscarrying.  Started bleeding about an hour ago.  17wks preg. No prior hx of bleeding with preg. This morninig, had a stabbing pain in her crotch, then it became an ache.

## 2019-11-10 LAB — RH IG WORKUP (INCLUDES ABO/RH)
ABO/RH(D): A NEG
Antibody Screen: NEGATIVE
Gestational Age(Wks): 17
Unit division: 0

## 2020-01-24 ENCOUNTER — Encounter: Payer: Self-pay | Admitting: Obstetrics and Gynecology

## 2020-02-02 ENCOUNTER — Encounter: Payer: Self-pay | Admitting: Registered"

## 2020-02-02 ENCOUNTER — Encounter: Payer: BC Managed Care – PPO | Attending: Obstetrics & Gynecology | Admitting: Registered"

## 2020-02-02 ENCOUNTER — Other Ambulatory Visit: Payer: Self-pay

## 2020-02-02 DIAGNOSIS — O99810 Abnormal glucose complicating pregnancy: Secondary | ICD-10-CM | POA: Insufficient documentation

## 2020-02-02 NOTE — Progress Notes (Signed)
Patient was seen on 02/02/20 for Gestational Diabetes self-management class at [facility]. The following learning objectives were met by the patient during this course:   States the definition of Gestational Diabetes  States why dietary management is important in controlling blood glucose  Describes the effects each nutrient has on blood glucose levels  Demonstrates ability to create a balanced meal plan  Demonstrates carbohydrate counting   States when to check blood glucose levels  Demonstrates proper blood glucose monitoring techniques  States the effect of stress and exercise on blood glucose levels  States the importance of limiting caffeine and abstaining from alcohol and smoking  Blood glucose monitor given: [employer] Lot # [id number] x Exp: 11/31/21 Blood glucose reading: 89 mg/dL  Patient instructed to monitor glucose levels: FBS: 60 - <95; 1 hour: <140; 2 hour: <120  Patient received handouts:  Nutrition Diabetes and Pregnancy, including carb counting list  Patient will be seen for follow-up as needed.

## 2020-02-09 ENCOUNTER — Other Ambulatory Visit: Payer: Self-pay | Admitting: Obstetrics & Gynecology

## 2020-02-09 DIAGNOSIS — O24415 Gestational diabetes mellitus in pregnancy, controlled by oral hypoglycemic drugs: Secondary | ICD-10-CM

## 2020-02-24 ENCOUNTER — Encounter: Payer: Self-pay | Admitting: Obstetrics and Gynecology

## 2020-02-29 ENCOUNTER — Ambulatory Visit: Payer: BC Managed Care – PPO | Attending: Obstetrics and Gynecology

## 2020-02-29 ENCOUNTER — Other Ambulatory Visit: Payer: Self-pay

## 2020-02-29 ENCOUNTER — Encounter: Payer: Self-pay | Admitting: *Deleted

## 2020-02-29 ENCOUNTER — Ambulatory Visit: Payer: BC Managed Care – PPO | Admitting: *Deleted

## 2020-02-29 ENCOUNTER — Ambulatory Visit (HOSPITAL_BASED_OUTPATIENT_CLINIC_OR_DEPARTMENT_OTHER): Payer: BC Managed Care – PPO | Admitting: Obstetrics and Gynecology

## 2020-02-29 ENCOUNTER — Other Ambulatory Visit: Payer: Self-pay | Admitting: *Deleted

## 2020-02-29 VITALS — BP 120/71 | HR 74

## 2020-02-29 DIAGNOSIS — J45909 Unspecified asthma, uncomplicated: Secondary | ICD-10-CM

## 2020-02-29 DIAGNOSIS — Z6841 Body Mass Index (BMI) 40.0 and over, adult: Secondary | ICD-10-CM | POA: Diagnosis present

## 2020-02-29 DIAGNOSIS — Z3A33 33 weeks gestation of pregnancy: Secondary | ICD-10-CM | POA: Diagnosis present

## 2020-02-29 DIAGNOSIS — O4593 Premature separation of placenta, unspecified, third trimester: Secondary | ICD-10-CM

## 2020-02-29 DIAGNOSIS — O99213 Obesity complicating pregnancy, third trimester: Secondary | ICD-10-CM

## 2020-02-29 DIAGNOSIS — O24415 Gestational diabetes mellitus in pregnancy, controlled by oral hypoglycemic drugs: Secondary | ICD-10-CM

## 2020-02-29 DIAGNOSIS — O360130 Maternal care for anti-D [Rh] antibodies, third trimester, not applicable or unspecified: Secondary | ICD-10-CM | POA: Diagnosis not present

## 2020-02-29 DIAGNOSIS — Z363 Encounter for antenatal screening for malformations: Secondary | ICD-10-CM | POA: Diagnosis not present

## 2020-02-29 DIAGNOSIS — O24419 Gestational diabetes mellitus in pregnancy, unspecified control: Secondary | ICD-10-CM

## 2020-02-29 DIAGNOSIS — O99891 Other specified diseases and conditions complicating pregnancy: Secondary | ICD-10-CM

## 2020-02-29 DIAGNOSIS — E669 Obesity, unspecified: Secondary | ICD-10-CM

## 2020-02-29 NOTE — Consult Note (Signed)
MFM Note  This patient was seen for an ultrasound due to gestational diabetes that is currently treated with Metformin 1000 mg at bedtime. The patient reports that she was diagnosed with gestational diabetes at around 28 weeks of her current pregnancy.  This is her first pregnancy.  She was placed on Metformin for treatment primarily because her fasting fingerstick values were elevated.  She denies any other problems in her current pregnancy. She was informed that the fetal growth and amniotic fluid level appears appropriate for her gestational age.  The overall EFW measured at the 86th percentile (2500 grams) for her gestational age.  The implications and management of diabetes in pregnancy was discussed in detail with the patient. She was advised that our goals for her fingerstick values are fasting values of 90-95 or less and two-hour postprandials of 120 or less.  Should her fingerstick values be above these values, her Metformin dose may have to be increased or she may have to be started on insulin to help her achieve better glycemic control. The patient was advised that getting her fingerstick values as close to these goals as possible would provide her with the most optimal obstetrical outcome.  Due to gestational diabetes, weekly fetal testing should be performed in your office.  These tests should be continued until delivery.  The patient was advised that should her glycemic control be within normal limits, delivery would be recommended at around 39 weeks.  Should her glycemic control be poor, delivery would be recommended at around 37 weeks.  A vaginal delivery may be attempted as long as the overall EFW is less than 4500 g.  A follow up exam was scheduled in 4 weeks to assess the fetal growth.  A total of 30 minutes was spent counseling and coordinating the care for this patient.  Greater than 50% of the time was spent in direct face-to-face contact.

## 2020-03-21 ENCOUNTER — Other Ambulatory Visit: Payer: Self-pay

## 2020-03-21 ENCOUNTER — Inpatient Hospital Stay (HOSPITAL_COMMUNITY)
Admission: AD | Admit: 2020-03-21 | Discharge: 2020-03-21 | Disposition: A | Discharge status: Home / Self Care | Payer: BC Managed Care – PPO | Attending: Obstetrics & Gynecology | Admitting: Obstetrics & Gynecology

## 2020-03-21 ENCOUNTER — Encounter (HOSPITAL_COMMUNITY): Payer: Self-pay | Admitting: Obstetrics & Gynecology

## 2020-03-21 DIAGNOSIS — O26893 Other specified pregnancy related conditions, third trimester: Secondary | ICD-10-CM

## 2020-03-21 DIAGNOSIS — O24415 Gestational diabetes mellitus in pregnancy, controlled by oral hypoglycemic drugs: Secondary | ICD-10-CM | POA: Insufficient documentation

## 2020-03-21 DIAGNOSIS — Z7984 Long term (current) use of oral hypoglycemic drugs: Secondary | ICD-10-CM | POA: Insufficient documentation

## 2020-03-21 DIAGNOSIS — R030 Elevated blood-pressure reading, without diagnosis of hypertension: Secondary | ICD-10-CM

## 2020-03-21 DIAGNOSIS — Z3A36 36 weeks gestation of pregnancy: Secondary | ICD-10-CM

## 2020-03-21 DIAGNOSIS — R112 Nausea with vomiting, unspecified: Secondary | ICD-10-CM | POA: Diagnosis not present

## 2020-03-21 DIAGNOSIS — O99353 Diseases of the nervous system complicating pregnancy, third trimester: Secondary | ICD-10-CM | POA: Diagnosis not present

## 2020-03-21 DIAGNOSIS — G43909 Migraine, unspecified, not intractable, without status migrainosus: Secondary | ICD-10-CM | POA: Insufficient documentation

## 2020-03-21 DIAGNOSIS — Z3689 Encounter for other specified antenatal screening: Secondary | ICD-10-CM

## 2020-03-21 LAB — PROTEIN / CREATININE RATIO, URINE
Creatinine, Urine: 68.59 mg/dL
Protein Creatinine Ratio: 0.1 mg/mg{Cre} (ref 0.00–0.15)
Total Protein, Urine: 7 mg/dL

## 2020-03-21 LAB — COMPREHENSIVE METABOLIC PANEL
ALT: 25 U/L (ref 0–44)
AST: 26 U/L (ref 15–41)
Albumin: 2.6 g/dL — ABNORMAL LOW (ref 3.5–5.0)
Alkaline Phosphatase: 88 U/L (ref 38–126)
Anion gap: 11 (ref 5–15)
BUN: 13 mg/dL (ref 6–20)
CO2: 20 mmol/L — ABNORMAL LOW (ref 22–32)
Calcium: 9.3 mg/dL (ref 8.9–10.3)
Chloride: 105 mmol/L (ref 98–111)
Creatinine, Ser: 0.57 mg/dL (ref 0.44–1.00)
GFR calc Af Amer: >60 mL/min (ref >60)
GFR calc non Af Amer: >60 mL/min (ref >60)
Glucose, Bld: 85 mg/dL (ref 70–99)
Potassium: 4 mmol/L (ref 3.5–5.1)
Sodium: 136 mmol/L (ref 135–145)
Total Bilirubin: 0.5 mg/dL (ref 0.3–1.2)
Total Protein: 5.8 g/dL — ABNORMAL LOW (ref 6.5–8.1)

## 2020-03-21 LAB — URINALYSIS, ROUTINE W REFLEX MICROSCOPIC
Bilirubin Urine: NEGATIVE
Glucose, UA: NEGATIVE mg/dL
Hgb urine dipstick: NEGATIVE
Ketones, ur: NEGATIVE mg/dL
Leukocytes,Ua: NEGATIVE
Nitrite: NEGATIVE
Protein, ur: NEGATIVE mg/dL
Specific Gravity, Urine: 1.013 (ref 1.005–1.030)
pH: 6 (ref 5.0–8.0)

## 2020-03-21 LAB — CBC
HCT: 39.8 % (ref 36.0–46.0)
Hemoglobin: 13.6 g/dL (ref 12.0–15.0)
MCH: 30.5 pg (ref 26.0–34.0)
MCHC: 34.2 g/dL (ref 30.0–36.0)
MCV: 89.2 fL (ref 80.0–100.0)
Platelets: 188 10*3/uL (ref 150–400)
RBC: 4.46 MIL/uL (ref 3.87–5.11)
RDW: 12.7 % (ref 11.5–15.5)
WBC: 8.5 10*3/uL (ref 4.0–10.5)
nRBC: 0 % (ref 0.0–0.2)

## 2020-03-21 NOTE — MAU Note (Signed)
Sent from MD office for BP evaluation.  Denies H/A, visual disturbances, and epigastric pain.  Endorses +FM.  Denies VB or LOF. 

## 2020-03-21 NOTE — MAU Provider Note (Signed)
History     CSN: [id number]  Arrival date and time: 03/21/20 1240   First Provider Initiated Contact with Patient 03/21/20 1412      Chief Complaint  Patient presents with  . BP Evaluation   Catherine Christian is a 29 y.o. G1P0 at [redacted]w[redacted]d who receives care at [facility].  She presents today for BP Evaluation.  Patient was sent from her office after having a blood pressure of 132/100.  Patient pregnancy history significant for GDM which is managed with Metformin.  Patient endorses fetal movement and denies abdominal cramping or contractions.  She also denies headache, visual disturbances, or RUQ pain.  However, patient states she had a migraine on Friday that was so bad she had nausea and vomiting from onset at 11pm until resolution at 5am on Saturday.     OB History    Gravida  1   Para  0   Term  0   Preterm  0   AB  0   Living  0     SAB  0   TAB  0   Ectopic  0   Multiple  0   Live Births              Past Medical History:  Diagnosis Date  . Asthma    Childhood, exercise induced  . Gestational diabetes   . Headache     Past Surgical History:  Procedure Laterality Date  . APPENDECTOMY    . CHOLECYSTECTOMY  02/2016  . CYST EXCISION  age 69   neck    Family History  Problem Relation Age of Onset  . Healthy Mother   . Healthy Father     Social History   Tobacco Use  . Smoking status: Never Smoker  . Smokeless tobacco: Never Used  Vaping Use  . Vaping Use: Never used  Substance Use Topics  . Alcohol use: Not Currently    Alcohol/week: 0.0 standard drinks    Comment: once a week  . Drug use: No    Allergies:  Allergies  Allergen Reactions  . Cabbage Nausea Only    Medications Prior to Admission  Medication Sig Dispense Refill Last Dose  . cetirizine (ZYRTEC) 10 MG tablet Take 10 mg by mouth daily.   03/21/2020 at 0820  . diphenhydrAMINE (BENADRYL) 25 MG tablet Take 25 mg by mouth every 6 (six) hours as needed for allergies.   Past Week  at Unknown time  . fluticasone (FLONASE) 50 MCG/ACT nasal spray Place into both nostrils daily.   03/21/2020 at 0820  . metFORMIN (GLUCOPHAGE) 1000 MG tablet Take 1,000 mg by mouth every evening.   03/20/2020 at 1845  . Prenatal Vit-Fe Fumarate-FA (MULTIVITAMIN-PRENATAL) 27-0.8 MG TABS tablet Take 1 tablet by mouth daily at 12 noon.   03/21/2020 at 0820  . co-enzyme Q-10 30 MG capsule Take 30 mg by mouth 3 (three) times daily.     . metFORMIN (GLUCOPHAGE-XR) 500 MG 24 hr tablet Take 500 mg by mouth 2 (two) times daily.     . Multiple Vitamin (MULTIVITAMIN) tablet Take 1 tablet by mouth daily.     . SUMAtriptan (IMITREX) 100 MG tablet TAKE 1 TABLET EARLIEST ONSET OF HEADACHE. MAY REPEAT ONCE IN 2 HOURS IF HEADACHE PERSISTS OR RECURS 10 tablet 3 More than a month at Unknown time  . vitamin B-12 (CYANOCOBALAMIN) 500 MCG tablet Take 500 mcg by mouth daily.       Review of Systems  Constitutional: Negative  for chills and fever.  Respiratory: Negative for cough and shortness of breath.   Gastrointestinal: Negative for nausea and vomiting.  Genitourinary: Negative for difficulty urinating, dysuria, vaginal bleeding and vaginal discharge.  Neurological: Negative for dizziness, light-headedness and headaches.   Physical Exam   Blood pressure 132/89, pulse 89, temperature 99 F (37.2 C), temperature source Oral, resp. rate 18, height 5\' 4"  (1.626 m), weight 118.8 kg, last menstrual period 07/12/2019, SpO2 97 %.  Physical Exam Constitutional:      Appearance: Normal appearance. She is normal weight.  HENT:     Head: Normocephalic and atraumatic.  Eyes:     Conjunctiva/sclera: Conjunctivae normal.  Cardiovascular:     Rate and Rhythm: Normal rate and regular rhythm.     Heart sounds: Normal heart sounds.  Pulmonary:     Effort: Pulmonary effort is normal.     Breath sounds: Normal breath sounds.  Abdominal:     General: Bowel sounds are normal.  Skin:    General: Skin is warm and dry.   Neurological:     Mental Status: She is alert and oriented to person, place, and time.  Psychiatric:        Mood and Affect: Mood normal.        Thought Content: Thought content normal.        Judgment: Judgment normal.    Fetal Assessment 145 bpm, Mod Var, -Decels, +Accels Toco: Occasional Ctx  MAU Course   Results for orders placed or performed during the hospital encounter of 03/21/20 (from the past 24 hour(s))  CBC     Status: None   Collection Time: 03/21/20  1:38 PM  Result Value Ref Range   WBC 8.5 4.0 - 10.5 K/uL   RBC 4.46 3.87 - 5.11 MIL/uL   Hemoglobin 13.6 12.0 - 15.0 g/dL   HCT 05/22/20 36 - 46 %   MCV 89.2 80.0 - 100.0 fL   MCH 30.5 26.0 - 34.0 pg   MCHC 34.2 30.0 - 36.0 g/dL   RDW 32.9 51.8 - 84.1 %   Platelets 188 150 - 400 K/uL   nRBC 0.0 0.0 - 0.2 %  Comprehensive metabolic panel     Status: Abnormal   Collection Time: 03/21/20  1:38 PM  Result Value Ref Range   Sodium 136 135 - 145 mmol/L   Potassium 4.0 3.5 - 5.1 mmol/L   Chloride 105 98 - 111 mmol/L   CO2 20 (L) 22 - 32 mmol/L   Glucose, Bld 85 70 - 99 mg/dL   BUN 13 6 - 20 mg/dL   Creatinine, Ser 05/22/20 0.44 - 1.00 mg/dL   Calcium 9.3 8.9 - 6.30 mg/dL   Total Protein 5.8 (L) 6.5 - 8.1 g/dL   Albumin 2.6 (L) 3.5 - 5.0 g/dL   AST 26 15 - 41 U/L   ALT 25 0 - 44 U/L   Alkaline Phosphatase 88 38 - 126 U/L   Total Bilirubin 0.5 0.3 - 1.2 mg/dL   GFR calc non Af Amer >60 >60 mL/min   GFR calc Af Amer >60 >60 mL/min   Anion gap 11 5 - 15  Protein / creatinine ratio, urine     Status: None   Collection Time: 03/21/20  1:39 PM  Result Value Ref Range   Creatinine, Urine 68.59 mg/dL   Total Protein, Urine 7 mg/dL   Protein Creatinine Ratio 0.10 0.00 - 0.15 mg/mg[Cre]  Urinalysis, Routine w reflex microscopic     Status: None  Collection Time: 03/21/20  1:39 PM  Result Value Ref Range   Color, Urine YELLOW YELLOW   APPearance CLEAR CLEAR   Specific Gravity, Urine 1.013 1.005 - 1.030   pH 6.0 5.0 -  8.0   Glucose, UA NEGATIVE NEGATIVE mg/dL   Hgb urine dipstick NEGATIVE NEGATIVE   Bilirubin Urine NEGATIVE NEGATIVE   Ketones, ur NEGATIVE NEGATIVE mg/dL   Protein, ur NEGATIVE NEGATIVE mg/dL   Nitrite NEGATIVE NEGATIVE   Leukocytes,Ua NEGATIVE NEGATIVE   No results found.  MDM Physical Exam Labs: CBC, CMP, PC Ratio Measure BP Q15 min EFM Pain Management Assessment and Plan  29 year old G1P0  SIUP at 36.1weeks Cat I FT Elevated BP  -POC reviewed. -Exam performed and findings discussed. -Discussed MAU availability for management of migraines during pregnancy.  Patient informed that she does not have to suffer through these. -Labs ordered. -NST Reactive. -Will await results.    Cherre Robins MSN, CNM 03/21/2020, 2:12 PM   Reassessment (3:06 PM)  Vitals:   03/21/20 1416 03/21/20 1431 03/21/20 1446 03/21/20 1501  BP: 131/85 (!) 131/93 129/84 135/82  Pulse: 90 86 91 87  Resp:      Temp:      TempSrc:      SpO2:      Weight:      Height:       -Labs return normal. -Precautions given.  Patient strongly encouraged to return to MAU if migraine returns for treatment as appropriate. -Patient scheduled for NST, in office, on Thursday.  Instructed to keep appt. -Informed that diagnosis of GHTN can not be given today as she doesn't meet criteria yet even with in office pressure at 1130am. However, informed that she will/could be diagnosed with next elevation.  -Encouraged to call or return to MAU if symptoms worsen or with the onset of new symptoms. -Discharged to home in stable condition.  Cherre Robins MSN, CNM Advanced Practice Provider, [facility] [employer]

## 2020-03-21 NOTE — Discharge Instructions (Signed)

## 2020-03-28 ENCOUNTER — Encounter: Payer: Self-pay | Admitting: *Deleted

## 2020-03-28 ENCOUNTER — Other Ambulatory Visit: Payer: Self-pay | Admitting: Obstetrics

## 2020-03-28 ENCOUNTER — Encounter (HOSPITAL_COMMUNITY): Payer: Self-pay | Admitting: *Deleted

## 2020-03-28 ENCOUNTER — Other Ambulatory Visit: Payer: Self-pay

## 2020-03-28 ENCOUNTER — Ambulatory Visit: Payer: BC Managed Care – PPO | Attending: Obstetrics

## 2020-03-28 ENCOUNTER — Ambulatory Visit: Payer: BC Managed Care – PPO | Admitting: *Deleted

## 2020-03-28 ENCOUNTER — Telehealth (HOSPITAL_COMMUNITY): Payer: Self-pay | Admitting: *Deleted

## 2020-03-28 VITALS — BP 135/98 | HR 80

## 2020-03-28 DIAGNOSIS — Z362 Encounter for other antenatal screening follow-up: Secondary | ICD-10-CM

## 2020-03-28 DIAGNOSIS — O360130 Maternal care for anti-D [Rh] antibodies, third trimester, not applicable or unspecified: Secondary | ICD-10-CM

## 2020-03-28 DIAGNOSIS — O24419 Gestational diabetes mellitus in pregnancy, unspecified control: Secondary | ICD-10-CM

## 2020-03-28 DIAGNOSIS — O4593 Premature separation of placenta, unspecified, third trimester: Secondary | ICD-10-CM | POA: Diagnosis not present

## 2020-03-28 DIAGNOSIS — O24415 Gestational diabetes mellitus in pregnancy, controlled by oral hypoglycemic drugs: Secondary | ICD-10-CM

## 2020-03-28 DIAGNOSIS — J45909 Unspecified asthma, uncomplicated: Secondary | ICD-10-CM

## 2020-03-28 DIAGNOSIS — O99891 Other specified diseases and conditions complicating pregnancy: Secondary | ICD-10-CM

## 2020-03-28 DIAGNOSIS — O99213 Obesity complicating pregnancy, third trimester: Secondary | ICD-10-CM

## 2020-03-28 DIAGNOSIS — Z3A37 37 weeks gestation of pregnancy: Secondary | ICD-10-CM

## 2020-03-28 LAB — OB RESULTS CONSOLE GBS: GBS: NEGATIVE

## 2020-03-28 NOTE — Telephone Encounter (Signed)
Preadmission screen  

## 2020-03-28 NOTE — Progress Notes (Signed)
States BP has been elevated past week. Was seen in MAU 1 week ago. Had a HA then. None since. Blood work was normal per pt.

## 2020-03-30 ENCOUNTER — Encounter (HOSPITAL_COMMUNITY): Payer: Self-pay | Admitting: *Deleted

## 2020-03-30 ENCOUNTER — Telehealth (HOSPITAL_COMMUNITY): Payer: Self-pay | Admitting: *Deleted

## 2020-03-30 NOTE — Telephone Encounter (Signed)
Preadmission screen  

## 2020-04-01 ENCOUNTER — Other Ambulatory Visit (HOSPITAL_COMMUNITY): Payer: BC Managed Care – PPO

## 2020-04-03 ENCOUNTER — Inpatient Hospital Stay (HOSPITAL_COMMUNITY)
Admission: AD | Admit: 2020-04-03 | Discharge: 2020-04-07 | DRG: 788 | Disposition: A | Discharge status: Home / Self Care | Payer: BC Managed Care – PPO | Attending: Obstetrics and Gynecology | Admitting: Obstetrics and Gynecology

## 2020-04-03 ENCOUNTER — Encounter (HOSPITAL_COMMUNITY): Payer: Self-pay | Admitting: Obstetrics and Gynecology

## 2020-04-03 ENCOUNTER — Other Ambulatory Visit: Payer: Self-pay

## 2020-04-03 DIAGNOSIS — Z3A38 38 weeks gestation of pregnancy: Secondary | ICD-10-CM | POA: Diagnosis not present

## 2020-04-03 DIAGNOSIS — Z3689 Encounter for other specified antenatal screening: Secondary | ICD-10-CM

## 2020-04-03 DIAGNOSIS — O3663X0 Maternal care for excessive fetal growth, third trimester, not applicable or unspecified: Secondary | ICD-10-CM | POA: Diagnosis present

## 2020-04-03 DIAGNOSIS — O99214 Obesity complicating childbirth: Secondary | ICD-10-CM | POA: Diagnosis present

## 2020-04-03 DIAGNOSIS — O133 Gestational [pregnancy-induced] hypertension without significant proteinuria, third trimester: Secondary | ICD-10-CM

## 2020-04-03 DIAGNOSIS — Z20822 Contact with and (suspected) exposure to covid-19: Secondary | ICD-10-CM | POA: Diagnosis present

## 2020-04-03 DIAGNOSIS — O1404 Mild to moderate pre-eclampsia, complicating childbirth: Principal | ICD-10-CM | POA: Diagnosis present

## 2020-04-03 DIAGNOSIS — O24425 Gestational diabetes mellitus in childbirth, controlled by oral hypoglycemic drugs: Secondary | ICD-10-CM | POA: Diagnosis present

## 2020-04-03 DIAGNOSIS — O24415 Gestational diabetes mellitus in pregnancy, controlled by oral hypoglycemic drugs: Secondary | ICD-10-CM

## 2020-04-03 DIAGNOSIS — R519 Headache, unspecified: Secondary | ICD-10-CM | POA: Diagnosis present

## 2020-04-03 DIAGNOSIS — O139 Gestational [pregnancy-induced] hypertension without significant proteinuria, unspecified trimester: Secondary | ICD-10-CM | POA: Diagnosis present

## 2020-04-03 DIAGNOSIS — O1493 Unspecified pre-eclampsia, third trimester: Secondary | ICD-10-CM

## 2020-04-03 LAB — COMPREHENSIVE METABOLIC PANEL
ALT: 23 U/L (ref 0–44)
AST: 26 U/L (ref 15–41)
Albumin: 2.5 g/dL — ABNORMAL LOW (ref 3.5–5.0)
Alkaline Phosphatase: 94 U/L (ref 38–126)
Anion gap: 11 (ref 5–15)
BUN: 9 mg/dL (ref 6–20)
CO2: 21 mmol/L — ABNORMAL LOW (ref 22–32)
Calcium: 9.3 mg/dL (ref 8.9–10.3)
Chloride: 106 mmol/L (ref 98–111)
Creatinine, Ser: 0.71 mg/dL (ref 0.44–1.00)
GFR calc Af Amer: >60 mL/min (ref >60)
GFR calc non Af Amer: >60 mL/min (ref >60)
Glucose, Bld: 99 mg/dL (ref 70–99)
Potassium: 4 mmol/L (ref 3.5–5.1)
Sodium: 138 mmol/L (ref 135–145)
Total Bilirubin: 0.8 mg/dL (ref 0.3–1.2)
Total Protein: 5.7 g/dL — ABNORMAL LOW (ref 6.5–8.1)

## 2020-04-03 LAB — SARS CORONAVIRUS 2 BY RT PCR (HOSPITAL ORDER, PERFORMED IN ~~LOC~~ HOSPITAL LAB): SARS Coronavirus 2: NEGATIVE

## 2020-04-03 LAB — URINALYSIS, ROUTINE W REFLEX MICROSCOPIC
Bilirubin Urine: NEGATIVE
Glucose, UA: NEGATIVE mg/dL
Hgb urine dipstick: NEGATIVE
Ketones, ur: NEGATIVE mg/dL
Leukocytes,Ua: NEGATIVE
Nitrite: NEGATIVE
Protein, ur: NEGATIVE mg/dL
Specific Gravity, Urine: 1.006 (ref 1.005–1.030)
pH: 6 (ref 5.0–8.0)

## 2020-04-03 LAB — TYPE AND SCREEN
ABO/RH(D): A NEG
Antibody Screen: NEGATIVE

## 2020-04-03 LAB — CBC
HCT: 37.4 % (ref 36.0–46.0)
Hemoglobin: 12.8 g/dL (ref 12.0–15.0)
MCH: 30.1 pg (ref 26.0–34.0)
MCHC: 34.2 g/dL (ref 30.0–36.0)
MCV: 88 fL (ref 80.0–100.0)
Platelets: 151 10*3/uL (ref 150–400)
RBC: 4.25 MIL/uL (ref 3.87–5.11)
RDW: 12.7 % (ref 11.5–15.5)
WBC: 8 10*3/uL (ref 4.0–10.5)
nRBC: 0 % (ref 0.0–0.2)

## 2020-04-03 LAB — PROTEIN / CREATININE RATIO, URINE
Creatinine, Urine: 45.54 mg/dL
Protein Creatinine Ratio: 0.48 mg/mg{Cre} — ABNORMAL HIGH (ref 0.00–0.15)
Total Protein, Urine: 22 mg/dL

## 2020-04-03 LAB — GLUCOSE, CAPILLARY
Glucose-Capillary: 84 mg/dL (ref 70–99)
Glucose-Capillary: 118 mg/dL — ABNORMAL HIGH (ref 70–99)
Glucose-Capillary: 107 mg/dL — ABNORMAL HIGH (ref 70–99)

## 2020-04-03 MED ORDER — DOCUSATE SODIUM 100 MG PO CAPS: 100.0000 mg | ORAL_CAPSULE | Freq: Every day | ORAL | Status: DC

## 2020-04-03 MED ORDER — ZOLPIDEM TARTRATE 5 MG PO TABS: 5.0000 mg | ORAL_TABLET | Freq: Every evening | ORAL | Status: DC | PRN

## 2020-04-03 MED ORDER — PRENATAL MULTIVITAMIN CH: 1.0000 | ORAL_TABLET | Freq: Every day | ORAL | Status: DC

## 2020-04-03 MED ORDER — LACTATED RINGERS IV SOLN: INTRAVENOUS | Status: DC

## 2020-04-03 MED ORDER — ACETAMINOPHEN 325 MG PO TABS
650.0000 mg | ORAL_TABLET | ORAL | Status: DC | PRN
  Administered 2020-04-03: 650 mg via ORAL
  Filled 2020-04-03: qty 2

## 2020-04-03 MED ORDER — SOD CITRATE-CITRIC ACID 500-334 MG/5ML PO SOLN
30.0000 mL | ORAL | Status: AC
  Administered 2020-04-04: 30 mL via ORAL
  Filled 2020-04-03: qty 30

## 2020-04-03 MED ORDER — METFORMIN HCL 500 MG PO TABS
1000.0000 mg | ORAL_TABLET | Freq: Every day | ORAL | Status: AC
  Administered 2020-04-03: 1000 mg via ORAL
  Filled 2020-04-03: qty 2

## 2020-04-03 MED ORDER — CALCIUM CARBONATE ANTACID 500 MG PO CHEW: 2.0000 | CHEWABLE_TABLET | ORAL | Status: DC | PRN

## 2020-04-03 NOTE — MAU Provider Note (Signed)
History     CSN: [id number]  Arrival date and time: 04/03/20 1031   First Provider Initiated Contact with Patient 04/03/20 1210      Chief Complaint  Patient presents with  . Hypertension   Ms. [name] is a 29 y.o. G1P0000 at [redacted]w[redacted]d who presents to MAU for preeclampsia evaluation after she called the office to report an elevated BP at home of 144/100 and was told to come in for evaluation. Patient reports she has had elevated pressures prior during her office visits.  Pt denies HA, blurry vision/seeing spots, N/V, epigastric pain, swelling in face and hands, sudden weight gain. Pt denies chest pain and SOB.  Pt denies constipation, diarrhea, or urinary problems. Pt denies fever, chills, fatigue, sweating or changes in appetite. Pt denies dizziness, light-headedness, weakness.  Pt denies VB, ctx, LOF and reports good FM.  Current pregnancy problems? GDM - Metformin Blood Type? A NEGATIVE Allergies? Cabbage, NKDA Current medications? Metformin, PNVs, Zyrtec, Flonase, Tylenol (last took yesterday) Current [facility] & next appt? [facility], 04/04/2020  Of note, pt is scheduled for C/S for macrosomia on 04/11/2020.   OB History    Gravida  1   Para  0   Term  0   Preterm  0   AB  0   Living  0     SAB  0   TAB  0   Ectopic  0   Multiple  0   Live Births              Past Medical History:  Diagnosis Date  . Adnexal cyst   . Asthma    Childhood, exercise induced  . Gestational diabetes   . Headache   . Hepatic steatosis     Past Surgical History:  Procedure Laterality Date  . APPENDECTOMY    . CHOLECYSTECTOMY  02/2016  . CYST EXCISION  age 49   neck    Family History  Problem Relation Age of Onset  . Healthy Mother   . Healthy Father   . Diabetes Paternal Grandmother   . Hypertension Paternal Grandmother     Social History   Tobacco Use  . Smoking status: Never Smoker  . Smokeless tobacco: Never Used  Vaping Use  . Vaping Use:  Never used  Substance Use Topics  . Alcohol use: Not Currently    Alcohol/week: 0.0 standard drinks    Comment: once a week  . Drug use: No    Allergies:  Allergies  Allergen Reactions  . Cabbage Nausea Only    Medications Prior to Admission  Medication Sig Dispense Refill Last Dose  . cetirizine (ZYRTEC) 10 MG tablet Take 10 mg by mouth daily.   04/03/2020 at Unknown time  . diphenhydrAMINE (BENADRYL) 25 MG tablet Take 25 mg by mouth every 6 (six) hours as needed for allergies.   04/02/2020 at Unknown time  . fluticasone (FLONASE) 50 MCG/ACT nasal spray Place 1 spray into both nostrils daily.    Past Week at Unknown time  . metFORMIN (GLUCOPHAGE-XR) 500 MG 24 hr tablet Take 1,000 mg by mouth every evening.    04/02/2020 at Unknown time  . Prenatal Vit-Fe Fumarate-FA (MULTIVITAMIN-PRENATAL) 27-0.8 MG TABS tablet Take 1 tablet by mouth daily at 12 noon.   04/03/2020 at Unknown time    Review of Systems  Constitutional: Negative for chills, diaphoresis, fatigue and fever.  Eyes: Negative for visual disturbance.  Respiratory: Negative for shortness of breath.   Cardiovascular: Negative for  chest pain.  Gastrointestinal: Negative for abdominal pain, constipation, diarrhea, nausea and vomiting.  Genitourinary: Negative for dysuria, flank pain, frequency, pelvic pain, urgency, vaginal bleeding and vaginal discharge.  Neurological: Negative for dizziness, weakness, light-headedness and headaches.   Physical Exam   Blood pressure 138/90, pulse 80, temperature 98.3 F (36.8 C), temperature source Oral, resp. rate 18, last menstrual period 07/12/2019, SpO2 97 %.  Patient Vitals for the past 24 hrs:  BP Temp Temp src Pulse Resp SpO2  04/03/20 1200 138/90 -- -- 80 -- 97 %  04/03/20 1146 138/87 -- -- 84 -- --  04/03/20 1131 137/80 -- -- 81 -- --  04/03/20 1116 (!) 137/93 -- -- 88 -- --  04/03/20 1109 132/85 -- -- 87 -- --  04/03/20 1042 (!) 135/91 98.3 F (36.8 C) Oral 92 18 --  04/03/20  1041 -- -- -- -- -- 98 %   Physical Exam Constitutional:      General: She is not in acute distress.    Appearance: She is well-developed. She is not diaphoretic.  HENT:     Head: Normocephalic and atraumatic.  Pulmonary:     Effort: Pulmonary effort is normal.  Abdominal:     General: There is no distension.  Neurological:     Mental Status: She is alert and oriented to person, place, and time.  Psychiatric:        Behavior: Behavior normal.        Thought Content: Thought content normal.        Judgment: Judgment normal.    Results for orders placed or performed during the hospital encounter of 04/03/20 (from the past 24 hour(s))  Urinalysis, Routine w reflex microscopic     Status: Abnormal   Collection Time: 04/03/20 11:30 AM  Result Value Ref Range   Color, Urine YELLOW YELLOW   APPearance HAZY (A) CLEAR   Specific Gravity, Urine 1.006 1.005 - 1.030   pH 6.0 5.0 - 8.0   Glucose, UA NEGATIVE NEGATIVE mg/dL   Hgb urine dipstick NEGATIVE NEGATIVE   Bilirubin Urine NEGATIVE NEGATIVE   Ketones, ur NEGATIVE NEGATIVE mg/dL   Protein, ur NEGATIVE NEGATIVE mg/dL   Nitrite NEGATIVE NEGATIVE   Leukocytes,Ua NEGATIVE NEGATIVE    US MFM FETAL BPP WO NON STRESS  Result Date: 03/29/2020 ----------------------------------------------------------------------  OBSTETRICS REPORT                       (Signed Final 03/29/2020 12:34 pm) ---------------------------------------------------------------------- Patient Info  ID #:       [id number]                          D.O.B.:  25-May-1991 (29 yrs)  Name:       Catherine Christian               Visit Date: 03/28/2020 02:19 pm ---------------------------------------------------------------------- Performed By  Attending:        Lin Landsmanorenthian Booker      Ref. Address:     [address]                                                             [address]                                                              [place], [place]                                                             [address]  Performed By:     Tomma Lightning             Secondary Phy.:   [facility] MAU/Triage                    RDMS,RVT  Referred By:      Alessandra Bevels             Location[facility]         [facility]                                  [facility] ---------------------------------------------------------------------- Orders  #  Description                           Code        Ordered By  1  [place] MFM OB FOLLOW UP                   76816.01    YU FANG  2  [place] MFM FETAL BPP WO NON               01027.25    YU FANG     STRESS ----------------------------------------------------------------------  #  Order #                     Accession #                Episode #  1  [id number]                   [id number]                 [id number]  2  [id number]                   [id number]                 [id number] ---------------------------------------------------------------------- Indications  Gestational diabetes in pregnancy,             O24.415  controlled by oral hypoglycemic drugs  Maternal morbid obesity                        O99.210 E66.01  [redacted] weeks gestation of pregnancy                Z3A.37  Asthma                                         O99.89 j45.909  Rh negative state in antepartum (Rhogam        O36.0190  given 11-09-19)  Subchorionic hemorrhage, antepartum (17        O45.90  weeks- No VB since)  Declined genetic testing  Encounter for other antenatal screening        Z36.2  follow-up ---------------------------------------------------------------------- Fetal Evaluation  Num Of Fetuses:         1  Fetal Heart Rate(bpm):  138  Cardiac Activity:       Observed  Presentation:           Cephalic  Placenta:               Posterior  P. Cord Insertion:       Not well visualized  Amniotic Fluid  AFI FV:      Within normal limits  AFI Sum(cm)     %Tile       Largest Pocket(cm)  13.9            52          4.8  RUQ(cm)       RLQ(cm)       LUQ(cm)        LLQ(cm)  4.7           2.3           4.8            2.1 ---------------------------------------------------------------------- Biophysical Evaluation  Amniotic F.V:   Within normal limits       F. Tone:        Observed  F. Movement:    Observed                   Score:          8/8  F. Breathing:   Observed ---------------------------------------------------------------------- Biometry  BPD:      97.6  mm     G. Age:  40w 0d       > 99  %    CI:        73.08   %    70 - 86  FL/HC:      19.7   %    20.8 - 22.6  HC:      362.9  mm     G. Age:  42w 6d       > 99  %    HC/AC:      0.97        0.92 - 1.05  AC:      375.4  mm     G. Age:  41w 3d       > 99  %    FL/BPD:     73.2   %    71 - 87  FL:       71.4  mm     G. Age:  36w 4d         35  %    FL/AC:      19.0   %    20 - 24  HUM:      64.9  mm     G. Age:  37w 4d         81  %  Est. FW:    4119  gm      9 lb 1 oz   > 99  % ---------------------------------------------------------------------- OB History  Gravidity:    1         Term:   0        Prem:   0        SAB:   0  TOP:          0       Ectopic:  0        Living: 0 ---------------------------------------------------------------------- Gestational Age  U/S Today:     40w 2d                                        EDD:   03/26/20  Best:          37w 1d     Det. By:  Marcella Dubs         EDD:   04/17/20 ---------------------------------------------------------------------- Anatomy  Cranium:               Appears normal         Aortic Arch:            Not well visualized  Cavum:                 Previously seen        Ductal Arch:            Not well visualized  Ventricles:            Previously seen        Diaphragm:              Previously seen  Choroid Plexus:         Previously seen        Stomach:                Appears normal, left  sided  Cerebellum:            Not well visualized    Abdomen:                Appears normal  Posterior Fossa:       Not well visualized    Abdominal Wall:         Not well visualized  Nuchal Fold:           Not applicable (>20    Cord Vessels:           Previously seen                         wks GA)  Face:                  Not well visualized    Kidneys:                Appear normal  Lips:                  Previously seen        Bladder:                Previously seen  Palate:                Not well visualized    Spine:                  Limited views                                                                        appear nl prev seen  Heart:                 Not well visualized    Upper Extremities:      prev Visualized  RVOT:                  Not well visualized    Lower Extremities:      prev Visualized  LVOT:                  Not well visualized  Other:  Fetus appears to be a female. Technically difficult due to advanced GA          and fetal position, and maternal body habitus. ---------------------------------------------------------------------- Cervix Uterus Adnexa  Cervix  Not visualized (advanced GA >24wks) ---------------------------------------------------------------------- Impression  Follow up growth for A2GDM  Normal interval growth with measurements greater than  dates. Ms. Ton EFW 99th%. She reports good blood  sugar control.  Suboptimal views of the fetal anatomy were again observed,  secondary to fetal position and maternal habitus.  Biophysical profile 8/8 there is good fetal movement and  amniotic fluid.  She continue metformin 1000mg  at bedtime. ---------------------------------------------------------------------- Recommendations  Continue weekly testing  Consider delivery by 39 weeks, sooner if maternal blood  sugar is poorly controlled.  ----------------------------------------------------------------------               Lin Landsman, MD Electronically Signed Final Report   03/29/2020 12:34 pm ----------------------------------------------------------------------  [place] MFM OB FOLLOW UP  Result Date: 03/29/2020 ----------------------------------------------------------------------  OBSTETRICS REPORT                       (  Signed Final 03/29/2020 12:34 pm) ---------------------------------------------------------------------- Patient Info  ID #:       [id number]                          D.O.B.:  01-24-1991 (29 yrs)  Name:       Catherine Christian               Visit Date: 03/28/2020 02:19 pm ---------------------------------------------------------------------- Performed By  Attending:        Lin Landsman      Ref. Address:     [address]                                                             [facility]                                                             [address]                                                             [place], [place]                                                             [address]  Performed By:     Tomma Lightning             Secondary Phy.:   [facility] MAU/Triage                    RDMS,RVT  Referred By:      Alessandra Bevels             Location:         [facility] ---------------------------------------------------------------------- Orders  #  Description                           Code        Ordered By  1  [place] MFM OB FOLLOW UP                   [phone]    Rosana Hoes  2  [place] MFM FETAL BPP WO NON               [id number]    YU FANG     STRESS ----------------------------------------------------------------------  #  Order #                     Accession #                Episode #  1   [id number]                   [id number]                 [id number]  2  [id number]                   [id number]                 [id number] ---------------------------------------------------------------------- Indications  Gestational diabetes in pregnancy,             O24.415  controlled by oral hypoglycemic drugs  Maternal morbid obesity                        O99.210 E66.01  [redacted] weeks gestation of pregnancy                Z3A.37  Asthma                                         O99.89 j45.909  Rh negative state in antepartum (Rhogam        O36.0190  given 11-09-19)  Subchorionic hemorrhage, antepartum (17        O45.90  weeks- No VB since)  Declined genetic testing  Encounter for other antenatal screening        Z36.2  follow-up ---------------------------------------------------------------------- Fetal Evaluation  Num Of Fetuses:         1  Fetal Heart Rate(bpm):  138  Cardiac Activity:       Observed  Presentation:           Cephalic  Placenta:               Posterior  P. Cord Insertion:      Not well visualized  Amniotic Fluid  AFI FV:      Within normal limits  AFI Sum(cm)     %Tile       Largest Pocket(cm)  13.9            52          4.8  RUQ(cm)       RLQ(cm)       LUQ(cm)        LLQ(cm)  4.7           2.3           4.8            2.1 ----------------------------------------------------------------------  Biophysical Evaluation  Amniotic F.V:   Within normal limits       F. Tone:        Observed  F. Movement:    Observed                   Score:          8/8  F. Breathing:   Observed ---------------------------------------------------------------------- Biometry  BPD:      97.6  mm     G. Age:  40w 0d       > 99  %    CI:        73.08   %    70 - 86                                                          FL/HC:      19.7   %    20.8 - 22.6  HC:      362.9  mm     G. Age:  42w 6d       > 99  %    HC/AC:      0.97        0.92 - 1.05  AC:      375.4  mm     G. Age:  41w 3d       > 99  %    FL/BPD:     73.2   %    71  - 87  FL:       71.4  mm     G. Age:  36w 4d         35  %    FL/AC:      19.0   %    20 - 24  HUM:      64.9  mm     G. Age:  37w 4d         81  %  Est. FW:    4119  gm      9 lb 1 oz   > 99  % ---------------------------------------------------------------------- OB History  Gravidity:    1         Term:   0        Prem:   0        SAB:   0  TOP:          0       Ectopic:  0        Living: 0 ---------------------------------------------------------------------- Gestational Age  U/S Today:     40w 2d                                        EDD:   03/26/20  Best:          37w 1d     Det. By:  Marcella Dubs         EDD:   04/17/20 ---------------------------------------------------------------------- Anatomy  Cranium:               Appears normal         Aortic Arch:            Not well visualized  Cavum:                 Previously seen        Ductal Arch:            Not well visualized  Ventricles:            Previously seen        Diaphragm:              Previously seen  Choroid Plexus:        Previously seen        Stomach:                Appears normal, left                                                                        sided  Cerebellum:            Not well visualized    Abdomen:                Appears normal  Posterior Fossa:       Not well visualized    Abdominal Wall:         Not well visualized  Nuchal Fold:           Not applicable (>20    Cord Vessels:           Previously seen                         wks GA)  Face:                  Not well visualized    Kidneys:                Appear normal  Lips:                  Previously seen        Bladder:                Previously seen  Palate:                Not well visualized    Spine:                  Limited views                                                                        appear nl prev seen  Heart:                 Not well visualized    Upper Extremities:      prev Visualized  RVOT:                  Not well visualized    Lower  Extremities:      prev Visualized  LVOT:  Not well visualized  Other:  Fetus appears to be a female. Technically difficult due to advanced GA          and fetal position, and maternal body habitus. ---------------------------------------------------------------------- Cervix Uterus Adnexa  Cervix  Not visualized (advanced GA >24wks) ---------------------------------------------------------------------- Impression  Follow up growth for A2GDM  Normal interval growth with measurements greater than  dates. Ms. Enneking EFW 99th%. She reports good blood  sugar control.  Suboptimal views of the fetal anatomy were again observed,  secondary to fetal position and maternal habitus.  Biophysical profile 8/8 there is good fetal movement and  amniotic fluid.  She continue metformin 1000mg  at bedtime. ---------------------------------------------------------------------- Recommendations  Continue weekly testing  Consider delivery by 39 weeks, sooner if maternal blood  sugar is poorly controlled. ----------------------------------------------------------------------               , MD Electronically Signed Final Report   03/29/2020 12:34 pm ----------------------------------------------------------------------   MAU Course  Procedures  MDM -labs entered and patient noted to have elevated pressures at MFM visit on 03/28/2020 -called Dr. 03/30/2020 to notify of new diagnosis of gHTN and recommend delivery; per Dr. Richardson Dopp. Will admit to [facility] for observation and C/S will take place tomorrow with Dr. [facility]. Care turned over to Dr. Charlotta Newton. -EFM: reactive       -baseline: 120       -variability: moderate       -accels: present, 15x15       -decels: absent       -TOCO: few, irregular -admit to [facility], labs pending  Orders Placed This Encounter  Procedures  . Urinalysis, Routine w reflex microscopic    Standing Status:   Standing    Number of Occurrences:   1  . CBC     Standing Status:   Standing    Number of Occurrences:   1  . Comprehensive metabolic panel    Standing Status:   Standing    Number of Occurrences:   1  . Protein / creatinine ratio, urine    Standing Status:   Standing    Number of Occurrences:   1   No orders of the defined types were placed in this encounter.  Assessment and Plan   1. Gestational hypertension, third trimester   2. [redacted] weeks gestation of pregnancy   3. NST (non-stress test) reactive    -admit to [facility]  [facility] Shiri Hodapp 04/03/2020, 12:37 PM

## 2020-04-03 NOTE — MAU Note (Signed)
263.7 Pt reports feeling "off" for past few days.  Had neighbor check her BP and it was 144/104.  Denies HA or visual changes.   Pt reports having a low grade fever for past few days 99-101. Today is 97 per patient.  Pt called Dr office and she was told to come in for BP eval.  Reports good fetal movement.

## 2020-04-03 NOTE — H&P (Signed)
Catherine Christian is a 29 y.o. female G1P0 at 101 wks and 0 days presented to [facility]U complaining of increased bp at home and headache over the weekend and early this mornining that was resolved. BP on arrival was 135/91.[name] on 07/13 pt was seen in [facility] and bp was 144/97 and 135/98.[name] PCR is 0.48  she therefore meets criteria for preeclampsia without severe features. She reports mild intermittent headache ( but has these even when not pregnant) no ruq pain on visual disturbances . [name] Her prengnancy is complicated by GDM on metformin 1000 mg daily.. she has macrosomia with u/s efw of 9 lbs 1 oz on 03/29/2020. She is scheduled for primary cesarean section on 04/11/2020.  Prenanal care provided by Dr. Myna Hidalgo with [facility]    OB History    Gravida  1   Para  0   Term  0   Preterm  0   AB  0   Living  0     SAB  0   TAB  0   Ectopic  0   Multiple  0   Live Births             Past Medical History:  Diagnosis Date  . Adnexal cyst   . Asthma    Childhood, exercise induced  . Gestational diabetes   . Headache   . Hepatic steatosis    Past Surgical History:  Procedure Laterality Date  . APPENDECTOMY    . CHOLECYSTECTOMY  02/2016  . CYST EXCISION  age 45   neck   Family History: family history includes Diabetes in her paternal grandmother; Healthy in her father and mother; Hypertension in her paternal grandmother. Social History:  reports that she has never smoked. She has never used smokeless tobacco. She reports previous alcohol use. She reports that she does not use drugs.     Maternal Diabetes: Yes:  Diabetes Type:  Insulin/Medication controlled Genetic Screening: Normal Maternal Ultrasounds/Referrals: Normal Fetal Ultrasounds or other Referrals:  None Maternal Substance Abuse:  No Significant Maternal Medications:  Meds include: Other:  Significant Maternal Lab Results:  Group B Strep negative Other Comments:  patient is on metformin 1000 mg daily for  GDM  Review of Systems History   Blood pressure (!) 133/92, pulse 77, temperature 98.3 F (36.8 C), temperature source Oral, resp. rate 18, last menstrual period 07/12/2019, SpO2 97 %. Exam Physical Exam Vitals reviewed.  Constitutional:      Appearance: Normal appearance.  HENT:     Head: Normocephalic and atraumatic.     Nose: Nose normal.     Mouth/Throat:     Mouth: Mucous membranes are moist.  Cardiovascular:     Rate and Rhythm: Normal rate and regular rhythm.  Pulmonary:     Effort: Pulmonary effort is normal.  Abdominal:     Tenderness: There is no abdominal tenderness.  Musculoskeletal:        General: Normal range of motion.     Cervical back: Normal range of motion and neck supple.  Skin:    General: Skin is warm and dry.  Neurological:     General: No focal deficit present.     Mental Status: She is alert and oriented to person, place, and time.  Psychiatric:        Mood and Affect: Mood normal.        Behavior: Behavior normal.     Prenatal labs: ABO, Rh: --/--/A NEG (07/19 1225) Antibody: PENDING (07/19 1225) Rubella:  Immune (12/14 0000) RPR: Nonreactive (12/14 0000)  HBsAg: Negative (12/14 0000)  HIV: Non-reactive (12/14 0000)  GBS: Negative/-- (07/13 0000)   Results for orders placed or performed during the hospital encounter of 04/03/20 (from the past 24 hour(s))  Urinalysis, Routine w reflex microscopic     Status: Abnormal   Collection Time: 04/03/20 11:30 AM  Result Value Ref Range   Color, Urine YELLOW YELLOW   APPearance HAZY (A) CLEAR   Specific Gravity, Urine 1.006 1.005 - 1.030   pH 6.0 5.0 - 8.0   Glucose, UA NEGATIVE NEGATIVE mg/dL   Hgb urine dipstick NEGATIVE NEGATIVE   Bilirubin Urine NEGATIVE NEGATIVE   Ketones, ur NEGATIVE NEGATIVE mg/dL   Protein, ur NEGATIVE NEGATIVE mg/dL   Nitrite NEGATIVE NEGATIVE   Leukocytes,Ua NEGATIVE NEGATIVE  Protein / creatinine ratio, urine     Status: Abnormal   Collection Time: 04/03/20 11:32  AM  Result Value Ref Range   Creatinine, Urine 45.54 mg/dL   Total Protein, Urine 22 mg/dL   Protein Creatinine Ratio 0.48 (H) 0.00 - 0.15 mg/mg[Cre]  CBC     Status: None   Collection Time: 04/03/20 12:25 PM  Result Value Ref Range   WBC 8.0 4.0 - 10.5 K/uL   RBC 4.25 3.87 - 5.11 MIL/uL   Hemoglobin 12.8 12.0 - 15.0 g/dL   HCT 02.1 36 - 46 %   MCV 88.0 80.0 - 100.0 fL   MCH 30.1 26.0 - 34.0 pg   MCHC 34.2 30.0 - 36.0 g/dL   RDW 11.5 52.0 - 80.2 %   Platelets 151 150 - 400 K/uL   nRBC 0.0 0.0 - 0.2 %  Comprehensive metabolic panel     Status: Abnormal   Collection Time: 04/03/20 12:25 PM  Result Value Ref Range   Sodium 138 135 - 145 mmol/L   Potassium 4.0 3.5 - 5.1 mmol/L   Chloride 106 98 - 111 mmol/L   CO2 21 (L) 22 - 32 mmol/L   Glucose, Bld 99 70 - 99 mg/dL   BUN 9 6 - 20 mg/dL   Creatinine, Ser 2.33 0.44 - 1.00 mg/dL   Calcium 9.3 8.9 - 61.2 mg/dL   Total Protein 5.7 (L) 6.5 - 8.1 g/dL   Albumin 2.5 (L) 3.5 - 5.0 g/dL   AST 26 15 - 41 U/L   ALT 23 0 - 44 U/L   Alkaline Phosphatase 94 38 - 126 U/L   Total Bilirubin 0.8 0.3 - 1.2 mg/dL   GFR calc non Af Amer >60 >60 mL/min   GFR calc Af Amer >60 >60 mL/min   Anion gap 11 5 - 15  Type and screen [facility]     Status: None   Collection Time: 04/03/20 12:25 PM  Result Value Ref Range   ABO/RH(D) A NEG    Antibody Screen NEG    Sample Expiration      04/06/2020,2359 Performed at [facility], [address] [place], [place] [address]   SARS Coronavirus 2 by RT PCR (hospital order, performed in [facility] hospital lab) Nasopharyngeal Nasopharyngeal Swab     Status: None   Collection Time: 04/03/20  1:07 PM   Specimen: Nasopharyngeal Swab  Result Value Ref Range   SARS Coronavirus 2 NEGATIVE NEGATIVE  Glucose, capillary     Status: None   Collection Time: 04/03/20  2:32 PM  Result Value Ref Range   Glucose-Capillary 84 70 - 99 mg/dL  Glucose, capillary  Status: Abnormal   Collection  Time: 04/03/20  4:48 PM  Result Value Ref Range   Glucose-Capillary 118 (H) 70 - 99 mg/dL   Assessment/Plan: 38 wks and 0 days with preeclampsia without severe features ,  GDM and Macrosomia ( primary cestion originally scheduled for 07/27). In light of her diagnosis of gestational hypertension this has been scheduled for tomorrow at 430 pm. Will admit overnight for observation and serial blood pressures.   -RBA of cesarean section reviewed with the patient including but not limited to infection , bleeding, damage to bowel bladder and baby with the need for further surgery. R/o Transfusion reviewed.  - pt to be NPO after 8 am 04/04/2020  - GDM.[name] fasting cbg and 2 hour pprandial continue metformin today .. hold after midnight.    Gerald Leitz 04/03/2020, 1:10 PM

## 2020-04-04 ENCOUNTER — Inpatient Hospital Stay (HOSPITAL_COMMUNITY)
Admission: AD | Admit: 2020-04-04 | Payer: BC Managed Care – PPO | Source: Home / Self Care | Admitting: Obstetrics & Gynecology

## 2020-04-04 ENCOUNTER — Encounter (HOSPITAL_COMMUNITY): Payer: Self-pay | Admitting: Obstetrics and Gynecology

## 2020-04-04 ENCOUNTER — Inpatient Hospital Stay (HOSPITAL_COMMUNITY): Payer: BC Managed Care – PPO | Admitting: Anesthesiology

## 2020-04-04 ENCOUNTER — Encounter (HOSPITAL_COMMUNITY)
Admission: AD | Disposition: A | Discharge status: Home / Self Care | Payer: Self-pay | Source: Home / Self Care | Attending: Obstetrics and Gynecology

## 2020-04-04 ENCOUNTER — Inpatient Hospital Stay (HOSPITAL_COMMUNITY): Payer: BC Managed Care – PPO

## 2020-04-04 LAB — CBC WITH DIFFERENTIAL/PLATELET
Abs Immature Granulocytes: 0.04 10*3/uL (ref 0.00–0.07)
Basophils Absolute: 0 10*3/uL (ref 0.0–0.1)
Basophils Relative: 1 %
Eosinophils Absolute: 0.1 10*3/uL (ref 0.0–0.5)
Eosinophils Relative: 2 %
HCT: 36.5 % (ref 36.0–46.0)
Hemoglobin: 12.4 g/dL (ref 12.0–15.0)
Immature Granulocytes: 1 %
Lymphocytes Relative: 27 %
Lymphs Abs: 1.8 10*3/uL (ref 0.7–4.0)
MCH: 30.5 pg (ref 26.0–34.0)
MCHC: 34 g/dL (ref 30.0–36.0)
MCV: 89.9 fL (ref 80.0–100.0)
Monocytes Absolute: 0.7 10*3/uL (ref 0.1–1.0)
Monocytes Relative: 10 %
Neutro Abs: 4.2 10*3/uL (ref 1.7–7.7)
Neutrophils Relative %: 59 %
Platelets: 140 10*3/uL — ABNORMAL LOW (ref 150–400)
RBC: 4.06 MIL/uL (ref 3.87–5.11)
RDW: 13.1 % (ref 11.5–15.5)
WBC: 6.9 10*3/uL (ref 4.0–10.5)
nRBC: 0 % (ref 0.0–0.2)

## 2020-04-04 LAB — COMPREHENSIVE METABOLIC PANEL
ALT: 25 U/L (ref 0–44)
AST: 27 U/L (ref 15–41)
Albumin: 2.2 g/dL — ABNORMAL LOW (ref 3.5–5.0)
Alkaline Phosphatase: 87 U/L (ref 38–126)
Anion gap: 8 (ref 5–15)
BUN: 8 mg/dL (ref 6–20)
CO2: 21 mmol/L — ABNORMAL LOW (ref 22–32)
Calcium: 8.5 mg/dL — ABNORMAL LOW (ref 8.9–10.3)
Chloride: 108 mmol/L (ref 98–111)
Creatinine, Ser: 0.64 mg/dL (ref 0.44–1.00)
Glucose, Bld: 101 mg/dL — ABNORMAL HIGH (ref 70–99)
Potassium: 3.8 mmol/L (ref 3.5–5.1)
Sodium: 137 mmol/L (ref 135–145)
Total Bilirubin: 0.9 mg/dL (ref 0.3–1.2)
Total Protein: 5.1 g/dL — ABNORMAL LOW (ref 6.5–8.1)

## 2020-04-04 LAB — CBC
HCT: 39.9 % (ref 36.0–46.0)
Hemoglobin: 13.4 g/dL (ref 12.0–15.0)
MCH: 30.2 pg (ref 26.0–34.0)
MCHC: 33.6 g/dL (ref 30.0–36.0)
MCV: 90.1 fL (ref 80.0–100.0)
Platelets: 148 10*3/uL — ABNORMAL LOW (ref 150–400)
RBC: 4.43 MIL/uL (ref 3.87–5.11)
RDW: 12.9 % (ref 11.5–15.5)
WBC: 6 10*3/uL (ref 4.0–10.5)
nRBC: 0 % (ref 0.0–0.2)

## 2020-04-04 LAB — GLUCOSE, CAPILLARY
Glucose-Capillary: 74 mg/dL (ref 70–99)
Glucose-Capillary: 109 mg/dL — ABNORMAL HIGH (ref 70–99)
Glucose-Capillary: 71 mg/dL (ref 70–99)
Glucose-Capillary: 79 mg/dL (ref 70–99)

## 2020-04-04 SURGERY — Surgical Case
Anesthesia: Spinal | Site: Abdomen | Wound class: Clean Contaminated

## 2020-04-04 MED ORDER — LACTATED RINGERS IV SOLN: INTRAVENOUS | Status: DC

## 2020-04-04 MED ORDER — ACETAMINOPHEN 10 MG/ML IV SOLN
INTRAVENOUS | Status: AC
  Filled 2020-04-04: qty 100

## 2020-04-04 MED ORDER — DEXTROSE 5 % IV SOLN
INTRAVENOUS | Status: AC
  Filled 2020-04-04: qty 3000

## 2020-04-04 MED ORDER — MORPHINE SULFATE (PF) 0.5 MG/ML IJ SOLN
INTRAMUSCULAR | Status: AC
  Filled 2020-04-04: qty 10

## 2020-04-04 MED ORDER — SIMETHICONE 80 MG PO CHEW: 80.0000 mg | CHEWABLE_TABLET | ORAL | Status: DC | PRN

## 2020-04-04 MED ORDER — PHENYLEPHRINE HCL-NACL 20-0.9 MG/250ML-% IV SOLN
INTRAVENOUS | Status: AC
  Filled 2020-04-04: qty 500

## 2020-04-04 MED ORDER — BUPIVACAINE IN DEXTROSE 0.75-8.25 % IT SOLN
INTRATHECAL | Status: DC | PRN
  Administered 2020-04-04: 1.6 mL via INTRATHECAL

## 2020-04-04 MED ORDER — OXYTOCIN-SODIUM CHLORIDE 30-0.9 UT/500ML-% IV SOLN
INTRAVENOUS | Status: AC
  Filled 2020-04-04: qty 500

## 2020-04-04 MED ORDER — ONDANSETRON HCL 4 MG/2ML IJ SOLN
4.0000 mg | Freq: Three times a day (TID) | INTRAMUSCULAR | Status: DC | PRN
  Administered 2020-04-04: 4 mg via INTRAVENOUS
  Filled 2020-04-04: qty 2

## 2020-04-04 MED ORDER — MENTHOL 3 MG MT LOZG: 1.0000 | LOZENGE | OROMUCOSAL | Status: DC | PRN

## 2020-04-04 MED ORDER — STERILE WATER FOR IRRIGATION IR SOLN
Status: DC | PRN
  Administered 2020-04-04: 1000 mL

## 2020-04-04 MED ORDER — OXYTOCIN-SODIUM CHLORIDE 30-0.9 UT/500ML-% IV SOLN
INTRAVENOUS | Status: DC | PRN
Start: 2020-04-04 — End: 2020-04-04
  Administered 2020-04-04: 30 [IU] via INTRAVENOUS

## 2020-04-04 MED ORDER — MORPHINE SULFATE (PF) 0.5 MG/ML IJ SOLN
INTRAMUSCULAR | Status: DC | PRN
  Administered 2020-04-04: .15 mg via INTRATHECAL

## 2020-04-04 MED ORDER — NALBUPHINE HCL 10 MG/ML IJ SOLN: 5.0000 mg | INTRAMUSCULAR | Status: DC | PRN

## 2020-04-04 MED ORDER — OXYCODONE HCL 5 MG PO TABS
5.0000 mg | ORAL_TABLET | ORAL | Status: DC | PRN
  Administered 2020-04-05 – 2020-04-07 (×7): 5 mg via ORAL
  Filled 2020-04-04 (×7): qty 1

## 2020-04-04 MED ORDER — SIMETHICONE 80 MG PO CHEW
80.0000 mg | CHEWABLE_TABLET | Freq: Three times a day (TID) | ORAL | Status: DC
  Administered 2020-04-05 – 2020-04-07 (×7): 80 mg via ORAL
  Filled 2020-04-04 (×7): qty 1

## 2020-04-04 MED ORDER — MEPERIDINE HCL 25 MG/ML IJ SOLN: 6.2500 mg | INTRAMUSCULAR | Status: DC | PRN

## 2020-04-04 MED ORDER — NALOXONE HCL 4 MG/10ML IJ SOLN
1.0000 ug/kg/h | INTRAVENOUS | Status: DC | PRN
  Filled 2020-04-04: qty 5

## 2020-04-04 MED ORDER — ACETAMINOPHEN 325 MG PO TABS
650.0000 mg | ORAL_TABLET | ORAL | Status: DC | PRN
  Administered 2020-04-06 – 2020-04-07 (×3): 650 mg via ORAL
  Filled 2020-04-04 (×3): qty 2

## 2020-04-04 MED ORDER — ONDANSETRON HCL 4 MG/2ML IJ SOLN
INTRAMUSCULAR | Status: AC
  Filled 2020-04-04: qty 2

## 2020-04-04 MED ORDER — FENTANYL CITRATE (PF) 100 MCG/2ML IJ SOLN
INTRAMUSCULAR | Status: AC
  Filled 2020-04-04: qty 2

## 2020-04-04 MED ORDER — KETOROLAC TROMETHAMINE 30 MG/ML IJ SOLN
30.0000 mg | Freq: Once | INTRAMUSCULAR | Status: AC | PRN
  Administered 2020-04-04: 30 mg via INTRAVENOUS

## 2020-04-04 MED ORDER — PROMETHAZINE HCL 25 MG/ML IJ SOLN: 6.2500 mg | INTRAMUSCULAR | Status: DC | PRN

## 2020-04-04 MED ORDER — KETOROLAC TROMETHAMINE 30 MG/ML IJ SOLN
30.0000 mg | Freq: Four times a day (QID) | INTRAMUSCULAR | Status: AC
  Administered 2020-04-05 (×3): 30 mg via INTRAVENOUS
  Filled 2020-04-04 (×3): qty 1

## 2020-04-04 MED ORDER — ONDANSETRON HCL 4 MG/2ML IJ SOLN
INTRAMUSCULAR | Status: DC | PRN
  Administered 2020-04-04: 4 mg via INTRAVENOUS

## 2020-04-04 MED ORDER — SODIUM CHLORIDE 0.9 % IV SOLN: INTRAVENOUS | Status: DC | PRN

## 2020-04-04 MED ORDER — KETOROLAC TROMETHAMINE 30 MG/ML IJ SOLN
INTRAMUSCULAR | Status: AC
  Filled 2020-04-04: qty 1

## 2020-04-04 MED ORDER — SIMETHICONE 80 MG PO CHEW
80.0000 mg | CHEWABLE_TABLET | ORAL | Status: DC
  Administered 2020-04-05 – 2020-04-07 (×3): 80 mg via ORAL
  Filled 2020-04-04 (×3): qty 1

## 2020-04-04 MED ORDER — CEFAZOLIN (ANCEF) 1 G IV SOLR
3.0000 g | INTRAVENOUS | Status: DC
  Administered 2020-04-04: 3 g

## 2020-04-04 MED ORDER — NALBUPHINE HCL 10 MG/ML IJ SOLN: 5.0000 mg | Freq: Once | INTRAMUSCULAR | Status: DC | PRN

## 2020-04-04 MED ORDER — HYDROMORPHONE HCL 1 MG/ML IJ SOLN: 0.2500 mg | INTRAMUSCULAR | Status: DC | PRN

## 2020-04-04 MED ORDER — DIPHENHYDRAMINE HCL 50 MG/ML IJ SOLN: 12.5000 mg | INTRAMUSCULAR | Status: DC | PRN

## 2020-04-04 MED ORDER — PRENATAL MULTIVITAMIN CH
1.0000 | ORAL_TABLET | Freq: Every day | ORAL | Status: DC
  Administered 2020-04-05 – 2020-04-07 (×3): 1 via ORAL
  Filled 2020-04-04 (×3): qty 1

## 2020-04-04 MED ORDER — WITCH HAZEL-GLYCERIN EX PADS: 1.0000 "application " | MEDICATED_PAD | CUTANEOUS | Status: DC | PRN

## 2020-04-04 MED ORDER — SODIUM CHLORIDE 0.9 % IR SOLN
Status: DC | PRN
  Administered 2020-04-04: 1000 mL

## 2020-04-04 MED ORDER — FENTANYL CITRATE (PF) 100 MCG/2ML IJ SOLN
INTRAMUSCULAR | Status: DC | PRN
  Administered 2020-04-04: 15 ug via INTRATHECAL

## 2020-04-04 MED ORDER — DEXTROSE 5 % IV SOLN: 3.0000 g | Freq: Once | INTRAVENOUS | Status: DC

## 2020-04-04 MED ORDER — ACETAMINOPHEN 10 MG/ML IV SOLN
1000.0000 mg | Freq: Once | INTRAVENOUS | Status: AC
  Administered 2020-04-04: 1000 mg via INTRAVENOUS

## 2020-04-04 MED ORDER — ZOLPIDEM TARTRATE 5 MG PO TABS: 5.0000 mg | ORAL_TABLET | Freq: Every evening | ORAL | Status: DC | PRN

## 2020-04-04 MED ORDER — IBUPROFEN 600 MG PO TABS
600.0000 mg | ORAL_TABLET | Freq: Four times a day (QID) | ORAL | Status: DC
  Administered 2020-04-05 – 2020-04-07 (×8): 600 mg via ORAL
  Filled 2020-04-04 (×8): qty 1

## 2020-04-04 MED ORDER — DIPHENHYDRAMINE HCL 25 MG PO CAPS
25.0000 mg | ORAL_CAPSULE | Freq: Four times a day (QID) | ORAL | Status: DC | PRN
  Administered 2020-04-05: 25 mg via ORAL
  Filled 2020-04-04: qty 1

## 2020-04-04 MED ORDER — SCOPOLAMINE 1 MG/3DAYS TD PT72: 1.0000 | MEDICATED_PATCH | Freq: Once | TRANSDERMAL | Status: DC

## 2020-04-04 MED ORDER — COCONUT OIL OIL
1.0000 "application " | TOPICAL_OIL | Status: DC | PRN
  Administered 2020-04-05: 1 via TOPICAL

## 2020-04-04 MED ORDER — SENNOSIDES-DOCUSATE SODIUM 8.6-50 MG PO TABS
2.0000 | ORAL_TABLET | ORAL | Status: DC
  Administered 2020-04-05 – 2020-04-07 (×3): 2 via ORAL
  Filled 2020-04-04 (×3): qty 2

## 2020-04-04 MED ORDER — SODIUM CHLORIDE 0.9% FLUSH: 3.0000 mL | INTRAVENOUS | Status: DC | PRN

## 2020-04-04 MED ORDER — PHENYLEPHRINE HCL-NACL 20-0.9 MG/250ML-% IV SOLN
INTRAVENOUS | Status: DC | PRN
  Administered 2020-04-04: 60 ug/min via INTRAVENOUS

## 2020-04-04 MED ORDER — DIBUCAINE (PERIANAL) 1 % EX OINT: 1.0000 "application " | TOPICAL_OINTMENT | CUTANEOUS | Status: DC | PRN

## 2020-04-04 MED ORDER — NALOXONE HCL 0.4 MG/ML IJ SOLN: 0.4000 mg | INTRAMUSCULAR | Status: DC | PRN

## 2020-04-04 MED ORDER — OXYTOCIN-SODIUM CHLORIDE 30-0.9 UT/500ML-% IV SOLN: 2.5000 [IU]/h | INTRAVENOUS | Status: AC

## 2020-04-04 MED ORDER — DIPHENHYDRAMINE HCL 25 MG PO CAPS: 25.0000 mg | ORAL_CAPSULE | ORAL | Status: DC | PRN

## 2020-04-04 MED ORDER — ENOXAPARIN SODIUM 60 MG/0.6ML ~~LOC~~ SOLN
60.0000 mg | SUBCUTANEOUS | Status: DC
  Administered 2020-04-05 – 2020-04-06 (×2): 60 mg via SUBCUTANEOUS
  Filled 2020-04-04 (×2): qty 0.6

## 2020-04-04 SURGICAL SUPPLY — 44 items
ADH SKN CLS APL DERMABOND .7 (GAUZE/BANDAGES/DRESSINGS)
APL SKNCLS STERI-STRIP NONHPOA (GAUZE/BANDAGES/DRESSINGS) ×1
BARRIER ADHS 3X4 INTERCEED (GAUZE/BANDAGES/DRESSINGS) ×3 IMPLANT
BENZOIN TINCTURE PRP APPL 2/3 (GAUZE/BANDAGES/DRESSINGS) ×3 IMPLANT
BRR ADH 4X3 ABS CNTRL BYND (GAUZE/BANDAGES/DRESSINGS) ×1
CHLORAPREP W/TINT 26ML (MISCELLANEOUS) ×3 IMPLANT
CLAMP CORD UMBIL (MISCELLANEOUS) IMPLANT
CLOSURE STERI STRIP 1/2 X4 (GAUZE/BANDAGES/DRESSINGS) ×2 IMPLANT
CLOSURE WOUND 1/2 X4 (GAUZE/BANDAGES/DRESSINGS) ×1
CLOTH BEACON ORANGE TIMEOUT ST (SAFETY) ×3 IMPLANT
DERMABOND ADVANCED (GAUZE/BANDAGES/DRESSINGS)
DERMABOND ADVANCED .7 DNX12 (GAUZE/BANDAGES/DRESSINGS) IMPLANT
DRESSING PREVENA PLUS CUSTOM (GAUZE/BANDAGES/DRESSINGS) ×1 IMPLANT
DRSG OPSITE POSTOP 4X10 (GAUZE/BANDAGES/DRESSINGS) ×3 IMPLANT
DRSG PREVENA PLUS CUSTOM (GAUZE/BANDAGES/DRESSINGS) ×3
ELECT REM PT RETURN 9FT ADLT (ELECTROSURGICAL) ×3
ELECTRODE REM PT RTRN 9FT ADLT (ELECTROSURGICAL) ×1 IMPLANT
EXTRACTOR VACUUM KIWI (MISCELLANEOUS) ×3 IMPLANT
GLOVE BIOGEL PI IND STRL 7.0 (GLOVE) ×3 IMPLANT
GLOVE BIOGEL PI INDICATOR 7.0 (GLOVE) ×6
GLOVE ECLIPSE 6.5 STRL STRAW (GLOVE) ×3 IMPLANT
GOWN STRL REUS W/TWL LRG LVL3 (GOWN DISPOSABLE) ×9 IMPLANT
KIT ABG SYR 3ML LUER SLIP (SYRINGE) IMPLANT
NEEDLE HYPO 25X5/8 SAFETYGLIDE (NEEDLE) IMPLANT
NS IRRIG 1000ML POUR BTL (IV SOLUTION) ×3 IMPLANT
PACK C SECTION WH (CUSTOM PROCEDURE TRAY) ×3 IMPLANT
PAD ABD 7.5X8 STRL (GAUZE/BANDAGES/DRESSINGS) ×3 IMPLANT
PAD OB MATERNITY 4.3X12.25 (PERSONAL CARE ITEMS) ×3 IMPLANT
PENCIL SMOKE EVAC W/HOLSTER (ELECTROSURGICAL) ×3 IMPLANT
RTRCTR C-SECT PINK 25CM LRG (MISCELLANEOUS) ×3 IMPLANT
SPONGE LAP 18X18 X RAY DECT (DISPOSABLE) ×3 IMPLANT
STRIP CLOSURE SKIN 1/2X4 (GAUZE/BANDAGES/DRESSINGS) ×2 IMPLANT
SUT PLAIN 0 NONE (SUTURE) IMPLANT
SUT PLAIN 2 0 XLH (SUTURE) ×3 IMPLANT
SUT VIC AB 0 CT1 27 (SUTURE) ×6
SUT VIC AB 0 CT1 27XBRD ANBCTR (SUTURE) ×2 IMPLANT
SUT VIC AB 0 CTX 36 (SUTURE) ×9
SUT VIC AB 0 CTX36XBRD ANBCTRL (SUTURE) ×3 IMPLANT
SUT VIC AB 2-0 CT1 27 (SUTURE) ×3
SUT VIC AB 2-0 CT1 TAPERPNT 27 (SUTURE) ×1 IMPLANT
SUT VIC AB 4-0 KS 27 (SUTURE) ×3 IMPLANT
TOWEL OR 17X24 6PK STRL BLUE (TOWEL DISPOSABLE) ×3 IMPLANT
TRAY FOLEY W/BAG SLVR 14FR LF (SET/KITS/TRAYS/PACK) IMPLANT
WATER STERILE IRR 1000ML POUR (IV SOLUTION) ×3 IMPLANT

## 2020-04-04 NOTE — Progress Notes (Signed)
OB- Ante PN  S: Patient resting comfortably- notes some neck pain, but denies headache, blurry vision, no RUQ pain.  Feeling good fetal movement.  No contractions/VB/LOF.  O: BP 123/66 (BP Location: Right Arm)   Pulse 79   Temp 98.4 F (36.9 C) (Oral)   Resp 18   LMP 07/12/2019   SpO2 96%   Gen: NAD Lungs: normal respiratory rate and effort Abd: obese, gravid Ext: no edema, SCDs in place  Labs:  Results for orders placed or performed during the hospital encounter of 04/03/20 (from the past 24 hour(s))  Urinalysis, Routine w reflex microscopic     Status: Abnormal   Collection Time: 04/03/20 11:30 AM  Result Value Ref Range   Color, Urine YELLOW YELLOW   APPearance HAZY (A) CLEAR   Specific Gravity, Urine 1.006 1.005 - 1.030   pH 6.0 5.0 - 8.0   Glucose, UA NEGATIVE NEGATIVE mg/dL   Hgb urine dipstick NEGATIVE NEGATIVE   Bilirubin Urine NEGATIVE NEGATIVE   Ketones, ur NEGATIVE NEGATIVE mg/dL   Protein, ur NEGATIVE NEGATIVE mg/dL   Nitrite NEGATIVE NEGATIVE   Leukocytes,Ua NEGATIVE NEGATIVE  Protein / creatinine ratio, urine     Status: Abnormal   Collection Time: 04/03/20 11:32 AM  Result Value Ref Range   Creatinine, Urine 45.54 mg/dL   Total Protein, Urine 22 mg/dL   Protein Creatinine Ratio 0.48 (H) 0.00 - 0.15 mg/mg[Cre]  CBC     Status: None   Collection Time: 04/03/20 12:25 PM  Result Value Ref Range   WBC 8.0 4.0 - 10.5 K/uL   RBC 4.25 3.87 - 5.11 MIL/uL   Hemoglobin 12.8 12.0 - 15.0 g/dL   HCT 93.7 36 - 46 %   MCV 88.0 80.0 - 100.0 fL   MCH 30.1 26.0 - 34.0 pg   MCHC 34.2 30.0 - 36.0 g/dL   RDW 90.2 40.9 - 73.5 %   Platelets 151 150 - 400 K/uL   nRBC 0.0 0.0 - 0.2 %  Comprehensive metabolic panel     Status: Abnormal   Collection Time: 04/03/20 12:25 PM  Result Value Ref Range   Sodium 138 135 - 145 mmol/L   Potassium 4.0 3.5 - 5.1 mmol/L   Chloride 106 98 - 111 mmol/L   CO2 21 (L) 22 - 32 mmol/L   Glucose, Bld 99 70 - 99 mg/dL   BUN 9 6 - 20 mg/dL    Creatinine, Ser 3.29 0.44 - 1.00 mg/dL   Calcium 9.3 8.9 - 92.4 mg/dL   Total Protein 5.7 (L) 6.5 - 8.1 g/dL   Albumin 2.5 (L) 3.5 - 5.0 g/dL   AST 26 15 - 41 U/L   ALT 23 0 - 44 U/L   Alkaline Phosphatase 94 38 - 126 U/L   Total Bilirubin 0.8 0.3 - 1.2 mg/dL   GFR calc non Af Amer >60 >60 mL/min   GFR calc Af Amer >60 >60 mL/min   Anion gap 11 5 - 15  Type and screen [facility]     Status: None   Collection Time: 04/03/20 12:25 PM  Result Value Ref Range   ABO/RH(D) A NEG    Antibody Screen NEG    Sample Expiration      04/06/2020,2359 Performed at [facility], [address] [place], [place] [address]   SARS Coronavirus 2 by RT PCR (hospital order, performed in [facility] hospital lab) Nasopharyngeal Nasopharyngeal Swab     Status: None  Collection Time: 04/03/20  1:07 PM   Specimen: Nasopharyngeal Swab  Result Value Ref Range   SARS Coronavirus 2 NEGATIVE NEGATIVE  Glucose, capillary     Status: None   Collection Time: 04/03/20  2:32 PM  Result Value Ref Range   Glucose-Capillary 84 70 - 99 mg/dL  Glucose, capillary     Status: Abnormal   Collection Time: 04/03/20  4:48 PM  Result Value Ref Range   Glucose-Capillary 118 (H) 70 - 99 mg/dL  Glucose, capillary     Status: Abnormal   Collection Time: 04/03/20  9:39 PM  Result Value Ref Range   Glucose-Capillary 107 (H) 70 - 99 mg/dL  Glucose, capillary     Status: None   Collection Time: 04/04/20  6:30 AM  Result Value Ref Range   Glucose-Capillary 74 70 - 99 mg/dL   A/P: 31DV G1P0@[redacted]w[redacted]d  admitted due to preeclampsia without severe features -Preeclampsia Labs as above, currently asymptomatic BP stable FWB reassuring  -GDMA2 -on metformin 1000mg  daily -accuchecks as above- currently within normal limits  Pt has been followed by MFM- last [id number] (03/29/20) - EFW: 9#1oz (>99%)- findings suggestive of LGA.  Management options reviewed with patient- plan for primary C-section.  Risk benefits and  alternatives of cesarean section were discussed with the patient including but not limited to infection, bleeding, damage to bowel , bladder and baby with the need for further surgery. Pt voiced understanding and desires to proceed. Inform consent obtained this am- plan to proceed later today.    [place], DO [phone] (cell) [phone] (office)  ]

## 2020-04-04 NOTE — Op Note (Signed)
PreOp Diagnosis: 1) Intrauterine pregnancy @ 106w1d 2) Suspected LGA 3) Preeclampsia without severe features 4) Morbid obesity PostOp Diagnosis: same Procedure: Primary C-section Surgeon: Dr. Myna Hidalgo Assistant: Dr. Steva Ready Anesthesia: spinal Complications: none EBL: 2300cc UOP: 50cc Fluids: 530cc  Findings: Female infant from vertex presentation, normal uterus, tubes and ovaries bilaterally  PROCEDURE:  Informed consent was obtained from the patient with risks, benefits, complications, treatment options, and expected outcomes discussed with the patient.  The patient concurred with the proposed plan, giving informed consent with form signed.   The patient was taken to Operating Room, and identified with the procedure verified as C-Section Delivery with Time Out. With induction of anesthesia, the patient was prepped and draped in the usual sterile fashion. A Pfannenstiel incision was made and carried down through the subcutaneous tissue to the fascia. The fascia was incised in the midline and extended transversely. The superior aspect of the fascial incision was grasped with Kochers elevated and the underlying muscle dissected off. The inferior aspect of the facial incision was in similar fashion, grasped elevated and rectus muscles dissected off. The peritoneum was identified and entered. Peritoneal incision was extended longitudinally. The utero-vesical peritoneal reflection was identified and incised transversely with the [facility] scissors, the incision extended laterally, the bladder flap created digitally. A low transverse uterine incision was made and the infants head delivered atraumatically with vacuum assistance. After the umbilical cord was clamped and cut cord blood was obtained for evaluation.   The placenta was removed intact and appeared normal. The uterine outline, tubes and ovaries appeared normal. The uterine incision was closed with running locked sutures of 0 Vicryl and a  second layer of the same stitch was used in an imbricating fashion.  Excellent hemostasis was obtained.  The pericolic gutters were then cleared of all clots and debris. Interceed was placed over the hysterotomy.  Peritoneum was closed in a running fashion. The fascia was then reapproximated with running sutures of 0 Vicryl. The subcutaneous tissue was reapproximated with 2-0 plain gut suture.  The skin was closed with 4-0 vicryl in a subcuticular fashion.  Instrument, sponge, and needle counts were correct prior the abdominal closure and at the conclusion of the case. The patient was taken to recovery in stable condition.  Myna Hidalgo, DO [phone] (cell) [phone] (office)

## 2020-04-04 NOTE — Anesthesia Preprocedure Evaluation (Addendum)
Anesthesia Evaluation  Patient identified by MRN, date of birth, ID band Patient awake    Reviewed: Allergy & Precautions, NPO status , Patient's Chart, lab work & pertinent test results  Airway Mallampati: II  TM Distance: >3 FB Neck ROM: Full    Dental no notable dental hx. (+) Dental Advisory Given, Teeth Intact   Pulmonary asthma ,    Pulmonary exam normal breath sounds clear to auscultation       Cardiovascular hypertension, Normal cardiovascular exam Rhythm:Regular Rate:Normal     Neuro/Psych  Headaches, negative psych ROS   GI/Hepatic negative GI ROS, Neg liver ROS,   Endo/Other  diabetesMorbid obesity  Renal/GU negative Renal ROS     Musculoskeletal negative musculoskeletal ROS (+)   Abdominal (+) + obese,   Peds  Hematology negative hematology ROS (+)   Anesthesia Other Findings   Reproductive/Obstetrics (+) Pregnancy                            Anesthesia Physical Anesthesia Plan  ASA: III  Anesthesia Plan: Spinal   Post-op Pain Management:    Induction: Intravenous  PONV Risk Score and Plan: 3 and Ondansetron, Dexamethasone, Scopolamine patch - Pre-op and Treatment may vary due to age or medical condition  Airway Management Planned: Natural Airway  Additional Equipment:   Intra-op Plan:   Post-operative Plan:   Informed Consent: I have reviewed the patients History and Physical, chart, labs and discussed the procedure including the risks, benefits and alternatives for the proposed anesthesia with the patient or authorized representative who has indicated his/her understanding and acceptance.     Dental advisory given  Plan Discussed with: CRNA  Anesthesia Plan Comments:        Anesthesia Quick Evaluation

## 2020-04-04 NOTE — Lactation Note (Signed)
This note was copied from a baby's chart. Lactation Consultation Note  Patient Name: Catherine Christian Date: 04/04/2020  [name] Catherine Christian now 6 hours old born via csection at 38 weeks and 1 day gestation. Mom obese with GDM.   Parents took breastfeeding class.  Mom has concerns that her nipples do not evert well.  Discussed concerns.  Mom STS with Catherine Christian on arrival.  Mom reports they recently tried to feed him and he would not latch.  Mom reports most breastfeedings have just been attempts.   Assisted with hand expression.  Mom has large breasts with flat/short shaft nipples and areolar tissue not very compressible.  Mom with visible areolar edema. Gave mom shells to wear for inverted nipples. Able to get glistenings with hand expression but nothing to collect.  Mom has hand pump to evert nipples.  Used hand pump and able to collect a few drops of colostrum.  Spoon fed them to Catherine Christian and he would still not rouse.  Urged parents to feed on cue and 8 or more times a day.  Urged them to give him drops of colostrum past breastfeedings and/or when he would not wake to feed.  Parents agreed.  Urged STS to help with blood sugars as well.  Urged parents to call lactation as needed.   Maternal Data    Feeding Feeding Type: Breast Fed  LATCH Score Latch: Too sleepy or reluctant, no latch achieved, no sucking elicited.  Audible Swallowing: None  Type of Nipple: Flat  Comfort (Breast/Nipple): Soft / non-tender  Hold (Positioning): Full assist, staff holds infant at breast  LATCH Score: 3  Interventions Interventions: Assisted with latch;Skin to skin;Pre-pump if needed  Lactation Tools Discussed/Used     Consult Status      Catherine Christian 04/04/2020, 11:11 PM

## 2020-04-04 NOTE — Anesthesia Procedure Notes (Signed)
Spinal  Patient location during procedure: OR Staffing Anesthesiologist: Author Hatlestad, MD Preanesthetic Checklist Completed: patient identified, IV checked, site marked, risks and benefits discussed, surgical consent, monitors and equipment checked, pre-op evaluation and timeout performed Spinal Block Patient position: sitting Prep: DuraPrep and site prepped and draped Patient monitoring: heart rate, cardiac monitor, continuous pulse ox and blood pressure Approach: midline Location: L3-4 Injection technique: single-shot Needle Needle type: Sprotte  Needle gauge: 24 G Needle length: 9 cm Assessment Sensory level: T4     

## 2020-04-04 NOTE — Transfer of Care (Signed)
Immediate Anesthesia Transfer of Care Note  Patient: Catherine Christian  Procedure(s) Performed: CESAREAN SECTION (N/A Abdomen)  Patient Location: PACU  Anesthesia Type:Spinal  Level of Consciousness: awake  Airway & Oxygen Therapy: Patient Spontanous Breathing  Post-op Assessment: Report given to RN  Post vital signs: Reviewed and stable  Last Vitals:  Vitals Value Taken Time  BP 118/69 04/04/20 1751  Temp    Pulse 66 04/04/20 1754  Resp 19 04/04/20 1754  SpO2 99 % 04/04/20 1754  Vitals shown include unvalidated device data.  Last Pain:  Vitals:   04/04/20 1504  TempSrc: Oral  PainSc:          Complications: No complications documented.

## 2020-04-05 LAB — GLUCOSE, CAPILLARY
Glucose-Capillary: 68 mg/dL — ABNORMAL LOW (ref 70–99)
Glucose-Capillary: 80 mg/dL (ref 70–99)
Glucose-Capillary: 101 mg/dL — ABNORMAL HIGH (ref 70–99)
Glucose-Capillary: 89 mg/dL (ref 70–99)

## 2020-04-05 LAB — CBC
HCT: 33.1 % — ABNORMAL LOW (ref 36.0–46.0)
Hemoglobin: 11.1 g/dL — ABNORMAL LOW (ref 12.0–15.0)
MCH: 30 pg (ref 26.0–34.0)
MCHC: 33.5 g/dL (ref 30.0–36.0)
MCV: 89.5 fL (ref 80.0–100.0)
Platelets: 144 10*3/uL — ABNORMAL LOW (ref 150–400)
RBC: 3.7 MIL/uL — ABNORMAL LOW (ref 3.87–5.11)
RDW: 12.8 % (ref 11.5–15.5)
WBC: 10 10*3/uL (ref 4.0–10.5)
nRBC: 0 % (ref 0.0–0.2)

## 2020-04-05 LAB — CREATININE, SERUM
Creatinine, Ser: 0.82 mg/dL (ref 0.44–1.00)
GFR calc Af Amer: >60 mL/min (ref >60)
GFR calc non Af Amer: >60 mL/min (ref >60)

## 2020-04-05 LAB — RPR: RPR Ser Ql: NONREACTIVE

## 2020-04-05 MED ORDER — RHO D IMMUNE GLOBULIN 1500 UNIT/2ML IJ SOSY
300.0000 ug | PREFILLED_SYRINGE | Freq: Once | INTRAMUSCULAR | Status: AC
  Administered 2020-04-05: 300 ug via INTRAVENOUS
  Filled 2020-04-05: qty 2

## 2020-04-05 NOTE — Lactation Note (Signed)
This note was copied from a baby's chart. Lactation Consultation Note  Patient Name: Catherine Christian HALPF'X Date: 04/05/2020 Reason for consult: Follow-up assessment;Difficult latch;Primapara;1st time breastfeeding;Early term 37-38.6wks;Maternal endocrine disorder Type of Endocrine Disorder?: Diabetes   Follow up with 61 hours old [name] of a P1 mother eager to breastfeed. Father was holding baby upon arrival. Mother was getting ready to feed baby. Assisted with position and support pillows. Mother has large, non compressible with flat nipples. Nipples can get everted for a very short period with manual pump. Attempted latch on football hold to left breast. Baby seemed uninterested and fell asleep. Able to wake up baby and attempted latch with a 20 mm nipple shield. Baby latched a few time but unable to sustain latch. Requested some donor milk with parents approval and used it inside nipple shield to entice baby to latch. Baby latched successfully and got ~61mL. Attempted a second time but baby fell asleep and unable to feed.   Talked to parents about paced bottle feeding with DM since baby seemed tired and uninterested. Set up for FOB and baby had 21mL of DM. Offered to show paced bottle feeding and baby had ~48mL of DM. Encouraged parents to continue offering the breast first and then supplementing with DM every time baby shows hunger cues. Discussed unswaddling and changing diaper at least 3 hours to wake up baby for a feeding since baby is an ETI.    Discussed with mother to continue pumping every 2-3 hours for stimulation purposes since DM is being used to feed baby. Reviewed information about frequency, cleaning and milk storage. Encouraged to feed baby everything she pumps. Mother verbalized agreement.  Encourage to follow hunger cues to feed baby and fullness signs when supplementing to prevent overfeeding. Reviewed breastfeeding basics. Discussed milk coming to volume. Reviewed NBN  behavior and second day expectations with parents and encouraged to contact [facility] for support as needed and recommended to request help for questions or concerns.    All questions answered at this time.   Feeding Feeding Type: Donor Breast Milk  LATCH Score Latch: Repeated attempts needed to sustain latch, nipple held in mouth throughout feeding, stimulation needed to elicit sucking reflex.  Audible Swallowing: A few with stimulation  Type of Nipple: Flat  Comfort (Breast/Nipple): Soft / non-tender  Hold (Positioning): Assistance needed to correctly position infant at breast and maintain latch.  LATCH Score: 6  Interventions Interventions: Breast feeding basics reviewed;Assisted with latch;Skin to skin;Breast massage;Hand express;Adjust position;Support pillows;Position options;Hand pump;DEBP  Lactation Tools Discussed/Used Tools: Pump;Nipple Dorris Carnes;Bottle Nipple shield size: 20;24 Breast pump type: Double-Electric Breast Pump;Manual   Consult Status Consult Status: Follow-up Date: 04/06/20 Follow-up type: In-patient    Blakley Michna A Higuera Ancidey 04/05/2020, 7:38 PM

## 2020-04-05 NOTE — Progress Notes (Signed)
Postop Note Day # 1  S:  Patient resting comfortable in bed.  Pain controlled.  Pt had vomiting x2 overnight due to nausea, tolerating clears.  Did have small diet this am. No flatus, no BM.  Lochia minimal.  Minimal ambulation.  No F/C SOB, or CP.  Pt plans on breastfeeding  O: Temp:  [97.4 F (36.3 C)-98.6 F (37 C)] 98.5 F (36.9 C) (07/20 2205) Pulse Rate:  [62-99] 69 (07/21 0605) Resp:  [9-22] 20 (07/21 0605) BP: (101-135)/(48-85) 116/71 (07/21 0605) SpO2:  [89 %-99 %] 99 % (07/21 0605) Weight:  [118.8 kg] 118.8 kg (07/20 1618)   Gen: A&Ox3, NAD CV: RRR Resp: CTAB Abdomen: soft, NT, ND, +BS Uterus: firm, non-tender, below umbilicus Incision: c/d/i, bandage on Ext: No edema, no calf tenderness bilaterally, SCDs in place  Labs:  Recent Labs    04/04/20 1530 04/05/20 0612  HGB 13.4 11.1*    A/P: Pt is a 29 y.o. G1P1001 s/p primary C-section, POD#1 - Preeclampsia  BP well controlled, currently asymptomatic -GDMA2  Plan to monitor fasting and 2hr postprandial - Pain well controlled -GU: foley in place, plan to remove foley later today -GI: Advance diet as tolerated -Activity: encouraged sitting up to chair and ambulation as tolerated -Prophylaxis: SCDs in place, Lovenox daily -Labs: stable as above  Myna Hidalgo, DO [phone] (cell) [phone] (office)

## 2020-04-06 LAB — RH IG WORKUP (INCLUDES ABO/RH)
ABO/RH(D): A NEG
Fetal Screen: NEGATIVE
Gestational Age(Wks): 38
Unit division: 0

## 2020-04-06 LAB — SURGICAL PATHOLOGY

## 2020-04-06 LAB — GLUCOSE, CAPILLARY: Glucose-Capillary: 105 mg/dL — ABNORMAL HIGH (ref 70–99)

## 2020-04-06 MED ORDER — NIFEDIPINE ER OSMOTIC RELEASE 30 MG PO TB24
30.0000 mg | ORAL_TABLET | Freq: Every day | ORAL | Status: DC
  Administered 2020-04-06: 30 mg via ORAL
  Filled 2020-04-06: qty 1

## 2020-04-06 NOTE — Progress Notes (Signed)
Postop Note Day # 2  S:  Patient resting comfortable in bed.  Pain controlled. Tolerating gen diet. + flatus, + BM.  Lochia minimal.  + ambulation.  No F/C/N/V SOB, or CP.  Pt plans on breastfeeding- though having difficulty- being seen by lactation  O: Temp:  [98.1 F (36.7 C)-98.7 F (37.1 C)] 98.4 F (36.9 C) (07/22 2355) Pulse Rate:  [72-83] 77 (07/22 0613) Resp:  [16-18] 18 (07/22 7322) BP: (107-133)/(51-90) 133/90 (07/22 0254) SpO2:  [97 %-99 %] 99 % (07/22 2706)   Gen: A&Ox3, NAD CV: RRR Resp: CTAB Abdomen: soft, NT, ND, +BS Uterus: firm, non-tender, below umbilicus Incision: c/d/i, bandage on Ext: No edema, no calf tenderness bilaterally, SCDs in place  Labs:  Results for orders placed or performed during the hospital encounter of 04/03/20 (from the past 24 hour(s))  Glucose, capillary     Status: Abnormal   Collection Time: 04/05/20  8:24 AM  Result Value Ref Range   Glucose-Capillary 68 (L) 70 - 99 mg/dL  Rh IG workup (includes ABO/Rh)     Status: None   Collection Time: 04/05/20  8:30 AM  Result Value Ref Range   Gestational Age(Wks) 38    ABO/RH(D) A NEG    Fetal Screen NEG    Unit Number [id number]    Blood Component Type RHIG    Unit division 00    Status of Unit ISSUED,FINAL    Transfusion Status      OK TO TRANSFUSE Performed at [facility], [address], [place], [place] [address]   RPR     Status: None   Collection Time: 04/05/20  8:30 AM  Result Value Ref Range   RPR Ser Ql NON REACTIVE NON REACTIVE  Glucose, capillary     Status: None   Collection Time: 04/05/20  8:47 AM  Result Value Ref Range   Glucose-Capillary 80 70 - 99 mg/dL  Glucose, capillary     Status: Abnormal   Collection Time: 04/05/20  2:20 PM  Result Value Ref Range   Glucose-Capillary 101 (H) 70 - 99 mg/dL  Glucose, capillary     Status: None   Collection Time: 04/05/20  7:49 PM  Result Value Ref Range   Glucose-Capillary 89 70 - 99 mg/dL    A/P: Pt is a 29  y.o. G1P1001 s/p primary C-section, POD#2 - Preeclampsia  BP within normal limits- plan to continue to closely monitor -GDMA2  Within normal limits - Pain well controlled -GU: voiding freely -GI: tolerating general diet -Activity: encouraged sitting up to chair and ambulation as tolerated -Prophylaxis: SCDs in place, Lovenox daily -Labs: stable as above  Myna Hidalgo, DO [phone] (cell) [phone] (office)

## 2020-04-06 NOTE — Progress Notes (Signed)
Dr Charlotta Newton informed of elevated BPs.  Pt has no c/o vision changes, epigastric pain or headache. New orders received. Will continue to monitor

## 2020-04-06 NOTE — Lactation Note (Signed)
This note was copied from a baby's chart. Lactation Consultation Note  Patient Name: Catherine Christian Date: 04/06/2020 Reason for consult: Follow-up assessment;Difficult latch;Primapara;1st time breastfeeding;Early term 37-38.6wks Type of Endocrine Disorder?: Diabetes  1854 - 1909 - I followed up with [name]. She and her support person were attempting to breast feed [name] Christian in football hold on the left breast upon entry. Baby appeared sleepy at the breast. They shared that he has had difficulty staying awake. He cluster fed overnight, but has been very sleepy today. They have been supplementing due to difficulty with keeping baby active at the breast. Catherine Christian states that she prefilled her size 20 nipple shield with her pumped colostrum. However, after she refilled it with donor milk he either held the shield in his mouth or refused the breast altogether.  We reiterated the plan given by their LC yesterday. They are offering the breast first, then Catherine Christian pumps while her support person supplements using her EBM first, then they use donor milk. They are aware that baby's intake needs will increase in day 3.   I offered reassurance that she is doing a great job. I suggested that she continue to prioritize her pumping to help with increasing her milk volume. We discussed when to expect  Her milk to transition.  Parents were very complimentary towards the education given by yesterday's LC, Catherine Christian.   Evening shift RN came in to introduce herself.   Plan: Breast feed on demand 8-12 times da day. Pre-fill NS with breast milk. Post pump 15 minutes and supplement by curved tip syringe and/or bottle (suing both). Pace bottle feeding encouraged.  Maternal Data Has patient been taught Hand Expression?: Yes Does the patient have breastfeeding experience prior to this delivery?: No  Feeding Feeding Type: Breast Fed  LATCH Score Latch: Repeated attempts needed to  sustain latch, nipple held in mouth throughout feeding, stimulation needed to elicit sucking reflex.  Audible Swallowing: None  Type of Nipple: Flat  Comfort (Breast/Nipple): Soft / non-tender  Hold (Positioning): No assistance needed to correctly position infant at breast.  LATCH Score: 6  Interventions Interventions: Breast feeding basics reviewed;DEBP  Lactation Tools Discussed/Used Tools: Nipple Shields (curved tip syringe) Nipple shield size: 20 Breast pump type: Double-Electric Breast Pump Pump Review: Setup, frequency, and cleaning   Consult Status Consult Status: Follow-up Date: 04/07/20 Follow-up type: In-patient    Catherine Christian 04/06/2020, 7:16 PM

## 2020-04-07 LAB — GLUCOSE, CAPILLARY: Glucose-Capillary: 82 mg/dL (ref 70–99)

## 2020-04-07 MED ORDER — NIFEDIPINE ER 60 MG PO TB24: 60.0000 mg | ORAL_TABLET | Freq: Every day | ORAL | 0 refills | Status: DC

## 2020-04-07 MED ORDER — DOCUSATE SODIUM 100 MG PO CAPS
100.0000 mg | ORAL_CAPSULE | Freq: Two times a day (BID) | ORAL | 0 refills | Status: DC
Start: 2020-04-07 — End: 2020-06-26

## 2020-04-07 MED ORDER — IBUPROFEN 600 MG PO TABS: 600.0000 mg | ORAL_TABLET | Freq: Four times a day (QID) | ORAL | 0 refills | Status: DC

## 2020-04-07 MED ORDER — OXYCODONE HCL 5 MG PO TABS: 5.0000 mg | ORAL_TABLET | Freq: Four times a day (QID) | ORAL | 0 refills | Status: AC | PRN

## 2020-04-07 MED ORDER — NIFEDIPINE ER OSMOTIC RELEASE 30 MG PO TB24
60.0000 mg | ORAL_TABLET | Freq: Every day | ORAL | Status: DC
  Administered 2020-04-07: 60 mg via ORAL
  Filled 2020-04-07: qty 2

## 2020-04-07 NOTE — Lactation Note (Signed)
This note was copied from a baby's chart. Lactation Consultation Note  Patient Name: Catherine Christian Date: 04/07/2020 Reason for consult: Follow-up assessment;Difficult latch;1st time breastfeeding;Early term 37-38.6wks;Infant weight loss;Other (Comment) (6 % weight loss , Bili check WNL) Type of Endocrine Disorder?: Diabetes  Baby is 50 hours old  As LC entered the room, dad feeding baby donor milk with a purple very slow flow nipple.  Dad mentioned felt the feeding was taking to long and requested a Yellow nipple.  LC will provide and mentioned when the they go home to increase to a Dr. Manson Passey newborn  To work on having the baby open wider so the baby will be able to latch better at the breast.  LC resized the NS and noted the #20 NS to be snug ( probably will work for today but once baby is opening wider and moms tissue becomes more compressible the #24 NS will be a better fit.  LC assessed breast tissue and noted the areola still have some edema and when compressed swelling noted. LC recommended to wear the shells between feedings except when sleeping or 10 mins prior to feeding.  LC stressed the importance of keeping her pre-pumping prior to NS and post pumping with the DEBP to stimulate the milk production.  Sore nipple and engorgement prevention and tx reviewed.  LC plan :  Breast shells between feedings except when sleeping  Prior to latching - breast massage, hand express, pre-pump to stretch the nipple / areola complex - try to latch without the NS 1st if baby isn't latching   apply the NS with EBM instilled in the top/ firm support.  Feed for 15 -20 mins, 30 mins max and supplement with 30 ml after feeding at the breast.  Post pump both breast for 15 -20 mins , save milk, and storage of breast milk reviewed.   Per mom has a DEBP Spetra at home.  LC placed a request for the [facility] clinic to call mom for LC O?P next Monday or Tuesday for feeding assessment. Mom aware she will  receive a call.    Maternal Data Has patient been taught Hand Expression?: Yes  Feeding Feeding Type:  (dad recently fed the baby donor milk)  LATCH Score                   Interventions Interventions: Breast feeding basics reviewed;Shells;Hand pump;DEBP  Lactation Tools Discussed/Used Tools: Shells;Pump;Flanges;Nipple Shields Nipple shield size: 20;24 (resized and the #24 NS fits better) Flange Size: 24;27 Shell Type: Inverted Breast pump type: Manual;Double-Electric Breast Pump WIC Program: No Pump Review: Milk Storage   Consult Status Consult Status: Follow-up (LC offered to request and Lc O/P appt and mom and dad receptive - LC placed request in the O/P basket) Follow-up type: Out-patient    Matilde Sprang Anquinette Pierro 04/07/2020, 8:39 AM

## 2020-04-07 NOTE — Discharge Instructions (Signed)
Cesarean Delivery, Care After This sheet gives you information about how to care for yourself after your procedure. Your health care provider may also give you more specific instructions. If you have problems or questions, contact your health care provider. What can I expect after the procedure? After the procedure, it is common to have:  A small amount of blood or clear fluid coming from the incision.  Some redness, swelling, and pain in your incision area.  Some abdominal pain and soreness.  Vaginal bleeding (lochia). Even though you did not have a vaginal delivery, you will still have vaginal bleeding and discharge.  Pelvic cramps.  Fatigue. You may have pain, swelling, and discomfort in the tissue between your vagina and your anus (perineum) if:  Your C-section was unplanned, and you were allowed to labor and push.  An incision was made in the area (episiotomy) or the tissue tore during attempted vaginal delivery. Follow these instructions at home: Incision care   Follow instructions from your health care provider about how to take care of your incision. Make sure you: ? Wash your hands with soap and water before you change your bandage (dressing). If soap and water are not available, use hand sanitizer. ? If you have a dressing, change it or remove it as told by your health care provider. ? Leave stitches (sutures), skin staples, skin glue, or adhesive strips in place. These skin closures may need to stay in place for 2 weeks or longer. If adhesive strip edges start to loosen and curl up, you may trim the loose edges. Do not remove adhesive strips completely unless your health care provider tells you to do that.  Check your incision area every day for signs of infection. Check for: ? More redness, swelling, or pain. ? More fluid or blood. ? Warmth. ? Pus or a bad smell.  Do not take baths, swim, or use a hot tub until your health care provider says it's okay. Ask your health  care provider if you can take showers.  When you cough or sneeze, hug a pillow. This helps with pain and decreases the chance of your incision opening up (dehiscing). Do this until your incision heals. Medicines  For pain management- alternate between ibuprofen and tylenol.  For moderate to severe pain- take oxycodone along with the tylenol.  Oxycodone may cause constipation, be sure to take stool softener while on this medication  If you were prescribed an antibiotic medicine, take it as told by your health care provider. Do not stop taking the antibiotic even if you start to feel better.  Do not drive or use heavy machinery while taking prescription pain medicine. Lifestyle  Do not drink alcohol. This is especially important if you are breastfeeding or taking pain medicine.  Do not use any products that contain nicotine or tobacco, such as cigarettes, e-cigarettes, and chewing tobacco. If you need help quitting, ask your health care provider. Eating and drinking  Drink at least 8 eight-ounce glasses of water every day unless told not to by your health care provider. If you breastfeed, you may need to drink even more water.  Eat high-fiber foods every day. These foods may help prevent or relieve constipation. High-fiber foods include: ? Whole grain cereals and breads. ? Brown rice. ? Beans. ? Fresh fruits and vegetables. Activity   If possible, have someone help you care for your baby and help with household activities for at least a few days after you leave the hospital.  Return to your normal activities as told by your health care provider. Ask your health care provider what activities are safe for you.  Rest as much as possible. Try to rest or take a nap while your baby is sleeping.  Do not lift anything that is heavier than 10 lbs (4.5 kg), or the limit that you were told, until your health care provider says that it is safe.  Talk with your health care provider about when you  can engage in sexual activity. This may depend on your: ? Risk of infection. ? How fast you heal. ? Comfort and desire to engage in sexual activity. General instructions  Do not use tampons or douches until your health care provider approves.  Wear loose, comfortable clothing and a supportive and well-fitting bra.  Keep your perineum clean and dry. Wipe from front to back when you use the toilet.  If you pass a blood clot, save it and call your health care provider to discuss. Do not flush blood clots down the toilet before you get instructions from your health care provider.  Keep all follow-up visits for you and your baby as told by your health care provider. This is important. Contact a health care provider if:  You have: ? A fever. ? Bad-smelling vaginal discharge. ? Pus or a bad smell coming from your incision. ? Difficulty or pain when urinating. ? A sudden increase or decrease in the frequency of your bowel movements. ? More redness, swelling, or pain around your incision. ? More fluid or blood coming from your incision. ? A rash. ? Nausea. ? Little or no interest in activities you used to enjoy. ? Questions about caring for yourself or your baby.  Your incision feels warm to the touch.  Your breasts turn red or become painful or hard.  You feel unusually sad or worried.  You vomit.  You pass a blood clot from your vagina.  You urinate more than usual.  You are dizzy or light-headed. Get help right away if:  You have: ? Pain that does not go away or get better with medicine. ? Chest pain. ? Difficulty breathing. ? Blurred vision or spots in your vision. ? Thoughts about hurting yourself or your baby. ? New pain in your abdomen or in one of your legs. ? A severe headache.  You faint.  You bleed from your vagina so much that you fill more than one sanitary pad in one hour. Bleeding should not be heavier than your heaviest period. Summary  After the  procedure, it is common to have pain at your incision site, abdominal cramping, and slight bleeding from your vagina.  Check your incision area every day for signs of infection.  Tell your health care provider about any unusual symptoms.  Keep all follow-up visits for you and your baby as told by your health care provider. This information is not intended to replace advice given to you by your health care provider. Make sure you discuss any questions you have with your health care provider. Document Revised: 03/11/2018 Document Reviewed: 03/11/2018 Elsevier Patient Education  2020 [employer].

## 2020-04-07 NOTE — Progress Notes (Signed)
Postop Note Day # 3  S:  Patient resting comfortable in bed.  Noted some worsening pain yesterday, improved with medication. Tolerating gen diet. + flatus, + BM.  Lochia minimal.  + ambulation.  No F/C/N/V SOB, or CP.  Pt plans on breastfeeding- though having difficulty- being seen by lactation  O: Temp:  [97.9 F (36.6 C)-98.5 F (36.9 C)] 98.5 F (36.9 C) (07/23 0152) Pulse Rate:  [69-89] 69 (07/23 0548) Resp:  [16-18] 16 (07/23 0548) BP: (120-146)/(74-105) 137/91 (07/23 0548) SpO2:  [100 %] 100 % (07/23 0152)   Gen: A&Ox3, NAD CV: RRR Resp: CTAB Abdomen: soft, NT, ND, +BS Uterus: firm, non-tender, below umbilicus Incision: c/d/i, bandage on Ext: No edema, no calf tenderness bilaterally, SCDs in place  Labs:  Results for orders placed or performed during the hospital encounter of 04/03/20 (from the past 24 hour(s))  Glucose, capillary     Status: Abnormal   Collection Time: 04/06/20  8:24 AM  Result Value Ref Range   Glucose-Capillary 105 (H) 70 - 99 mg/dL  Glucose, capillary     Status: None   Collection Time: 04/07/20  7:30 AM  Result Value Ref Range   Glucose-Capillary 82 70 - 99 mg/dL    A/P: Pt is a 29 y.o. G1P1001 s/p primary C-section, POD#3 - Preeclampsia  BP starting to increase yesterday- started on procardia and increased to 60XL today  Plan to continue to monitor closely, q4hr BP check -GDMA2  Within normal limits - Pain doing well with current regimen -GU: voiding freely -GI: tolerating general diet -Activity: encouraged sitting up to chair and ambulation as tolerated -Prophylaxis: SCDs in place, Lovenox daily -Labs: stable as above -Baby boy circ completed  DISPO: Discharge pending BP management  Myna Hidalgo, DO [phone] (cell) [phone] (office)

## 2020-04-08 ENCOUNTER — Encounter (HOSPITAL_COMMUNITY): Payer: Self-pay | Admitting: Obstetrics & Gynecology

## 2020-04-08 ENCOUNTER — Other Ambulatory Visit: Payer: Self-pay

## 2020-04-08 ENCOUNTER — Inpatient Hospital Stay (HOSPITAL_COMMUNITY)
Admission: AD | Admit: 2020-04-08 | Discharge: 2020-04-08 | Disposition: A | Discharge status: Home / Self Care | Payer: BC Managed Care – PPO | Attending: Obstetrics & Gynecology | Admitting: Obstetrics & Gynecology

## 2020-04-08 DIAGNOSIS — O9089 Other complications of the puerperium, not elsewhere classified: Secondary | ICD-10-CM

## 2020-04-08 DIAGNOSIS — O1405 Mild to moderate pre-eclampsia, complicating the puerperium: Secondary | ICD-10-CM | POA: Diagnosis not present

## 2020-04-08 DIAGNOSIS — O165 Unspecified maternal hypertension, complicating the puerperium: Secondary | ICD-10-CM | POA: Insufficient documentation

## 2020-04-08 DIAGNOSIS — J45909 Unspecified asthma, uncomplicated: Secondary | ICD-10-CM | POA: Diagnosis not present

## 2020-04-08 DIAGNOSIS — O99893 Other specified diseases and conditions complicating puerperium: Secondary | ICD-10-CM | POA: Insufficient documentation

## 2020-04-08 DIAGNOSIS — R519 Headache, unspecified: Secondary | ICD-10-CM | POA: Insufficient documentation

## 2020-04-08 DIAGNOSIS — N83209 Unspecified ovarian cyst, unspecified side: Secondary | ICD-10-CM | POA: Insufficient documentation

## 2020-04-08 DIAGNOSIS — R1110 Vomiting, unspecified: Secondary | ICD-10-CM | POA: Diagnosis not present

## 2020-04-08 DIAGNOSIS — O1495 Unspecified pre-eclampsia, complicating the puerperium: Secondary | ICD-10-CM | POA: Insufficient documentation

## 2020-04-08 LAB — CBC
HCT: 33.9 % — ABNORMAL LOW (ref 36.0–46.0)
Hemoglobin: 11.6 g/dL — ABNORMAL LOW (ref 12.0–15.0)
MCH: 30.9 pg (ref 26.0–34.0)
MCHC: 34.2 g/dL (ref 30.0–36.0)
MCV: 90.4 fL (ref 80.0–100.0)
Platelets: 208 10*3/uL (ref 150–400)
RBC: 3.75 MIL/uL — ABNORMAL LOW (ref 3.87–5.11)
RDW: 13 % (ref 11.5–15.5)
WBC: 11.6 10*3/uL — ABNORMAL HIGH (ref 4.0–10.5)
nRBC: 0 % (ref 0.0–0.2)

## 2020-04-08 LAB — COMPREHENSIVE METABOLIC PANEL
ALT: 29 U/L (ref 0–44)
AST: 27 U/L (ref 15–41)
Albumin: 2.4 g/dL — ABNORMAL LOW (ref 3.5–5.0)
Alkaline Phosphatase: 88 U/L (ref 38–126)
Anion gap: 10 (ref 5–15)
BUN: 7 mg/dL (ref 6–20)
CO2: 26 mmol/L (ref 22–32)
Calcium: 8.9 mg/dL (ref 8.9–10.3)
Chloride: 105 mmol/L (ref 98–111)
Creatinine, Ser: 0.57 mg/dL (ref 0.44–1.00)
GFR calc Af Amer: >60 mL/min (ref >60)
GFR calc non Af Amer: >60 mL/min (ref >60)
Glucose, Bld: 197 mg/dL — ABNORMAL HIGH (ref 70–99)
Potassium: 4 mmol/L (ref 3.5–5.1)
Sodium: 141 mmol/L (ref 135–145)
Total Bilirubin: 0.4 mg/dL (ref 0.3–1.2)
Total Protein: 5.5 g/dL — ABNORMAL LOW (ref 6.5–8.1)

## 2020-04-08 MED ORDER — DIPHENHYDRAMINE HCL 25 MG PO CAPS
50.0000 mg | ORAL_CAPSULE | Freq: Once | ORAL | Status: AC
  Administered 2020-04-08: 50 mg via ORAL
  Filled 2020-04-08: qty 2

## 2020-04-08 MED ORDER — MAGNESIUM OXIDE 400 (241.3 MG) MG PO TABS
400.0000 mg | ORAL_TABLET | Freq: Once | ORAL | Status: AC
  Administered 2020-04-08: 400 mg via ORAL
  Filled 2020-04-08: qty 1

## 2020-04-08 MED ORDER — PROCHLORPERAZINE MALEATE 10 MG PO TABS
10.0000 mg | ORAL_TABLET | Freq: Once | ORAL | Status: AC
  Administered 2020-04-08: 10 mg via ORAL
  Filled 2020-04-08: qty 1

## 2020-04-08 MED ORDER — OXYCODONE HCL 5 MG PO TABS
5.0000 mg | ORAL_TABLET | Freq: Once | ORAL | Status: AC
  Administered 2020-04-08: 5 mg via ORAL
  Filled 2020-04-08: qty 1

## 2020-04-08 NOTE — MAU Provider Note (Signed)
[facility] Attending MAU Note  Chief Complaint: Postpartum Complications, Emesis, and Hypertension  First Provider Initiated Contact with Patient 04/08/20 0129      SUBJECTIVE Catherine Christian is a 29 y.o. G1P1001 who presents 4 days after a PLTCS for headache and vomiting. Patient with known mild Pre-E and was started on Procardia 60 mg XL which she last took yesterday morning. Reports her headache began this afternoon and she has taken Tylenol, Ibuprofen and Oxycodone without much relief. Reports some vomiting due to headache but currently does not feel nauseous. She does have a history of migraines as well. Denies vision changes, SOB, chest pain, RUQ pain, severe-range pressures. Lochia is appropriate. Prevena in place of incision site. Denies fever, chills, dysuria, diarrhea, constipation, abdominal pain.   Past Medical History:  Diagnosis Date  . Adnexal cyst   . Asthma    Childhood, exercise induced  . Gestational diabetes   . Headache   . Hepatic steatosis    OB History  Gravida Para Term Preterm AB Living  1 1 1  0 0 1  SAB TAB Ectopic Multiple Live Births  0 0 0 0 1    # Outcome Date GA Lbr Len/2nd Weight Sex Delivery Anes PTL Lv  1 Term 04/04/20 [redacted]w[redacted]d  3860 g M CS-LTranv Spinal  LIV   Past Surgical History:  Procedure Laterality Date  . APPENDECTOMY    . CESAREAN SECTION    . CHOLECYSTECTOMY  02/2016  . CYST EXCISION  age 68   neck   Social History   Socioeconomic History  . Marital status: Married    Spouse name: Catherine Christian  . Number of children: Not on file  . Years of education: Not on file  . Highest education level: Not on file  Occupational History  . Not on file  Tobacco Use  . Smoking status: Never Smoker  . Smokeless tobacco: Never Used  Vaping Use  . Vaping Use: Never used  Substance and Sexual Activity  . Alcohol use: Not Currently    Alcohol/week: 0.0 standard drinks    Comment: once a week  . Drug use: No  . Sexual  activity: Yes  Other Topics Concern  . Not on file  Social History Narrative   She is a [employer].   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   She lives with twin sister.    Social Determinants of Health   Financial Resource Strain:   . Difficulty of Paying Living Expenses:   Food Insecurity:   . Worried About Pension scheme manager in the Last Year:   . Programme researcher, broadcasting/film/video in the Last Year:   Transportation Needs:   . Barista (Medical):   Freight forwarder Lack of Transportation (Non-Medical):   Physical Activity:   . Days of Exercise per Week:   . Minutes of Exercise per Session:   Stress:   . Feeling of Stress :   Social Connections:   . Frequency of Communication with Friends and Family:   . Frequency of Social Gatherings with Friends and Family:   . Attends Religious Services:   . Active Member of Clubs or Organizations:   . Attends [name] Meetings:   Banker Marital Status:   Intimate Partner Violence:   . Fear of Current or Ex-Partner:   . Emotionally Abused:   [name] Physically Abused:   . Sexually Abused:    No current facility-administered medications on file prior to encounter.  Current Outpatient Medications on File Prior to Encounter  Medication Sig Dispense Refill  . acetaminophen (TYLENOL) 500 MG tablet Take 1,000 mg by mouth every 6 (six) hours as needed for mild pain or headache.    . cetirizine (ZYRTEC) 10 MG tablet Take 10 mg by mouth daily.    [name] docusate sodium (COLACE) 100 MG capsule Take 1 capsule (100 mg total) by mouth 2 (two) times daily. 60 capsule 0  . fluticasone (FLONASE) 50 MCG/ACT nasal spray Place 1 spray into both nostrils daily.     [name] ibuprofen (ADVIL) 600 MG tablet Take 1 tablet (600 mg total) by mouth every 6 (six) hours. 30 tablet 0  . LIDOCAINE-MENTHOL ROLL-ON EX Apply 1 application topically as needed (stiff neck).    [name] NIFEdipine (ADALAT CC) 60 MG 24 hr tablet Take 1 tablet (60 mg total) by mouth daily. 90 tablet 0  .  oxyCODONE (OXY IR/ROXICODONE) 5 MG immediate release tablet Take 1-2 tablets (5-10 mg total) by mouth every 6 (six) hours as needed for up to 7 days for moderate pain or severe pain. 24 tablet 0  . phenylephrine (SUDAFED PE) 10 MG TABS tablet Take 10 mg by mouth every 4 (four) hours as needed (congestion).    . Prenatal Vit-Fe Fumarate-FA (MULTIVITAMIN-PRENATAL) 27-0.8 MG TABS tablet Take 1 tablet by mouth daily at 12 noon.     Allergies  Allergen Reactions  . Cabbage Nausea Only    ROS: Pertinent items in HPI  OBJECTIVE BP (!) 144/90   Pulse 74   Temp 98.1 F (36.7 C) (Oral)   Resp 20   Ht 5\' 4"  (1.626 m)   Wt (!) 111.7 kg   SpO2 98%   BMI 42.26 kg/m  CONSTITUTIONAL: Well-developed, well-nourished female in no acute distress.  HENT:  Normocephalic, atraumatic, External right and left ear normal. Oropharynx is clear and moist EYES: Conjunctivae and EOM are normal. Pupils are equal, round, and reactive to light. No scleral icterus.  NECK: Normal range of motion, supple, no masses.  Normal thyroid.  SKIN: Skin is warm and dry. No rash noted. Not diaphoretic. No erythema. No pallor. NEUROLGIC: Alert and oriented to person, place, and time. Normal reflexes, muscle tone coordination. No cranial nerve deficit noted.  PSYCHIATRIC: Normal mood and affect. Normal behavior. Normal judgment and thought content. CARDIOVASCULAR: Normal heart rate noted RESPIRATORY: Effort normal  ABDOMEN: Soft, no distention; Prevena in place MUSCULOSKELETAL: Normal range of motion. No tenderness.  No cyanosis, clubbing, or edema.  2+ distal pulses.  LAB RESULTS Results for orders placed or performed during the hospital encounter of 04/08/20 (from the past 48 hour(s))  CBC     Status: Abnormal   Collection Time: 04/08/20  1:43 AM  Result Value Ref Range   WBC 11.6 (H) 4.0 - 10.5 K/uL   RBC 3.75 (L) 3.87 - 5.11 MIL/uL   Hemoglobin 11.6 (L) 12.0 - 15.0 g/dL   HCT 04/10/20 (L) 36 - 46 %   MCV 90.4 80.0 - 100.0  fL   MCH 30.9 26.0 - 34.0 pg   MCHC 34.2 30.0 - 36.0 g/dL   RDW 60.7 37.1 - 06.2 %   Platelets 208 150 - 400 K/uL   nRBC 0.0 0.0 - 0.2 %    Comment: Performed at [facility], [address], [place], [place]  Comprehensive metabolic panel     Status: Abnormal   Collection Time: 04/08/20  1:43 AM  Result Value Ref Range  Sodium 141 135 - 145 mmol/L   Potassium 4.0 3.5 - 5.1 mmol/L   Chloride 105 98 - 111 mmol/L   CO2 26 22 - 32 mmol/L   Glucose, Bld 197 (H) 70 - 99 mg/dL    Comment: Glucose reference range applies only to samples taken after fasting for at least 8 hours.   BUN 7 6 - 20 mg/dL   Creatinine, Ser 1.69 0.44 - 1.00 mg/dL   Calcium 8.9 8.9 - 67.8 mg/dL   Total Protein 5.5 (L) 6.5 - 8.1 g/dL   Albumin 2.4 (L) 3.5 - 5.0 g/dL   AST 27 15 - 41 U/L   ALT 29 0 - 44 U/L   Alkaline Phosphatase 88 38 - 126 U/L   Total Bilirubin 0.4 0.3 - 1.2 mg/dL   GFR calc non Af Amer >60 >60 mL/min   GFR calc Af Amer >60 >60 mL/min   Anion gap 10 5 - 15    Comment: Performed at [facility], [address] [place], [place] [address]    IMAGING [place] MFM FETAL BPP WO NON STRESS  Result Date: 03/29/2020 ----------------------------------------------------------------------  OBSTETRICS REPORT                       (Signed Final 03/29/2020 12:34 pm) ---------------------------------------------------------------------- Patient Info  ID #:       [id number]                          D.O.B.:  1991/08/21 (29 yrs)  Name:       Catherine Christian               Visit Date: 03/28/2020 02:19 pm ---------------------------------------------------------------------- Performed By  Attending:        Lin Landsman      Ref. Address:     [address]                    MD                                                             [facility]                                                             [address]                                                             [place], [place]                                                              [address]  Performed By:  Devin Vics             Secondary Phy.:   [facility] MAU/Triage                    RDMS,RVT  Referred By:      Alessandra Bevels             [facility]         [facility] ---------------------------------------------------------------------- Orders  #  Description                           Code        Ordered By  1  [place] MFM OB FOLLOW UP                   76816.01    YU FANG  2  [place] MFM FETAL BPP WO NON               76819.01    YU FANG     STRESS ----------------------------------------------------------------------  #  Order #                     Accession #                Episode #  1  [id number]                   [id number]                 [id number]  2  [id number]                   [id number]                 [id number] ---------------------------------------------------------------------- Indications  Gestational diabetes in pregnancy,             O24.415  controlled by oral hypoglycemic drugs  Maternal morbid obesity                        O99.210 E66.01  [redacted] weeks gestation of pregnancy                Z3A.37  Asthma  O99.89 j45.909  Rh negative state in antepartum (Rhogam        O36.0190  given 11-09-19)  Subchorionic hemorrhage, antepartum (17        O45.90  weeks- No VB since)  Declined genetic testing  Encounter for other antenatal screening        Z36.2  follow-up ---------------------------------------------------------------------- Fetal Evaluation  Num Of Fetuses:         1  Fetal Heart Rate(bpm):  138  Cardiac Activity:       Observed  Presentation:           Cephalic  Placenta:               Posterior  P. Cord Insertion:      Not well visualized  Amniotic Fluid  AFI FV:      Within normal limits  AFI  Sum(cm)     %Tile       Largest Pocket(cm)  13.9            52          4.8  RUQ(cm)       RLQ(cm)       LUQ(cm)        LLQ(cm)  4.7           2.3           4.8            2.1 ---------------------------------------------------------------------- Biophysical Evaluation  Amniotic F.V:   Within normal limits       F. Tone:        Observed  F. Movement:    Observed                   Score:          8/8  F. Breathing:   Observed ---------------------------------------------------------------------- Biometry  BPD:      97.6  mm     G. Age:  40w 0d       > 99  %    CI:        73.08   %    70 - 86                                                          FL/HC:      19.7   %    20.8 - 22.6  HC:      362.9  mm     G. Age:  42w 6d       > 99  %    HC/AC:      0.97        0.92 - 1.05  AC:      375.4  mm     G. Age:  41w 3d       > 99  %    FL/BPD:     73.2   %    71 - 87  FL:       71.4  mm     G. Age:  36w 4d         35  %    FL/AC:      19.0   %    20 - 24  HUM:      64.9  mm  G. Age:  37w 4d         81  %  Est. FW:    4119  gm      9 lb 1 oz   > 99  % ---------------------------------------------------------------------- OB History  Gravidity:    1         Term:   0        Prem:   0        SAB:   0  TOP:          0       Ectopic:  0        Living: 0 ---------------------------------------------------------------------- Gestational Age  U/S Today:     40w 2d                                        EDD:   03/26/20  Best:          37w 1d     Det. By:  Marcella Dubs         EDD:   04/17/20 ---------------------------------------------------------------------- Anatomy  Cranium:               Appears normal         Aortic Arch:            Not well visualized  Cavum:                 Previously seen        Ductal Arch:            Not well visualized  Ventricles:            Previously seen        Diaphragm:              Previously seen  Choroid Plexus:        Previously seen        Stomach:                Appears normal, left                                                                         sided  Cerebellum:            Not well visualized    Abdomen:                Appears normal  Posterior Fossa:       Not well visualized    Abdominal Wall:         Not well visualized  Nuchal Fold:           Not applicable (>20    Cord Vessels:           Previously seen                         wks GA)  Face:                  Not well visualized    Kidneys:  Appear normal  Lips:                  Previously seen        Bladder:                Previously seen  Palate:                Not well visualized    Spine:                  Limited views                                                                        appear nl prev seen  Heart:                 Not well visualized    Upper Extremities:      prev Visualized  RVOT:                  Not well visualized    Lower Extremities:      prev Visualized  LVOT:                  Not well visualized  Other:  Fetus appears to be a female. Technically difficult due to advanced GA          and fetal position, and maternal body habitus. ---------------------------------------------------------------------- Cervix Uterus Adnexa  Cervix  Not visualized (advanced GA >24wks) ---------------------------------------------------------------------- Impression  Follow up growth for A2GDM  Normal interval growth with measurements greater than  dates. Ms. Riles EFW 99th%. She reports good blood  sugar control.  Suboptimal views of the fetal anatomy were again observed,  secondary to fetal position and maternal habitus.  Biophysical profile 8/8 there is good fetal movement and  amniotic fluid.  She continue metformin 1000mg  at bedtime. ---------------------------------------------------------------------- Recommendations  Continue weekly testing  Consider delivery by 39 weeks, sooner if maternal blood  sugar is poorly controlled. ----------------------------------------------------------------------                Lin Landsman, MD Electronically Signed Final Report   03/29/2020 12:34 pm ----------------------------------------------------------------------  [place] MFM OB FOLLOW UP  Result Date: 03/29/2020 ----------------------------------------------------------------------  OBSTETRICS REPORT                       (Signed Final 03/29/2020 12:34 pm) ---------------------------------------------------------------------- Patient Info  ID #:       [id number]                          D.O.B.:  Aug 17, 1991 (29 yrs)  Name:       Catherine Christian               Visit Date: 03/28/2020 02:19 pm ---------------------------------------------------------------------- Performed By  Attending:        Lin Landsman      Ref. Address:     [address]                                                             [address]                                                             [place], [place]                                                             [address]  Performed By:     Tomma Lightning             Secondary Phy.:   [facility] MAU/Triage                    RDMS,RVT  Referred By:      Alessandra Bevels             Location[facility]         [facility] ---------------------------------------------------------------------- Orders  #  Description                           Code        Ordered By  1  [place] MFM OB FOLLOW UP                   76816.01    YU FANG  2  [place] MFM FETAL BPP WO NON               27253.66    YU FANG     STRESS ----------------------------------------------------------------------  #  Order #                     Accession #                Episode #  1  [id number]                   [id number]                 [id number]  2  [id number]                    [id number]                 [id number] ---------------------------------------------------------------------- Indications  Gestational diabetes in pregnancy,             O24.415  controlled by oral hypoglycemic drugs  Maternal morbid obesity                        O99.210 E66.01  [redacted] weeks gestation of pregnancy                Z3A.37  Asthma                                         O99.89 j45.909  Rh negative state in antepartum (Rhogam        O36.0190  given 11-09-19)  Subchorionic hemorrhage, antepartum (17        O45.90  weeks- No VB since)  Declined genetic testing  Encounter for other antenatal screening        Z36.2  follow-up ---------------------------------------------------------------------- Fetal Evaluation  Num Of Fetuses:         1  Fetal Heart Rate(bpm):  138  Cardiac Activity:       Observed  Presentation:           Cephalic  Placenta:               Posterior  P. Cord Insertion:      Not well visualized  Amniotic Fluid  AFI FV:      Within normal limits  AFI Sum(cm)     %Tile       Largest Pocket(cm)  13.9            52          4.8  RUQ(cm)       RLQ(cm)       LUQ(cm)        LLQ(cm)  4.7           2.3           4.8            2.1 ---------------------------------------------------------------------- Biophysical Evaluation  Amniotic F.V:   Within normal limits       F. Tone:        Observed  F. Movement:    Observed                   Score:          8/8  F. Breathing:   Observed ---------------------------------------------------------------------- Biometry  BPD:      97.6  mm     G. Age:  40w 0d       > 99  %    CI:        73.08   %    70 - 86  FL/HC:      19.7   %    20.8 - 22.6  HC:      362.9  mm     G. Age:  42w 6d       > 99  %    HC/AC:      0.97        0.92 - 1.05  AC:      375.4  mm     G. Age:  41w 3d       > 99  %    FL/BPD:     73.2   %    71 - 87  FL:       71.4  mm     G. Age:  36w 4d         35  %    FL/AC:      19.0   %     20 - 24  HUM:      64.9  mm     G. Age:  37w 4d         81  %  Est. FW:    4119  gm      9 lb 1 oz   > 99  % ---------------------------------------------------------------------- OB History  Gravidity:    1         Term:   0        Prem:   0        SAB:   0  TOP:          0       Ectopic:  0        Living: 0 ---------------------------------------------------------------------- Gestational Age  U/S Today:     40w 2d                                        EDD:   03/26/20  Best:          37w 1d     Det. By:  Marcella Dubs         EDD:   04/17/20 ---------------------------------------------------------------------- Anatomy  Cranium:               Appears normal         Aortic Arch:            Not well visualized  Cavum:                 Previously seen        Ductal Arch:            Not well visualized  Ventricles:            Previously seen        Diaphragm:              Previously seen  Choroid Plexus:        Previously seen        Stomach:                Appears normal, left  sided  Cerebellum:            Not well visualized    Abdomen:                Appears normal  Posterior Fossa:       Not well visualized    Abdominal Wall:         Not well visualized  Nuchal Fold:           Not applicable (>20    Cord Vessels:           Previously seen                         wks GA)  Face:                  Not well visualized    Kidneys:                Appear normal  Lips:                  Previously seen        Bladder:                Previously seen  Palate:                Not well visualized    Spine:                  Limited views                                                                        appear nl prev seen  Heart:                 Not well visualized    Upper Extremities:      prev Visualized  RVOT:                  Not well visualized    Lower Extremities:      prev Visualized  LVOT:                  Not well visualized  Other:  Fetus  appears to be a female. Technically difficult due to advanced GA          and fetal position, and maternal body habitus. ---------------------------------------------------------------------- Cervix Uterus Adnexa  Cervix  Not visualized (advanced GA >24wks) ---------------------------------------------------------------------- Impression  Follow up growth for A2GDM  Normal interval growth with measurements greater than  dates. Ms. Millirons EFW 99th%. She reports good blood  sugar control.  Suboptimal views of the fetal anatomy were again observed,  secondary to fetal position and maternal habitus.  Biophysical profile 8/8 there is good fetal movement and  amniotic fluid.  She continue metformin 1000mg  at bedtime. ---------------------------------------------------------------------- Recommendations  Continue weekly testing  Consider delivery by 39 weeks, sooner if maternal blood  sugar is poorly controlled. ----------------------------------------------------------------------               Lin Landsman, MD Electronically Signed Final Report   03/29/2020 12:34 pm ----------------------------------------------------------------------   MAU COURSE Patient seen for headache postpartum in setting on known mild preeclampsia. She took a few  medications at home and headache persisted. No other severe features. Headache relieved completely with Compazine, Benadryl and Mag-Ox in MAU. Mild-range pressures only. Headache could also be side effect of new Procardia. Cont Procardia at this time. Given strict return precautions to come back should headache recur despite Tylenol or if other severe features occur. CBC and CMP WNL. Patient verbalized understanding of discharge and follow-up instructions and was ambulating without assistance upon discharge.  ASSESSMENT 1. Headache in pregnancy, postpartum   2. Preeclampsia in postpartum period     PLAN Discharge home  Follow-up Information    Myna Hidalgo, DO  Follow up.   Specialty: Obstetrics and Gynecology Contact information: [address] [place] [address] [phone]              Allergies as of 04/08/2020      Reactions   Cabbage Nausea Only      Medication List    TAKE these medications   acetaminophen 500 MG tablet Commonly known as: TYLENOL Take 1,000 mg by mouth every 6 (six) hours as needed for mild pain or headache.   cetirizine 10 MG tablet Commonly known as: ZYRTEC Take 10 mg by mouth daily.   docusate sodium 100 MG capsule Commonly known as: Colace Take 1 capsule (100 mg total) by mouth 2 (two) times daily.   fluticasone 50 MCG/ACT nasal spray Commonly known as: FLONASE Place 1 spray into both nostrils daily.   ibuprofen 600 MG tablet Commonly known as: ADVIL Take 1 tablet (600 mg total) by mouth every 6 (six) hours.   LIDOCAINE-MENTHOL ROLL-ON EX Apply 1 application topically as needed (stiff neck).   multivitamin-prenatal 27-0.8 MG Tabs tablet Take 1 tablet by mouth daily at 12 noon.   NIFEdipine 60 MG 24 hr tablet Commonly known as: ADALAT CC Take 1 tablet (60 mg total) by mouth daily.   oxyCODONE 5 MG immediate release tablet Commonly known as: Oxy IR/ROXICODONE Take 1-2 tablets (5-10 mg total) by mouth every 6 (six) hours as needed for up to 7 days for moderate pain or severe pain.   phenylephrine 10 MG Tabs tablet Commonly known as: SUDAFED PE Take 10 mg by mouth every 4 (four) hours as needed (congestion).       Evaluation does not show pathology that would require ongoing emergent intervention or inpatient treatment. Patient is hemodynamically stable and mentating appropriately. Discussed findings and plan with patient, who agrees with care plan. All questions answered. Return precautions discussed and outpatient follow up recommendations given.  Joselyn Arrow, MD 04/08/2020 3:58 AM

## 2020-04-08 NOTE — MAU Note (Addendum)
Pt s/p c/s on Tuesday here with reports of HA and HBP. Had pre-e during pregnancy. Pt reports she was discharged from hospital today with BP meds. Started developing a HA this afternoon. HA is described as throbbing. She took oxycodone and ibuprofen around 2145 this evening and was able to fall asleep. Woke up with an even worse HA so took Tylenol which still has not helped HA. Feels shaky and had one episode of vomiting en route to hospital. She denies vision changes or RUQ pain. Has a history of migraines and reports this HA feels similar to that. She reports that once she was able to throw up it did relieve some of the pressure, which is common with her migraines.

## 2020-04-08 NOTE — Discharge Instructions (Signed)

## 2020-04-08 NOTE — Discharge Summary (Signed)
Postpartum Discharge Summary    Patient Name: Catherine Christian DOB: [birth year] MRN: [id number]  Date of admission: 04/03/2020 Delivery date:04/04/2020  Delivering provider: Janyth Pupa  Date of discharge: 04/07/2020  Admitting diagnosis: Preeclampsia without severe features Intrauterine pregnancy: [redacted]w[redacted]d    Secondary diagnosis:  GDMA2, morbid obesity Additional problems: none    Discharge diagnosis: Term Pregnancy Delivered, Preeclampsia (mild), GDM A2 and morbid obesity                                              Post partum procedures:none Augmentation: N/A Complications: None  Hospital course: Sceduled C/S   29y.o. yo G1P1001 at 33w1das admitted to the hospital 04/03/2020 for scheduled cesarean section with the following indication:Macrosomia.Delivery details are as follows:  Membrane Rupture Time/Date: 5:03 PM ,04/04/2020   Delivery Method:C-Section, Low Transverse  Details of operation can be found in separate operative note.  Patient had an uncomplicated postpartum course.  She is ambulating, tolerating a regular diet, passing flatus, and urinating well. Patient is discharged home in stable condition on  04/08/20        Newborn Data: Birth date:04/04/2020  Birth time:5:03 PM  Gender:Female  Living status:Living  Apgars:8 ,9  Weight:3860 g     Magnesium Sulfate received: No BMZ received: No Rhophylac:No MMR:No T-DaP:Given prenatally Flu: Yes Transfusion:No  Physical exam  Vitals:   04/07/20 0929 04/07/20 1029 04/07/20 1148 04/07/20 1406  BP: (!) 140/91 124/82 (!) 133/82 (!) 135/84  Pulse:      Resp:      Temp:      TempSrc:      SpO2:      Weight:      Height:       General: alert, cooperative and no distress Lochia: appropriate Uterine Fundus: firm Incision: Dressing is clean, dry, and intact DVT Evaluation: No evidence of DVT seen on physical exam. Labs: Lab Results  Component Value Date   WBC 11.6 (H) 04/08/2020   HGB 11.6 (L) 04/08/2020    HCT 33.9 (L) 04/08/2020   MCV 90.4 04/08/2020   PLT 208 04/08/2020   CMP Latest Ref Rng & Units 04/08/2020  Glucose 70 - 99 mg/dL 197(H)  BUN 6 - 20 mg/dL 7  Creatinine 0.44 - 1.00 mg/dL 0.57  Sodium 135 - 145 mmol/L 141  Potassium 3.5 - 5.1 mmol/L 4.0  Chloride 98 - 111 mmol/L 105  CO2 22 - 32 mmol/L 26  Calcium 8.9 - 10.3 mg/dL 8.9  Total Protein 6.5 - 8.1 g/dL 5.5(L)  Total Bilirubin 0.3 - 1.2 mg/dL 0.4  Alkaline Phos 38 - 126 U/L 88  AST 15 - 41 U/L 27  ALT 0 - 44 U/L 29   Edinburgh Score: Edinburgh Postnatal Depression Scale Screening Tool 04/05/2020  I have been able to laugh and see the funny side of things. 0  I have looked forward with enjoyment to things. 1  I have blamed myself unnecessarily when things went wrong. 1  I have been anxious or worried for no good reason. 1  I have felt scared or panicky for no good reason. 0  Things have been getting on top of me. 2  I have been so unhappy that I have had difficulty sleeping. 0  I have felt sad or miserable. 1  I have been so unhappy that I have been  crying. 1  The thought of harming myself has occurred to me. 0  Edinburgh Postnatal Depression Scale Total 7      After visit meds:  Allergies as of 04/07/2020      Reactions   Cabbage Nausea Only      Medication List    STOP taking these medications   metFORMIN 500 MG 24 hr tablet Commonly known as: GLUCOPHAGE-XR     TAKE these medications   acetaminophen 500 MG tablet Commonly known as: TYLENOL Take 1,000 mg by mouth every 6 (six) hours as needed for mild pain or headache.   cetirizine 10 MG tablet Commonly known as: ZYRTEC Take 10 mg by mouth daily.   docusate sodium 100 MG capsule Commonly known as: Colace Take 1 capsule (100 mg total) by mouth 2 (two) times daily.   fluticasone 50 MCG/ACT nasal spray Commonly known as: FLONASE Place 1 spray into both nostrils daily.   ibuprofen 600 MG tablet Commonly known as: ADVIL Take 1 tablet (600 mg  total) by mouth every 6 (six) hours.   LIDOCAINE-MENTHOL ROLL-ON EX Apply 1 application topically as needed (stiff neck).   multivitamin-prenatal 27-0.8 MG Tabs tablet Take 1 tablet by mouth daily at 12 noon.   NIFEdipine 60 MG 24 hr tablet Commonly known as: ADALAT CC Take 1 tablet (60 mg total) by mouth daily.   oxyCODONE 5 MG immediate release tablet Commonly known as: Oxy IR/ROXICODONE Take 1-2 tablets (5-10 mg total) by mouth every 6 (six) hours as needed for up to 7 days for moderate pain or severe pain.   phenylephrine 10 MG Tabs tablet Commonly known as: SUDAFED PE Take 10 mg by mouth every 4 (four) hours as needed (congestion).        Discharge home in stable condition Infant Feeding: Breast Infant Disposition:home with mother Discharge instruction: per After Visit Summary and Postpartum booklet. Activity: Advance as tolerated. Pelvic rest for 6 weeks.  Diet: carb modified diet Anticipated Birth Control: Unsure Postpartum Appointment:1 week Additional Postpartum F/U: BP check 1 week Future Appointments:No future appointments. Follow up Visit:  Follow-up Information    Janyth Pupa, DO Follow up in 1 week(s).   Specialty: Obstetrics and Gynecology Contact information: [address] [place] [address] [phone]                   04/08/2020 Annalee Genta, DO

## 2020-04-17 NOTE — Anesthesia Postprocedure Evaluation (Signed)
Anesthesia Post Note  Patient: Catherine Christian  Procedure(s) Performed: CESAREAN SECTION (N/A Abdomen)     Patient location during evaluation: PACU Anesthesia Type: Spinal Level of consciousness: awake and alert Pain management: pain level controlled Vital Signs Assessment: post-procedure vital signs reviewed and stable Respiratory status: spontaneous breathing and respiratory function stable Cardiovascular status: blood pressure returned to baseline and stable Postop Assessment: spinal receding Anesthetic complications: no   No complications documented.  Last Vitals:  Vitals:   04/07/20 1148 04/07/20 1406  BP: (!) 133/82 (!) 135/84  Pulse:    Resp:    Temp:    SpO2:      Last Pain:  Vitals:   04/07/20 1115  TempSrc:   PainSc: 3                  Lewie Loron

## 2020-06-26 ENCOUNTER — Ambulatory Visit (INDEPENDENT_AMBULATORY_CARE_PROVIDER_SITE_OTHER): Payer: BC Managed Care – PPO | Admitting: Family Medicine

## 2020-06-26 ENCOUNTER — Other Ambulatory Visit: Payer: Self-pay

## 2020-06-26 ENCOUNTER — Encounter (INDEPENDENT_AMBULATORY_CARE_PROVIDER_SITE_OTHER): Payer: Self-pay | Admitting: Family Medicine

## 2020-06-26 VITALS — BP 120/86 | HR 96 | Temp 98.2°F | Ht 64.0 in | Wt 236.0 lb

## 2020-06-26 DIAGNOSIS — Z6841 Body Mass Index (BMI) 40.0 and over, adult: Secondary | ICD-10-CM

## 2020-06-26 DIAGNOSIS — R0602 Shortness of breath: Secondary | ICD-10-CM

## 2020-06-26 DIAGNOSIS — R5383 Other fatigue: Secondary | ICD-10-CM | POA: Diagnosis not present

## 2020-06-26 DIAGNOSIS — Z1331 Encounter for screening for depression: Secondary | ICD-10-CM

## 2020-06-26 DIAGNOSIS — Z8759 Personal history of other complications of pregnancy, childbirth and the puerperium: Secondary | ICD-10-CM

## 2020-06-26 DIAGNOSIS — O1493 Unspecified pre-eclampsia, third trimester: Secondary | ICD-10-CM

## 2020-06-26 DIAGNOSIS — R7303 Prediabetes: Secondary | ICD-10-CM | POA: Diagnosis not present

## 2020-06-26 DIAGNOSIS — Z9189 Other specified personal risk factors, not elsewhere classified: Secondary | ICD-10-CM | POA: Diagnosis not present

## 2020-06-26 DIAGNOSIS — Z0289 Encounter for other administrative examinations: Secondary | ICD-10-CM

## 2020-06-27 LAB — CBC WITH DIFFERENTIAL/PLATELET
Basophils Absolute: 0.1 10*3/uL (ref 0.0–0.2)
Basos: 2 %
EOS (ABSOLUTE): 0.2 10*3/uL (ref 0.0–0.4)
Eos: 3 %
Hematocrit: 43.7 % (ref 34.0–46.6)
Hemoglobin: 14.2 g/dL (ref 11.1–15.9)
Immature Grans (Abs): 0 10*3/uL (ref 0.0–0.1)
Immature Granulocytes: 0 %
Lymphocytes Absolute: 2.4 10*3/uL (ref 0.7–3.1)
Lymphs: 35 %
MCH: 28.4 pg (ref 26.6–33.0)
MCHC: 32.5 g/dL (ref 31.5–35.7)
MCV: 87 fL (ref 79–97)
Monocytes Absolute: 0.4 10*3/uL (ref 0.1–0.9)
Monocytes: 5 %
Neutrophils Absolute: 3.6 10*3/uL (ref 1.4–7.0)
Neutrophils: 55 %
Platelets: 267 10*3/uL (ref 150–450)
RBC: 5 x10E6/uL (ref 3.77–5.28)
RDW: 12.1 % (ref 11.7–15.4)
WBC: 6.7 10*3/uL (ref 3.4–10.8)

## 2020-06-27 LAB — COMPREHENSIVE METABOLIC PANEL
ALT: 40 IU/L — ABNORMAL HIGH (ref 0–32)
AST: 25 IU/L (ref 0–40)
Albumin/Globulin Ratio: 1.9 (ref 1.2–2.2)
Albumin: 4.7 g/dL (ref 3.9–5.0)
Alkaline Phosphatase: 82 IU/L (ref 44–121)
BUN/Creatinine Ratio: 20 (ref 9–23)
BUN: 12 mg/dL (ref 6–20)
Bilirubin Total: 0.4 mg/dL (ref 0.0–1.2)
CO2: 25 mmol/L (ref 20–29)
Calcium: 9.7 mg/dL (ref 8.7–10.2)
Chloride: 102 mmol/L (ref 96–106)
Creatinine, Ser: 0.59 mg/dL (ref 0.57–1.00)
GFR calc Af Amer: 143 mL/min/{1.73_m2} (ref >59)
GFR calc non Af Amer: 124 mL/min/{1.73_m2} (ref >59)
Globulin, Total: 2.5 g/dL (ref 1.5–4.5)
Glucose: 100 mg/dL — ABNORMAL HIGH (ref 65–99)
Potassium: 4.6 mmol/L (ref 3.5–5.2)
Sodium: 140 mmol/L (ref 134–144)
Total Protein: 7.2 g/dL (ref 6.0–8.5)

## 2020-06-27 LAB — T3: T3, Total: 143 ng/dL (ref 71–180)

## 2020-06-27 LAB — VITAMIN D 25 HYDROXY (VIT D DEFICIENCY, FRACTURES): Vit D, 25-Hydroxy: 30.3 ng/mL (ref 30.0–100.0)

## 2020-06-27 LAB — LIPID PANEL WITH LDL/HDL RATIO
Cholesterol, Total: 159 mg/dL (ref 100–199)
HDL: 63 mg/dL (ref >39)
LDL Chol Calc (NIH): 81 mg/dL (ref 0–99)
LDL/HDL Ratio: 1.3 ratio (ref 0.0–3.2)
Triglycerides: 77 mg/dL (ref 0–149)
VLDL Cholesterol Cal: 15 mg/dL (ref 5–40)

## 2020-06-27 LAB — VITAMIN B12: Vitamin B-12: 682 pg/mL (ref 232–1245)

## 2020-06-27 LAB — T4: T4, Total: 8.1 ug/dL (ref 4.5–12.0)

## 2020-06-27 LAB — TSH: TSH: 1.65 u[IU]/mL (ref 0.450–4.500)

## 2020-06-27 LAB — INSULIN, RANDOM: INSULIN: 14.3 u[IU]/mL (ref 2.6–24.9)

## 2020-06-27 LAB — FOLATE: Folate: 6.9 ng/mL (ref >3.0)

## 2020-06-28 NOTE — Progress Notes (Signed)
Chief Complaint:   OBESITY Catherine Christian (MR# [id number]) is a 29 y.o. female who presents for evaluation and treatment of obesity and related comorbidities. Current BMI is Body mass index is 40.51 kg/m. Catherine Christian has been struggling with her weight for many years and has been unsuccessful in either losing weight, maintaining weight loss, or reaching her healthy weight goal.  Catherine Christian is currently in the action stage of change and ready to dedicate time achieving and maintaining a healthier weight. Catherine Christian is interested in becoming our patient and working on intensive lifestyle modifications including (but not limited to) diet and exercise for weight loss.  Catherine Christian delivered on 04/04/2020 at 38 weeks.  She is breastfeeding.  For breakfast, she has an egg casserole with mushrooms, green pepper, onions, ham, and tater tots (eating ~1 egg) and feels satisfied.  For a snack she will have cashews (1/4 cup 2-3 times per day).  For lunch, Lean Cuisine protein bowls or salad kits with chicken (tiny tenders).  For snack, cashews.  Dinner consists of a salad kit, 4.5 ounces of meat, and 1 cup of a starch.  After dinner - pistachios.   Catherine Christian's habits were reviewed today and are as follows: Her family eats meals together, she thinks her family will eat healthier with her, her desired weight loss is 88 pounds, she started gaining weight in college, her heaviest weight ever was 270 pounds, she craves sweets and potatoes, she snacks frequently in the evenings, she wakes up frequently in the middle of the night to eat, she is frequently drinking liquids with calories, she frequently makes poor food choices, she has problems with excessive hunger, she frequently eats larger portions than normal and she struggles with emotional eating.  Depression Screen Catherine Christian's Food and Mood (modified PHQ-9) score was 8.  Depression screen PHQ 2/9 06/26/2020  Decreased Interest 2  Down, Depressed, Hopeless 1  PHQ - 2  Score 3  Altered sleeping 0  Tired, decreased energy 1  Change in appetite 2  Feeling bad or failure about yourself  1  Trouble concentrating 1  Moving slowly or fidgety/restless 0  Suicidal thoughts 0  PHQ-9 Score 8  Difficult doing work/chores Not difficult at all   Subjective:   1. Other fatigue Catherine Christian admits to daytime somnolence and reports waking up still tired. Patent has a history of symptoms of daytime fatigue, morning fatigue, morning headache and snoring. Catherine Christian generally gets 6 hours of sleep per night, and states that she has poor quality sleep, but has a baby. Snoring is present. Apneic episodes are not present. Epworth Sleepiness Score is 13.  2. SOB (shortness of breath) on exertion Catherine Christian notes increasing shortness of breath with exercising and seems to be worsening over time with weight gain. She notes getting out of breath sooner with activity than she used to. This has gotten worse recently. Catherine Christian denies shortness of breath at rest or orthopnea.  3. Pre-diabetes Catherine Christian has a diagnosis of prediabetes based on her elevated HgA1c and was informed this puts her at greater risk of developing diabetes. She continues to work on diet and exercise to decrease her risk of diabetes. She denies nausea or hypoglycemia.  Her A1c on September 2 was 5.8.  She has a history of gestational diabetes.  Lab Results  Component Value Date   INSULIN 14.3 06/26/2020   4. History of pre-eclampsia She was previously on Nifedipine.  Blood pressure is well-controlled today.  5. Depression screening Catherine Christian was screened  for depression today as part of her new patient workup.  6. At risk for diabetes mellitus Catherine Christian is at higher than average risk for developing diabetes due to her obesity.   Assessment/Plan:   1. Other fatigue Catherine Christian does feel that her weight is causing her energy to be lower than it should be. Fatigue may be related to obesity, depression or many other causes.  Labs will be ordered, and in the meanwhile, Catherine Christian will focus on self care including making healthy food choices, increasing physical activity and focusing on stress reduction.  - EKG 12-Lead - Vitamin B12 - Folate - T3 - T4 - TSH - VITAMIN D 25 Hydroxy (Vit-D Deficiency, Fractures)  2. SOB (shortness of breath) on exertion Catherine Christian does feel that she gets out of breath more easily that she used to when she exercises. Catherine Christian's shortness of breath appears to be obesity related and exercise induced. She has agreed to work on weight loss and gradually increase exercise to treat her exercise induced shortness of breath. Will continue to monitor closely.  - CBC with Differential/Platelet - Lipid Panel With LDL/HDL Ratio  3. Pre-diabetes Catherine Christian will continue to work on weight loss, exercise, and decreasing simple carbohydrates to help decrease the risk of diabetes.  Will check insulin level today.  - Insulin, random  4. History of pre-eclampsia EKG, follow-up blood pressure at subsequent visit.  - Comprehensive metabolic panel  5. Depression screening Catherine Christian had a positive depression screening. Depression is commonly associated with obesity and often results in emotional eating behaviors. We will monitor this closely and work on CBT to help improve the non-hunger eating patterns. Referral to Psychology may be required if no improvement is seen as she continues in our clinic.  6. At risk for diabetes mellitus Catherine Christian was given approximately 15 minutes of diabetes education and counseling today. We discussed intensive lifestyle modifications today with an emphasis on weight loss as well as increasing exercise and decreasing simple carbohydrates in her diet. We also reviewed medication options with an emphasis on risk versus benefit of those discussed.   Repetitive spaced learning was employed today to elicit superior memory formation and behavioral change.  7. Class 3 severe obesity with  serious comorbidity and body mass index (BMI) of 40.0 to 44.9 in adult, unspecified obesity type (HCC)  Catherine Christian is currently in the action stage of change and her goal is to continue with weight loss efforts. I recommend Catherine Christian begin the structured treatment plan as follows:  She has agreed to the Category 4 Plan.  Exercise goals: No exercise has been prescribed at this time.   Behavioral modification strategies: increasing lean protein intake, meal planning and cooking strategies, keeping healthy foods in the home and planning for success.  She was informed of the importance of frequent follow-up visits to maximize her success with intensive lifestyle modifications for her multiple health conditions. She was informed we would discuss her lab results at her next visit unless there is a critical issue that needs to be addressed sooner. Catherine Christian agreed to keep her next visit at the agreed upon time to discuss these results.  Objective:   Blood pressure 120/86, pulse 96, temperature 98.2 F (36.8 C), temperature source Oral, height $RemoveBefo'5\' 4"'pHqwQGwxmXh$  (1.626 m), weight 236 lb (107 kg), last menstrual period 07/12/2019, SpO2 96 %, unknown if currently breastfeeding. Body mass index is 40.51 kg/m.  EKG: Normal sinus rhythm, rate 91 bpm.  Indirect Calorimeter completed today shows a VO2 of 331 and a  REE of 2307.  Her calculated basal metabolic rate is 2482 thus her basal metabolic rate is better than expected.  General: Cooperative, alert, well developed, in no acute distress. HEENT: Conjunctivae and lids unremarkable. Cardiovascular: Regular rhythm.  Lungs: Normal work of breathing. Neurologic: No focal deficits.   Lab Results  Component Value Date   CREATININE 0.59 06/26/2020   BUN 12 06/26/2020   NA 140 06/26/2020   K 4.6 06/26/2020   CL 102 06/26/2020   CO2 25 06/26/2020   Lab Results  Component Value Date   ALT 40 (H) 06/26/2020   AST 25 06/26/2020   ALKPHOS 82 06/26/2020   BILITOT 0.4  06/26/2020   Lab Results  Component Value Date   INSULIN 14.3 06/26/2020   Lab Results  Component Value Date   TSH 1.650 06/26/2020   Lab Results  Component Value Date   CHOL 159 06/26/2020   HDL 63 06/26/2020   LDLCALC 81 06/26/2020   TRIG 77 06/26/2020   Lab Results  Component Value Date   WBC 6.7 06/26/2020   HGB 14.2 06/26/2020   HCT 43.7 06/26/2020   MCV 87 06/26/2020   PLT 267 06/26/2020   Attestation Statements:   Reviewed by clinician on day of visit: allergies, medications, problem list, medical history, surgical history, family history, social history, and previous encounter notes.  I, Water quality scientist, CMA, am acting as transcriptionist for Coralie Common, MD.  This is the patient's first visit at [facility]. The patient's NEW PATIENT PACKET was reviewed at length. Included in the packet: current and past health history, medications, allergies, ROS, gynecologic history (women only), surgical history, family history, social history, weight history, weight loss surgery history (for those that have had weight loss surgery), nutritional evaluation, mood and food questionnaire, PHQ9, Epworth questionnaire, sleep habits questionnaire, patient life and health improvement goals questionnaire. These will all be scanned into the patient's chart under media.   During the visit, I independently reviewed the patient's EKG, bioimpedance scale results, and indirect calorimeter results. I used this information to tailor a meal plan for the patient that will help her to lose weight and will improve her obesity-related conditions going forward. I performed a medically necessary appropriate examination and/or evaluation. I discussed the assessment and treatment plan with the patient. The patient was provided an opportunity to ask questions and all were answered. The patient agreed with the plan and demonstrated an understanding of the instructions. Labs were ordered at this  visit and will be reviewed at the next visit unless more critical results need to be addressed immediately. Clinical information was updated and documented in the EMR.   Time spent on visit including pre-visit chart review and post-visit care was 45 minutes.   A separate 15 minutes was spent on risk counseling (see above).  I have reviewed the above documentation for accuracy and completeness, and I agree with the above. - Jinny Blossom, MD

## 2020-07-10 ENCOUNTER — Other Ambulatory Visit: Payer: Self-pay

## 2020-07-10 ENCOUNTER — Ambulatory Visit (INDEPENDENT_AMBULATORY_CARE_PROVIDER_SITE_OTHER): Payer: BC Managed Care – PPO | Admitting: Family Medicine

## 2020-07-10 ENCOUNTER — Encounter (INDEPENDENT_AMBULATORY_CARE_PROVIDER_SITE_OTHER): Payer: Self-pay | Admitting: Family Medicine

## 2020-07-10 VITALS — BP 110/72 | HR 81 | Temp 98.0°F | Ht 64.0 in | Wt 226.0 lb

## 2020-07-10 DIAGNOSIS — E559 Vitamin D deficiency, unspecified: Secondary | ICD-10-CM | POA: Diagnosis not present

## 2020-07-10 DIAGNOSIS — Z9189 Other specified personal risk factors, not elsewhere classified: Secondary | ICD-10-CM | POA: Diagnosis not present

## 2020-07-10 DIAGNOSIS — R7303 Prediabetes: Secondary | ICD-10-CM

## 2020-07-10 DIAGNOSIS — R7401 Elevation of levels of liver transaminase levels: Secondary | ICD-10-CM

## 2020-07-10 DIAGNOSIS — Z6838 Body mass index (BMI) 38.0-38.9, adult: Secondary | ICD-10-CM

## 2020-07-10 MED ORDER — VITAMIN D-3 125 MCG (5000 UT) PO TABS: 1.0000 | ORAL_TABLET | Freq: Every day | ORAL | 0 refills | Status: DC

## 2020-07-12 NOTE — Progress Notes (Signed)
Chief Complaint:   OBESITY Brynlie is here to discuss her progress with her obesity treatment plan along with follow-up of her obesity related diagnoses. Yanilen is on the Category 4 Plan and states she is following her eating plan approximately 100% of the time. Saraia states she is walking for 30 minutes 3 times per week.  Today's visit was #: 2 Starting weight: 236 lbs Starting date: 06/26/2020 Today's weight: 226 lbs Today's date: 07/10/2020 Total lbs lost to date: 10 Total lbs lost since last in-office visit: 10  Interim History: Rosemaria did eat out with [place] food this past weekend otherwise she followed the plan completely. She did feel some hunger and cravings the first few days. She needs some help navigating how to eat out. 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  Subjective:   1. Vitamin D deficiency Wednesday denies nausea, vomiting, or muscle weakness, but notes fatigue. She is not on Vit D supplementation.  2. Transaminitis Timber's AST was 25, ALT 40, and Alk phos 82. Her ultrasound in 2017 was showing hepatomegaly and steatosis.   3. Pre-diabetes Rache's A1c was 5.8 and insulin 14.3. She has a history of gestational diabetes.  4. At risk for diabetes mellitus Maresha is at higher than average risk for developing diabetes due to her obesity.   Assessment/Plan:   1. Vitamin D deficiency Low Vitamin D level contributes to fatigue and are associated with obesity, breast, and colon cancer. Lakindra agreed to start prescription Vitamin D 50,000 IU every week with no refills. She will follow-up for routine testing of Vitamin D, at least 2-3 times per year to avoid over-replacement.  - Cholecalciferol (VITAMIN D-3) 125 MCG (5000 UT) TABS; Take 1 tablet by mouth daily.  Dispense: 30 tablet; Refill: 0  2. Transaminitis We will follow up on labs in 3 months. Karema will follow as directed.  3. Pre-diabetes Nelani will continue to  work on weight loss, exercise, and decreasing simple carbohydrates to help decrease the risk of diabetes. We will follow up on labs in 3 months.  4. At risk for diabetes mellitus Shatonia was given approximately 30 minutes of diabetes education and counseling today. We discussed intensive lifestyle modifications today with an emphasis on weight loss as well as increasing exercise and decreasing simple carbohydrates in her diet. We also reviewed medication options with an emphasis on risk versus benefit of those discussed.   Repetitive spaced learning was employed today to elicit superior memory formation and behavioral change.  5. Class 2 severe obesity with serious comorbidity and body mass index (BMI) of 38.0 to 38.9 in adult, unspecified obesity type (HCC) Blannie is currently in the action stage of change. As such, her goal is to continue with weight loss efforts. She has agreed to the Category 4 Plan.   Exercise goals: All adults should avoid inactivity. Some physical activity is better than none, and adults who participate in any amount of physical activity gain some health benefits.  Behavioral modification strategies: increasing lean protein intake, meal planning and cooking strategies, keeping healthy foods in the home and planning for success.  Grayce has agreed to follow-up with our clinic in 2 weeks. She was informed of the importance of frequent follow-up visits to maximize her success with intensive lifestyle modifications for her multiple health conditions.   Objective:   Blood pressure 110/72, pulse 81, temperature 98 F (36.7 C), height $RemoveBe'5\' 4"'uHpgpUtdA$  (1.626 m), weight 226 lb (102.5 kg), SpO2  95 %, unknown if currently breastfeeding. Body mass index is 38.79 kg/m.  General: Cooperative, alert, well developed, in no acute distress. HEENT: Conjunctivae and lids unremarkable. Cardiovascular: Regular rhythm.  Lungs: Normal work of breathing. Neurologic: No focal deficits.   Lab  Results  Component Value Date   CREATININE 0.59 06/26/2020   BUN 12 06/26/2020   NA 140 06/26/2020   K 4.6 06/26/2020   CL 102 06/26/2020   CO2 25 06/26/2020   Lab Results  Component Value Date   ALT 40 (H) 06/26/2020   AST 25 06/26/2020   ALKPHOS 82 06/26/2020   BILITOT 0.4 06/26/2020   No results found for: HGBA1C Lab Results  Component Value Date   INSULIN 14.3 06/26/2020   Lab Results  Component Value Date   TSH 1.650 06/26/2020   Lab Results  Component Value Date   CHOL 159 06/26/2020   HDL 63 06/26/2020   LDLCALC 81 06/26/2020   TRIG 77 06/26/2020   Lab Results  Component Value Date   WBC 6.7 06/26/2020   HGB 14.2 06/26/2020   HCT 43.7 06/26/2020   MCV 87 06/26/2020   PLT 267 06/26/2020   No results found for: IRON, TIBC, FERRITIN  Attestation Statements:   Reviewed by clinician on day of visit: allergies, medications, problem list, medical history, surgical history, family history, social history, and previous encounter notes.   I, Trixie Dredge, am acting as transcriptionist for Coralie Common, MD.  I have reviewed the above documentation for accuracy and completeness, and I agree with the above. - Jinny Blossom, MD

## 2020-07-25 ENCOUNTER — Ambulatory Visit (INDEPENDENT_AMBULATORY_CARE_PROVIDER_SITE_OTHER): Payer: BC Managed Care – PPO | Admitting: Family Medicine

## 2020-07-25 ENCOUNTER — Other Ambulatory Visit: Payer: Self-pay

## 2020-07-25 ENCOUNTER — Encounter (INDEPENDENT_AMBULATORY_CARE_PROVIDER_SITE_OTHER): Payer: Self-pay | Admitting: Family Medicine

## 2020-07-25 VITALS — BP 109/72 | HR 86 | Temp 98.9°F | Ht 64.0 in | Wt 219.0 lb

## 2020-07-25 DIAGNOSIS — Z6837 Body mass index (BMI) 37.0-37.9, adult: Secondary | ICD-10-CM | POA: Diagnosis not present

## 2020-07-25 DIAGNOSIS — R7303 Prediabetes: Secondary | ICD-10-CM

## 2020-07-25 DIAGNOSIS — E559 Vitamin D deficiency, unspecified: Secondary | ICD-10-CM

## 2020-07-26 NOTE — Progress Notes (Signed)
Chief Complaint:   OBESITY Tonye is here to discuss her progress with her obesity treatment plan along with follow-up of her obesity related diagnoses. Zaelyn is on the Category 4 Plan and states she is following her eating plan approximately 90-95% of the time. Kamariah states she is walking for 30-45 minutes 3 times per week.  Today's visit was #: 3 Starting weight: 236 lbs Starting date: 06/26/2020 Today's weight: 219 lbs Today's date: 07/25/2020 Total lbs lost to date: 17 Total lbs lost since last in-office visit: 7  Interim History: Leane has taken advantage of the nice weather this week, so she has been walking more. She has followed the meal plan more and more. Some days she has eaten more than allotted calories. She denies being hungry. For Thanksgiving she does mostly fondue the day before and typically Thanksgiving day.  Subjective:   1. Vitamin D deficiency Magali denies nausea, vomiting, or muscle weakness, but she notes fatigue. She is not on prescription Vit D.  2. Pre-diabetes Shantoria notes minimal carbohydrate cravings that aren't satisfied with snack calories (she is using Halo fruit bars and Yasso bars).  Assessment/Plan:   1. Vitamin D deficiency Low Vitamin D level contributes to fatigue and are associated with obesity, breast, and colon cancer. Mishayla agreed to continue taking OTC Vitamin D and will follow-up for routine testing of Vitamin D, at least 2-3 times per year to avoid over-replacement.  2. Pre-diabetes Blayre will continue to work on weight loss, exercise, and decreasing simple carbohydrates to help decrease the risk of diabetes. We will follow up on labs in February 2022.  3. Class 2 severe obesity with serious comorbidity and body mass index (BMI) of 37.0 to 37.9 in adult, unspecified obesity type (HCC) Tymeshia is currently in the action stage of change. As such, her goal is to continue with weight loss efforts. She has agreed to the  Category 4 Plan.   Exercise goals: As is.  Behavioral modification strategies: increasing lean protein intake, meal planning and cooking strategies and keeping healthy foods in the home.  Oriel has agreed to follow-up with our clinic in 3 weeks. She was informed of the importance of frequent follow-up visits to maximize her success with intensive lifestyle modifications for her multiple health conditions.   Objective:   Blood pressure 109/72, pulse 86, temperature 98.9 F (37.2 C), height 5\' 4"  (1.626 m), weight 219 lb (99.3 kg), SpO2 98 %, currently breastfeeding. Body mass index is 37.59 kg/m.  General: Cooperative, alert, well developed, in no acute distress. HEENT: Conjunctivae and lids unremarkable. Cardiovascular: Regular rhythm.  Lungs: Normal work of breathing. Neurologic: No focal deficits.   Lab Results  Component Value Date   CREATININE 0.59 06/26/2020   BUN 12 06/26/2020   NA 140 06/26/2020   K 4.6 06/26/2020   CL 102 06/26/2020   CO2 25 06/26/2020   Lab Results  Component Value Date   ALT 40 (H) 06/26/2020   AST 25 06/26/2020   ALKPHOS 82 06/26/2020   BILITOT 0.4 06/26/2020   No results found for: HGBA1C Lab Results  Component Value Date   INSULIN 14.3 06/26/2020   Lab Results  Component Value Date   TSH 1.650 06/26/2020   Lab Results  Component Value Date   CHOL 159 06/26/2020   HDL 63 06/26/2020   LDLCALC 81 06/26/2020   TRIG 77 06/26/2020   Lab Results  Component Value Date   WBC 6.7 06/26/2020   HGB 14.2  06/26/2020   HCT 43.7 06/26/2020   MCV 87 06/26/2020   PLT 267 06/26/2020   No results found for: IRON, TIBC, FERRITIN  Attestation Statements:   Reviewed by clinician on day of visit: allergies, medications, problem list, medical history, surgical history, family history, social history, and previous encounter notes.  Time spent on visit including pre-visit chart review and post-visit care and charting was 15 minutes.    I,  Burt Knack, am acting as transcriptionist for Reuben Likes, MD.  I have reviewed the above documentation for accuracy and completeness, and I agree with the above. - Katherina Mires, MD

## 2020-08-16 ENCOUNTER — Other Ambulatory Visit: Payer: Self-pay

## 2020-08-16 ENCOUNTER — Ambulatory Visit (INDEPENDENT_AMBULATORY_CARE_PROVIDER_SITE_OTHER): Payer: BC Managed Care – PPO | Admitting: Family Medicine

## 2020-08-16 ENCOUNTER — Encounter (INDEPENDENT_AMBULATORY_CARE_PROVIDER_SITE_OTHER): Payer: Self-pay | Admitting: Family Medicine

## 2020-08-16 VITALS — BP 113/74 | HR 87 | Temp 98.6°F | Ht 64.0 in | Wt 213.0 lb

## 2020-08-16 DIAGNOSIS — Z9189 Other specified personal risk factors, not elsewhere classified: Secondary | ICD-10-CM | POA: Diagnosis not present

## 2020-08-16 DIAGNOSIS — E559 Vitamin D deficiency, unspecified: Secondary | ICD-10-CM

## 2020-08-16 DIAGNOSIS — Z6836 Body mass index (BMI) 36.0-36.9, adult: Secondary | ICD-10-CM

## 2020-08-16 DIAGNOSIS — R7401 Elevation of levels of liver transaminase levels: Secondary | ICD-10-CM | POA: Diagnosis not present

## 2020-08-16 MED ORDER — VITAMIN D-3 125 MCG (5000 UT) PO TABS: 1.0000 | ORAL_TABLET | Freq: Every day | ORAL | 0 refills | Status: DC

## 2020-08-17 NOTE — Progress Notes (Signed)
Chief Complaint:   OBESITY Quyen is here to discuss her progress with her obesity treatment plan along with follow-up of her obesity related diagnoses. Everlee is on the Category 4 Plan and states she is following her eating plan approximately 90-95% of the time. Tahni states she is walking for 30-45 minutes 3 times per week.  Today's visit was #: 4 Starting weight: 236 lbs Starting date: 06/26/2020 Today's weight: 213 lbs Today's date: 08/16/2020 Total lbs lost to date: 23 lbs Total lbs lost since last in-office visit: 6 lbs  Interim History: Theodore went and visited family for Thanksgiving.  She reports that the next few weeks are going to be busy with numerous group celebrations.  She had a few indulgences over the holiday.  She has a 43-month-old boy.  Subjective:   1. Vitamin D deficiency Lousie's Vitamin D level was 30.3 on 06/26/2020. She is currently taking prescription vitamin D 50,000 IU each week. She denies nausea, vomiting or muscle weakness.  She endorses fatigue.  2. Transaminitis Last ALT 40, AST 25, alk phos 82.  No history of hepatitis.   3. At risk for osteoporosis Valyncia is at higher risk of osteopenia and osteoporosis due to Vitamin D deficiency.   Assessment/Plan:   1. Vitamin D deficiency Low Vitamin D level contributes to fatigue and are associated with obesity, breast, and colon cancer. She agrees to continue to take prescription Vitamin D $RemoveB'@50'AkBuGkmX$ ,000 IU every week and will follow-up for routine testing of Vitamin D, at least 2-3 times per year to avoid over-replacement.  -Refill Cholecalciferol (VITAMIN D-3) 125 MCG (5000 UT) TABS; Take 1 tablet by mouth daily.  Dispense: 30 tablet; Refill: 0  2. Transaminitis Repeat labs in February 2022.  3. At risk for osteoporosis Paula was given approximately 15 minutes of osteoporosis prevention counseling today. Veryl is at risk for osteopenia and osteoporosis due to her Vitamin D deficiency. She was  encouraged to take her Vitamin D and follow her higher calcium diet and increase strengthening exercise to help strengthen her bones and decrease her risk of osteopenia and osteoporosis.  Repetitive spaced learning was employed today to elicit superior memory formation and behavioral change.  4. Class 2 severe obesity with serious comorbidity and body mass index (BMI) of 36.0 to 36.9 in adult, unspecified obesity type (HCC)  Kalianne is currently in the action stage of change. As such, her goal is to continue with weight loss efforts. She has agreed to the Category 4 Plan.   Exercise goals: No exercise has been prescribed at this time.  Behavioral modification strategies: increasing lean protein intake, meal planning and cooking strategies, keeping healthy foods in the home and planning for success.  Karagan has agreed to follow-up with our clinic in 4 weeks. She was informed of the importance of frequent follow-up visits to maximize her success with intensive lifestyle modifications for her multiple health conditions.   Objective:   Blood pressure 113/74, pulse 87, temperature 98.6 F (37 C), temperature source Oral, height $RemoveBefo'5\' 4"'mXmAoYCbGpD$  (1.626 m), weight 213 lb (96.6 kg), SpO2 98 %, currently breastfeeding. Body mass index is 36.56 kg/m.  General: Cooperative, alert, well developed, in no acute distress. HEENT: Conjunctivae and lids unremarkable. Cardiovascular: Regular rhythm.  Lungs: Normal work of breathing. Neurologic: No focal deficits.   Lab Results  Component Value Date   CREATININE 0.59 06/26/2020   BUN 12 06/26/2020   NA 140 06/26/2020   K 4.6 06/26/2020   CL 102 06/26/2020  CO2 25 06/26/2020   Lab Results  Component Value Date   ALT 40 (H) 06/26/2020   AST 25 06/26/2020   ALKPHOS 82 06/26/2020   BILITOT 0.4 06/26/2020   Lab Results  Component Value Date   INSULIN 14.3 06/26/2020   Lab Results  Component Value Date   TSH 1.650 06/26/2020   Lab Results  Component  Value Date   CHOL 159 06/26/2020   HDL 63 06/26/2020   LDLCALC 81 06/26/2020   TRIG 77 06/26/2020   Lab Results  Component Value Date   WBC 6.7 06/26/2020   HGB 14.2 06/26/2020   HCT 43.7 06/26/2020   MCV 87 06/26/2020   PLT 267 06/26/2020   Attestation Statements:   Reviewed by clinician on day of visit: allergies, medications, problem list, medical history, surgical history, family history, social history, and previous encounter notes.  I, Water quality scientist, CMA, am acting as transcriptionist for Coralie Common, MD.  I have reviewed the above documentation for accuracy and completeness, and I agree with the above. - Jinny Blossom, MD

## 2020-09-07 ENCOUNTER — Other Ambulatory Visit (INDEPENDENT_AMBULATORY_CARE_PROVIDER_SITE_OTHER): Payer: Self-pay | Admitting: Family Medicine

## 2020-09-07 DIAGNOSIS — E559 Vitamin D deficiency, unspecified: Secondary | ICD-10-CM

## 2020-09-14 ENCOUNTER — Other Ambulatory Visit (INDEPENDENT_AMBULATORY_CARE_PROVIDER_SITE_OTHER): Payer: Self-pay | Admitting: Family Medicine

## 2020-09-14 DIAGNOSIS — E559 Vitamin D deficiency, unspecified: Secondary | ICD-10-CM

## 2020-09-20 ENCOUNTER — Telehealth (INDEPENDENT_AMBULATORY_CARE_PROVIDER_SITE_OTHER): Payer: Self-pay

## 2020-09-20 ENCOUNTER — Telehealth (INDEPENDENT_AMBULATORY_CARE_PROVIDER_SITE_OTHER): Payer: 59 | Admitting: Family Medicine

## 2020-09-20 ENCOUNTER — Encounter (INDEPENDENT_AMBULATORY_CARE_PROVIDER_SITE_OTHER): Payer: Self-pay | Admitting: Family Medicine

## 2020-09-20 DIAGNOSIS — R7303 Prediabetes: Secondary | ICD-10-CM | POA: Diagnosis not present

## 2020-09-20 DIAGNOSIS — Z6836 Body mass index (BMI) 36.0-36.9, adult: Secondary | ICD-10-CM

## 2020-09-20 DIAGNOSIS — E559 Vitamin D deficiency, unspecified: Secondary | ICD-10-CM | POA: Diagnosis not present

## 2020-09-20 MED ORDER — VITAMIN D-3 125 MCG (5000 UT) PO TABS: 1.0000 | ORAL_TABLET | Freq: Every day | ORAL | 0 refills | Status: DC

## 2020-09-20 NOTE — Telephone Encounter (Signed)
I connected with  Catherine Christian on 09/20/20 by a video enabled telemedicine application and verified that I am speaking with the correct person using two identifiers.   I discussed the limitations of evaluation and management by telemedicine. The patient expressed understanding and agreed to proceed.

## 2020-10-02 NOTE — Progress Notes (Signed)
TeleHealth Visit:  Due to the COVID-19 pandemic, this visit was completed with telemedicine (audio/video) technology to reduce patient and provider exposure as well as to preserve personal protective equipment.   Catherine Christian has verbally consented to this TeleHealth visit. The patient is located at home, the provider is located at the [facility] office. The participants in this visit include the listed provider and patient. The visit was conducted today via Mychart video.  Chief Complaint: OBESITY Catherine Christian is here to discuss her progress with her obesity treatment plan along with follow-up of her obesity related diagnoses. Catherine Christian is on the Category 4 Plan and states she is following her eating plan approximately 75% of the time. Catherine Christian states she is not exercising since the last visit.  Today's visit was #: 5 Starting weight: 236 lbs Starting date: 06/26/20  Interim History: Catherine Christian is currently dealing with Covid and really isn't feeling well. She is starting to feel better, but has been sick for approximately 5 days. Catherine Christian was not following the plan as strictly as she did prior. Ma reports a weight of 208 pounds.  Subjective:   1. Vitamin D deficiency Catherine Christian's Vitamin D level was 30.3 on 06/26/20. She is currently taking prescription vitamin D 5,000 each day as she is breastfeeding. She notes fatigue and denies nausea, vomiting or muscle weakness.   2. Prediabetes Catherine Christian is still having some carb cravings and she is not on medication. She has a diagnosis of prediabetes based on her elevated HgA1c and was informed this puts her at greater risk of developing diabetes. She continues to work on diet and exercise to decrease her risk of diabetes. She denies nausea or hypoglycemia.  No results found for: HGBA1C Lab Results  Component Value Date   INSULIN 14.3 06/26/2020    Assessment/Plan:   1. Vitamin D deficiency Low Vitamin D level contributes to fatigue and  are associated with obesity, breast, and colon cancer. She agrees to continue to take prescription Vitamin D @5 ,000 IU every day #30 and will follow-up for routine testing of Vitamin D, at least 2-3 times per year to avoid over-replacement.  - Cholecalciferol (VITAMIN D-3) 125 MCG (5000 UT) TABS; Take 1 tablet by mouth daily.  Dispense: 30 tablet; Refill: 0  2. Prediabetes Catherine Christian will continue to work on weight loss, exercise, and decreasing simple carbohydrates to help decrease the risk of diabetes. We will draw follow up labs at the end of February.  3. Class 2 severe obesity with serious comorbidity and body mass index (BMI) of 36.0 to 36.9 in adult, unspecified obesity type (HCC)  Catherine Christian is currently in the action stage of change. As such, her goal is to continue with weight loss efforts. She has agreed to the Category 4 Plan.   Exercise goals: No exercise has been prescribed at this time.  Behavioral modification strategies: increasing lean protein intake, meal planning and cooking strategies, keeping healthy foods in the home and planning for success.  Catherine Christian has agreed to follow-up with our clinic in 2 weeks. She was informed of the importance of frequent follow-up visits to maximize her success with intensive lifestyle modifications for her multiple health conditions.  Objective:   VITALS: Per patient if applicable, see vitals. GENERAL: Alert and in no acute distress. CARDIOPULMONARY: No increased WOB. Speaking in clear sentences.  PSYCH: Pleasant and cooperative. Speech normal rate and rhythm. Affect is appropriate. Insight and judgement are appropriate. Attention is focused, linear, and appropriate.  NEURO: Oriented as  arrived to appointment on time with no prompting.   Lab Results  Component Value Date   CREATININE 0.59 06/26/2020   BUN 12 06/26/2020   NA 140 06/26/2020   K 4.6 06/26/2020   CL 102 06/26/2020   CO2 25 06/26/2020   Lab Results  Component Value Date    ALT 40 (H) 06/26/2020   AST 25 06/26/2020   ALKPHOS 82 06/26/2020   BILITOT 0.4 06/26/2020   No results found for: HGBA1C Lab Results  Component Value Date   INSULIN 14.3 06/26/2020   Lab Results  Component Value Date   TSH 1.650 06/26/2020   Lab Results  Component Value Date   CHOL 159 06/26/2020   HDL 63 06/26/2020   LDLCALC 81 06/26/2020   TRIG 77 06/26/2020   Lab Results  Component Value Date   WBC 6.7 06/26/2020   HGB 14.2 06/26/2020   HCT 43.7 06/26/2020   MCV 87 06/26/2020   PLT 267 06/26/2020   No results found for: IRON, TIBC, FERRITIN  Attestation Statements:   Reviewed by clinician on day of visit: allergies, medications, problem list, medical history, surgical history, family history, social history, and previous encounter notes.  IKirke Corin, CMA, am acting as transcriptionist for Reuben Likes, MD  I have reviewed the above documentation for accuracy and completeness, and I agree with the above. - Katherina Mires, MD

## 2020-10-03 ENCOUNTER — Encounter (INDEPENDENT_AMBULATORY_CARE_PROVIDER_SITE_OTHER): Payer: Self-pay | Admitting: Family Medicine

## 2020-10-03 ENCOUNTER — Other Ambulatory Visit: Payer: Self-pay

## 2020-10-03 ENCOUNTER — Telehealth (INDEPENDENT_AMBULATORY_CARE_PROVIDER_SITE_OTHER): Payer: 59 | Admitting: Family Medicine

## 2020-10-03 DIAGNOSIS — Z6838 Body mass index (BMI) 38.0-38.9, adult: Secondary | ICD-10-CM | POA: Diagnosis not present

## 2020-10-03 DIAGNOSIS — R7303 Prediabetes: Secondary | ICD-10-CM

## 2020-10-03 DIAGNOSIS — E559 Vitamin D deficiency, unspecified: Secondary | ICD-10-CM | POA: Diagnosis not present

## 2020-10-04 NOTE — Progress Notes (Signed)
TeleHealth Visit:  Due to the COVID-19 pandemic, this visit was completed with telemedicine (audio/video) technology to reduce patient and provider exposure as well as to preserve personal protective equipment.   Kaylise Shalice Woodring has verbally consented to this TeleHealth visit. The patient is located at home, the provider is located at the [facility] office. The participants in this visit include the listed provider and patient and. The visit was conducted today via Mychart Video.   Chief Complaint: OBESITY Tymber is here to discuss her progress with her obesity treatment plan along with follow-up of her obesity related diagnoses. Shayden is on the Category 4 Plan and states she is following her eating plan approximately 85-90% of the time. Kaeleigh states she is not exercising.  Today's visit was #: 5 Starting weight: 236 lbs Starting date: 06/26/2020  Interim History: Tyjanae is recovering from COVID currently. Last 2 weeks have been better in terms of compliance to meal plan. She is feeling some hunger related to start of menstrual cycle. Tyrihanna feels satisfied with meal plan for most part. She is getting in full 8-10 oz at dinner. Doing yasso bar and 70-100 calorie snack packs for her snacks.   Subjective:   Prediabetes Karsten has a diagnosis of prediabetes based on her elevated HgA1c and was informed this puts her at greater risk of developing diabetes. She continues to work on diet and exercise to decrease her risk of diabetes. She denies nausea or hypoglycemia. Wajiha's last A1c was 5.8 in September 2021. She is not on mediation.  No results found for: HGBA1C Lab Results  Component Value Date   INSULIN 14.3 06/26/2020   Vitamin D deficiency Ardenia is on prescription Vitamin D. She denies nausea, vomiting, or muscle weakness but notes fatigue.  Assessment/Plan:   Prediabetes Jaima will continue to work on weight loss, exercise, and decreasing  simple carbohydrates to help decrease the risk of diabetes. Will repeat labs in 1 month.  Vitamin D deficiency Navayah will continue Vitamin D. No refill needed.  Class 2 severe obesity with serious comorbidity and body mass index (BMI) of 38.0 to 38.9 in adult, unspecified obesity type (HCC)  Shaunita is currently in the action stage of change. As such, her goal is to continue with weight loss efforts. She has agreed to the Category 4 Plan.   Exercise goals: No exercise has been prescribed at this time.  Behavioral modification strategies: increasing lean protein intake, meal planning and cooking strategies, keeping healthy foods in the home and planning for success.  Dianca has agreed to follow-up with our clinic in 2 weeks on 10/16/2020. She was informed of the importance of frequent follow-up visits to maximize her success with intensive lifestyle modifications for her multiple health conditions.   Objective:   VITALS: Per patient if applicable, see vitals. GENERAL: Alert and in no acute distress. CARDIOPULMONARY: No increased WOB. Speaking in clear sentences.  PSYCH: Pleasant and cooperative. Speech normal rate and rhythm. Affect is appropriate. Insight and judgement are appropriate. Attention is focused, linear, and appropriate.  NEURO: Oriented as arrived to appointment on time with no prompting.   Lab Results  Component Value Date   CREATININE 0.59 06/26/2020   BUN 12 06/26/2020   NA 140 06/26/2020   K 4.6 06/26/2020   CL 102 06/26/2020   CO2 25 06/26/2020   Lab Results  Component Value Date   ALT 40 (H) 06/26/2020   AST 25 06/26/2020   ALKPHOS 82 06/26/2020  BILITOT 0.4 06/26/2020   No results found for: HGBA1C Lab Results  Component Value Date   INSULIN 14.3 06/26/2020   Lab Results  Component Value Date   TSH 1.650 06/26/2020   Lab Results  Component Value Date   CHOL 159 06/26/2020   HDL 63 06/26/2020   LDLCALC 81 06/26/2020   TRIG 77 06/26/2020    Lab Results  Component Value Date   WBC 6.7 06/26/2020   HGB 14.2 06/26/2020   HCT 43.7 06/26/2020   MCV 87 06/26/2020   PLT 267 06/26/2020   No results found for: IRON, TIBC, FERRITIN  Attestation Statements:   Reviewed by clinician on day of visit: allergies, medications, problem list, medical history, surgical history, family history, social history, and previous encounter notes.    I, Delorse Limber, am acting as transcriptionist for Reuben Likes, MD. I have reviewed the above documentation for accuracy and completeness, and I agree with the above. - Katherina Mires, MD

## 2020-10-05 ENCOUNTER — Encounter (INDEPENDENT_AMBULATORY_CARE_PROVIDER_SITE_OTHER): Payer: Self-pay

## 2020-10-14 ENCOUNTER — Other Ambulatory Visit (INDEPENDENT_AMBULATORY_CARE_PROVIDER_SITE_OTHER): Payer: Self-pay | Admitting: Family Medicine

## 2020-10-14 DIAGNOSIS — E559 Vitamin D deficiency, unspecified: Secondary | ICD-10-CM

## 2020-10-16 ENCOUNTER — Encounter (INDEPENDENT_AMBULATORY_CARE_PROVIDER_SITE_OTHER): Payer: Self-pay | Admitting: Family Medicine

## 2020-10-16 ENCOUNTER — Ambulatory Visit (INDEPENDENT_AMBULATORY_CARE_PROVIDER_SITE_OTHER): Payer: 59 | Admitting: Family Medicine

## 2020-10-16 ENCOUNTER — Other Ambulatory Visit: Payer: Self-pay

## 2020-10-16 VITALS — BP 113/73 | HR 87 | Temp 98.7°F | Ht 64.0 in | Wt 206.0 lb

## 2020-10-16 DIAGNOSIS — Z6835 Body mass index (BMI) 35.0-35.9, adult: Secondary | ICD-10-CM | POA: Diagnosis not present

## 2020-10-16 DIAGNOSIS — Z9189 Other specified personal risk factors, not elsewhere classified: Secondary | ICD-10-CM

## 2020-10-16 DIAGNOSIS — E559 Vitamin D deficiency, unspecified: Secondary | ICD-10-CM | POA: Diagnosis not present

## 2020-10-16 DIAGNOSIS — Z8632 Personal history of gestational diabetes: Secondary | ICD-10-CM

## 2020-10-16 MED ORDER — VITAMIN D-3 125 MCG (5000 UT) PO TABS: 1.0000 | ORAL_TABLET | Freq: Every day | ORAL | 0 refills | Status: DC

## 2020-10-17 NOTE — Progress Notes (Signed)
Chief Complaint:   OBESITY Catherine Christian is here to discuss her progress with her obesity treatment plan along with follow-up of her obesity related diagnoses. Catherine Christian is on the Category 4 Plan and states she is following her eating plan approximately 90% of the time. Catherine Christian states she is exercising 20-30 minutes 7 times per week.  Today's visit was #: 6 Starting weight: 236 lbs Starting date: 06/26/2020 Today's weight: 206 lbs Today's date: 10/16/2020 Total lbs lost to date: 30 lbs Total lbs lost since last in-office visit: 7 lbs  Interim History: Pt is feeling back to normal. She is doing more consistent physical activity (20-30 mins daily). She denies hunger. Still craving sweets  Like chocolate or chocolate bars. Really ready for spring and warmer weather. 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  Subjective:   1. Vitamin D deficiency Pt denies nausea, vomiting, and muscle weakness but notes fatigue. She is not on prescription Vit D. Pt takes Vit D 5,000 units daily.  2. History of gestational diabetes Pt's last A1c was within normal limits. Insulin level 14.3. She is not on meds.  3. At risk for osteoporosis Catherine Christian is at higher risk of osteopenia and osteoporosis due to Vitamin D deficiency.    Assessment/Plan:   1. Vitamin D deficiency Low Vitamin D level contributes to fatigue and are associated with obesity, breast, and colon cancer. She agrees to continue to take Vit D 5,000 units daily and will follow-up for routine testing of Vitamin D, at least 2-3 times per year to avoid over-replacement.  - Cholecalciferol (VITAMIN D-3) 125 MCG (5000 UT) TABS; Take 1 tablet by mouth daily.  Dispense: 30 tablet; Refill: 0  2. History of gestational diabetes Check labs in 1 month.  3. At risk for osteoporosis Catherine Christian was given approximately 15 minutes of osteoporosis prevention counseling today. Catherine Christian is at risk for osteopenia and osteoporosis due to her Vitamin D  deficiency. She was encouraged to take her Vitamin D and follow her higher calcium diet and increase strengthening exercise to help strengthen her bones and decrease her risk of osteopenia and osteoporosis.  Repetitive spaced learning was employed today to elicit superior memory formation and behavioral change.  4. Class 2 severe obesity with serious comorbidity and body mass index (BMI) of 35.0 to 35.9 in adult, unspecified obesity type (HCC) Catherine Christian is currently in the action stage of change. As such, her goal is to continue with weight loss efforts. She has agreed to the Category 4 Plan.   Exercise goals: As is  Behavioral modification strategies: increasing lean protein intake, meal planning and cooking strategies, keeping healthy foods in the home and planning for success.  Catherine Christian has agreed to follow-up with our clinic in 2 weeks. She was informed of the importance of frequent follow-up visits to maximize her success with intensive lifestyle modifications for her multiple health conditions.   Objective:   Blood pressure 113/73, pulse 87, temperature 98.7 F (37.1 C), temperature source Oral, height 5\' 4"  (1.626 m), weight 206 lb (93.4 kg), last menstrual period 09/27/2020, SpO2 97 %, currently breastfeeding. Body mass index is 35.36 kg/m.  General: Cooperative, alert, well developed, in no acute distress. HEENT: Conjunctivae and lids unremarkable. Cardiovascular: Regular rhythm.  Lungs: Normal work of breathing. Neurologic: No focal deficits.   Lab Results  Component Value Date   CREATININE 0.59 06/26/2020   BUN 12 06/26/2020   NA 140 06/26/2020   K 4.6 06/26/2020   CL 102 06/26/2020  CO2 25 06/26/2020   Lab Results  Component Value Date   ALT 40 (H) 06/26/2020   AST 25 06/26/2020   ALKPHOS 82 06/26/2020   BILITOT 0.4 06/26/2020   No results found for: HGBA1C Lab Results  Component Value Date   INSULIN 14.3 06/26/2020   Lab Results  Component Value Date   TSH  1.650 06/26/2020   Lab Results  Component Value Date   CHOL 159 06/26/2020   HDL 63 06/26/2020   LDLCALC 81 06/26/2020   TRIG 77 06/26/2020   Lab Results  Component Value Date   WBC 6.7 06/26/2020   HGB 14.2 06/26/2020   HCT 43.7 06/26/2020   MCV 87 06/26/2020   PLT 267 06/26/2020   No results found for: IRON, TIBC, FERRITIN   Attestation Statements:   Reviewed by clinician on day of visit: allergies, medications, problem list, medical history, surgical history, family history, social history, and previous encounter notes.  Edmund Hilda, am acting as transcriptionist for Reuben Likes, MD.   I have reviewed the above documentation for accuracy and completeness, and I agree with the above. - Katherina Mires, MD

## 2020-11-02 ENCOUNTER — Encounter (INDEPENDENT_AMBULATORY_CARE_PROVIDER_SITE_OTHER): Payer: Self-pay | Admitting: Family Medicine

## 2020-11-02 ENCOUNTER — Ambulatory Visit (INDEPENDENT_AMBULATORY_CARE_PROVIDER_SITE_OTHER): Payer: 59 | Admitting: Family Medicine

## 2020-11-02 ENCOUNTER — Other Ambulatory Visit: Payer: Self-pay

## 2020-11-02 VITALS — BP 108/71 | HR 76 | Temp 98.4°F | Ht 64.0 in | Wt 203.0 lb

## 2020-11-02 DIAGNOSIS — Z6834 Body mass index (BMI) 34.0-34.9, adult: Secondary | ICD-10-CM

## 2020-11-02 DIAGNOSIS — E559 Vitamin D deficiency, unspecified: Secondary | ICD-10-CM

## 2020-11-02 DIAGNOSIS — E669 Obesity, unspecified: Secondary | ICD-10-CM

## 2020-11-02 DIAGNOSIS — Z9189 Other specified personal risk factors, not elsewhere classified: Secondary | ICD-10-CM

## 2020-11-02 DIAGNOSIS — Z8632 Personal history of gestational diabetes: Secondary | ICD-10-CM | POA: Diagnosis not present

## 2020-11-02 DIAGNOSIS — R7401 Elevation of levels of liver transaminase levels: Secondary | ICD-10-CM | POA: Diagnosis not present

## 2020-11-03 LAB — COMPREHENSIVE METABOLIC PANEL
ALT: 27 IU/L (ref 0–32)
AST: 17 IU/L (ref 0–40)
Albumin/Globulin Ratio: 1.7 (ref 1.2–2.2)
Albumin: 4.3 g/dL (ref 3.9–5.0)
Alkaline Phosphatase: 74 IU/L (ref 44–121)
BUN/Creatinine Ratio: 27 — ABNORMAL HIGH (ref 9–23)
BUN: 19 mg/dL (ref 6–20)
Bilirubin Total: 0.5 mg/dL (ref 0.0–1.2)
CO2: 19 mmol/L — ABNORMAL LOW (ref 20–29)
Calcium: 9.4 mg/dL (ref 8.7–10.2)
Chloride: 105 mmol/L (ref 96–106)
Creatinine, Ser: 0.71 mg/dL (ref 0.57–1.00)
GFR calc Af Amer: 133 mL/min/{1.73_m2} (ref >59)
GFR calc non Af Amer: 115 mL/min/{1.73_m2} (ref >59)
Globulin, Total: 2.6 g/dL (ref 1.5–4.5)
Glucose: 94 mg/dL (ref 65–99)
Potassium: 4.6 mmol/L (ref 3.5–5.2)
Sodium: 142 mmol/L (ref 134–144)
Total Protein: 6.9 g/dL (ref 6.0–8.5)

## 2020-11-03 LAB — INSULIN, RANDOM: INSULIN: 7 u[IU]/mL (ref 2.6–24.9)

## 2020-11-03 LAB — HEMOGLOBIN A1C
Est. average glucose Bld gHb Est-mCnc: 111 mg/dL
Hgb A1c MFr Bld: 5.5 % (ref 4.8–5.6)

## 2020-11-03 LAB — VITAMIN D 25 HYDROXY (VIT D DEFICIENCY, FRACTURES): Vit D, 25-Hydroxy: 50.6 ng/mL (ref 30.0–100.0)

## 2020-11-06 NOTE — Progress Notes (Unsigned)
Chief Complaint:   OBESITY Catherine Christian is here to discuss her progress with her obesity treatment plan along with follow-up of her obesity related diagnoses. Docia is on the Category 4 Plan and states she is following her eating plan approximately 80-85% of the time. Catherine Christian states she is exercising 20-30 minutes 7 times per week.  Today's visit was #: 7 Starting weight: 236 lbs Starting date: 06/26/2020 Today's weight: 203 lbs Today's date: 11/02/2020 Total lbs lost to date: 33 lbs Total lbs lost since last in-office visit: 3 lbs  Interim History: Pt has a [employer] but otherwise has been pretty on plan. She is starting to feel very hungry in mid-afternoon. She is working out 20-30 minutes daily. She has a date night tomorrow. She has a few events in the next few weeks.  Subjective:   1. History of gestational diabetes Pt's last A1c was 5.8 with an insulin level of 14.3. she is not on medication.  2. Vitamin D deficiency Pt denies nausea, vomiting, and muscle weakness but notes fatigue. Pt is on OTC Vit D supplement daily.  3. Transaminitis Pt had elevated LFTs on last CMP. She has no prior history of elevation.  4. At risk for diabetes mellitus Catherine Christian is at higher than average risk for developing diabetes due to obesity.   Assessment/Plan:   1. History of gestational diabetes Obtain A1c, insulin, and CMP today.  - Hemoglobin A1c - Insulin, random  2. Vitamin D deficiency Low Vitamin D level contributes to fatigue and are associated with obesity, breast, and colon cancer. She agrees to continue to take OTC Vitamin D supplement daily and will follow-up for routine testing of Vitamin D, at least 2-3 times per year to avoid over-replacement. Check labs today.  - VITAMIN D 25 Hydroxy (Vit-D Deficiency, Fractures)  3. Transaminitis Obtain CMP today.  - Comprehensive metabolic panel  4. At risk for diabetes mellitus Catherine Christian was given approximately 15 minutes of  diabetes education and counseling today. We discussed intensive lifestyle modifications today with an emphasis on weight loss as well as increasing exercise and decreasing simple carbohydrates in her diet. We also reviewed medication options with an emphasis on risk versus benefit of those discussed.   Repetitive spaced learning was employed today to elicit superior memory formation and behavioral change.  5. Class 1 obesity with serious comorbidity and body mass index (BMI) of 34.0 to 34.9 in adult, unspecified obesity type Catherine Christian is currently in the action stage of change. As such, her goal is to continue with weight loss efforts. She has agreed to the Category 4 Plan + 100 calories.   Exercise goals: As is  Behavioral modification strategies: increasing lean protein intake, meal planning and cooking strategies, keeping healthy foods in the home and planning for success.  Catherine Christian has agreed to follow-up with our clinic in 3 weeks. She was informed of the importance of frequent follow-up visits to maximize her success with intensive lifestyle modifications for her multiple health conditions.   Catherine Christian was informed we would discuss her lab results at her next visit unless there is a critical issue that needs to be addressed sooner. Catherine Christian agreed to keep her next visit at the agreed upon time to discuss these results.  Objective:   Blood pressure 108/71, pulse 76, temperature 98.4 F (36.9 C), temperature source Oral, height 5\' 4"  (1.626 m), weight 203 lb (92.1 kg), last menstrual period 09/27/2020, SpO2 99 %, currently breastfeeding. Body mass index is 34.84  kg/m.  General: Cooperative, alert, well developed, in no acute distress. HEENT: Conjunctivae and lids unremarkable. Cardiovascular: Regular rhythm.  Lungs: Normal work of breathing. Neurologic: No focal deficits.   Lab Results  Component Value Date   CREATININE 0.71 11/02/2020   BUN 19 11/02/2020   NA 142 11/02/2020   K 4.6  11/02/2020   CL 105 11/02/2020   CO2 19 (L) 11/02/2020   Lab Results  Component Value Date   ALT 27 11/02/2020   AST 17 11/02/2020   ALKPHOS 74 11/02/2020   BILITOT 0.5 11/02/2020   Lab Results  Component Value Date   HGBA1C 5.5 11/02/2020   Lab Results  Component Value Date   INSULIN 7.0 11/02/2020   INSULIN 14.3 06/26/2020   Lab Results  Component Value Date   TSH 1.650 06/26/2020   Lab Results  Component Value Date   CHOL 159 06/26/2020   HDL 63 06/26/2020   LDLCALC 81 06/26/2020   TRIG 77 06/26/2020   Lab Results  Component Value Date   WBC 6.7 06/26/2020   HGB 14.2 06/26/2020   HCT 43.7 06/26/2020   MCV 87 06/26/2020   PLT 267 06/26/2020    Attestation Statements:   Reviewed by clinician on day of visit: allergies, medications, problem list, medical history, surgical history, family history, social history, and previous encounter notes.  Edmund Hilda, am acting as transcriptionist for Reuben Likes, MD.   I have reviewed the above documentation for accuracy and completeness, and I agree with the above. - Katherina Mires, MD

## 2020-11-12 ENCOUNTER — Other Ambulatory Visit (INDEPENDENT_AMBULATORY_CARE_PROVIDER_SITE_OTHER): Payer: Self-pay | Admitting: Family Medicine

## 2020-11-12 DIAGNOSIS — E559 Vitamin D deficiency, unspecified: Secondary | ICD-10-CM

## 2020-11-13 NOTE — Telephone Encounter (Signed)
Last seen by Dr. Ukleja. 

## 2020-11-20 ENCOUNTER — Encounter (INDEPENDENT_AMBULATORY_CARE_PROVIDER_SITE_OTHER): Payer: Self-pay

## 2020-11-22 ENCOUNTER — Ambulatory Visit (INDEPENDENT_AMBULATORY_CARE_PROVIDER_SITE_OTHER): Payer: 59 | Admitting: Adult Health

## 2020-11-23 ENCOUNTER — Other Ambulatory Visit: Payer: Self-pay

## 2020-11-23 ENCOUNTER — Ambulatory Visit (INDEPENDENT_AMBULATORY_CARE_PROVIDER_SITE_OTHER): Payer: 59 | Admitting: Family Medicine

## 2020-11-23 ENCOUNTER — Encounter (INDEPENDENT_AMBULATORY_CARE_PROVIDER_SITE_OTHER): Payer: Self-pay | Admitting: Family Medicine

## 2020-11-23 VITALS — BP 98/66 | HR 82 | Temp 99.0°F | Ht 64.0 in | Wt 200.0 lb

## 2020-11-23 DIAGNOSIS — E66813 Obesity, class 3: Secondary | ICD-10-CM

## 2020-11-23 DIAGNOSIS — R7303 Prediabetes: Secondary | ICD-10-CM

## 2020-11-23 DIAGNOSIS — E559 Vitamin D deficiency, unspecified: Secondary | ICD-10-CM | POA: Diagnosis not present

## 2020-11-23 DIAGNOSIS — Z9189 Other specified personal risk factors, not elsewhere classified: Secondary | ICD-10-CM

## 2020-11-23 DIAGNOSIS — Z6841 Body Mass Index (BMI) 40.0 and over, adult: Secondary | ICD-10-CM

## 2020-11-23 MED ORDER — VITAMIN D-3 125 MCG (5000 UT) PO TABS: 1.0000 | ORAL_TABLET | Freq: Every day | ORAL | 0 refills | Status: DC

## 2020-11-28 ENCOUNTER — Other Ambulatory Visit (INDEPENDENT_AMBULATORY_CARE_PROVIDER_SITE_OTHER): Payer: Self-pay | Admitting: Family Medicine

## 2020-11-28 DIAGNOSIS — E559 Vitamin D deficiency, unspecified: Secondary | ICD-10-CM

## 2020-11-28 NOTE — Telephone Encounter (Signed)
Dr.Ukleja 

## 2020-11-28 NOTE — Progress Notes (Signed)
Chief Complaint:   OBESITY Catherine Christian is here to discuss her progress with her obesity treatment plan along with follow-up of her obesity related diagnoses. Catherine Christian is on the Category 4 Plan and states she is following her eating plan approximately 85% of the time. Catherine Christian states she is doing [employer] 20-30 minutes 6 times per week.  Today's visit was #: 8 Starting weight: 236 lbs Starting date: 06/26/2020 Today's weight: 200 lbs Today's date: 11/23/2020 Total lbs lost to date: 36 lbs Total lbs lost since last in-office visit: 3 lbs  Interim History: Pt had a familial birthday celebration and ribbon cutting celebration in the past few weeks. She has been doing typical category 4 breakfast with some substitutions of wraps or grits. She is getting in all food with occasional hunger mid afternoon. For snacks, she's doing beef stick or beef jerky. She is doing 100 morning meltdown #38 workout.  Subjective:   1. Vitamin D deficiency Pt reports fatigue. She is not on prescription Vit D, as she is on Vit D 5,000 IU daily.  2. Pre-diabetes Pt's recent A1c 5.5 and insulin level 7.0. She is not on medication.  Lab Results  Component Value Date   HGBA1C 5.5 11/02/2020   Lab Results  Component Value Date   INSULIN 7.0 11/02/2020   INSULIN 14.3 06/26/2020    3. At risk for osteoporosis Catherine Christian is at higher risk of osteopenia and osteoporosis due to Vitamin D deficiency.   Assessment/Plan:   1. Vitamin D deficiency Low Vitamin D level contributes to fatigue and are associated with obesity, breast, and colon cancer. She agrees to continue to take Vitamin D @5 ,000 IU daily and will follow-up for routine testing of Vitamin D, at least 2-3 times per year to avoid over-replacement.  - Cholecalciferol (VITAMIN D-3) 125 MCG (5000 UT) TABS; Take 1 tablet by mouth daily.  Dispense: 30 tablet; Refill: 0  2. Pre-diabetes Catherine Christian will continue to work on weight loss, exercise, and decreasing  simple carbohydrates to help decrease the risk of diabetes. Repeat lab in 3-4 months.  3. At risk for osteoporosis Catherine Christian was given approximately 15 minutes of osteoporosis prevention counseling today. Catherine Christian is at risk for osteopenia and osteoporosis due to her Vitamin D deficiency. She was encouraged to take her Vitamin D and follow her higher calcium diet and increase strengthening exercise to help strengthen her bones and decrease her risk of osteopenia and osteoporosis.  Repetitive spaced learning was employed today to elicit superior memory formation and behavioral change.  4. Class 3 severe obesity with serious comorbidity and body mass index (BMI) of 40.0 to 44.9 in adult, unspecified obesity type (HCC) Catherine Christian is currently in the action stage of change. As such, her goal is to continue with weight loss efforts. She has agreed to the Category 4 Plan.   Exercise goals: As is  Behavioral modification strategies: increasing lean protein intake, meal planning and cooking strategies and keeping healthy foods in the home.  Catherine Christian has agreed to follow-up with our clinic in 2-3 weeks. She was informed of the importance of frequent follow-up visits to maximize her success with intensive lifestyle modifications for her multiple health conditions.   Objective:   Blood pressure 98/66, pulse 82, temperature 99 F (37.2 C), temperature source Oral, height 5\' 4"  (1.626 m), weight 200 lb (90.7 kg), SpO2 96 %, currently breastfeeding. Body mass index is 34.33 kg/m.  General: Cooperative, alert, well developed, in no acute distress. HEENT: Conjunctivae and  lids unremarkable. Cardiovascular: Regular rhythm.  Lungs: Normal work of breathing. Neurologic: No focal deficits.   Lab Results  Component Value Date   CREATININE 0.71 11/02/2020   BUN 19 11/02/2020   NA 142 11/02/2020   K 4.6 11/02/2020   CL 105 11/02/2020   CO2 19 (L) 11/02/2020   Lab Results  Component Value Date   ALT 27  11/02/2020   AST 17 11/02/2020   ALKPHOS 74 11/02/2020   BILITOT 0.5 11/02/2020   Lab Results  Component Value Date   HGBA1C 5.5 11/02/2020   Lab Results  Component Value Date   INSULIN 7.0 11/02/2020   INSULIN 14.3 06/26/2020   Lab Results  Component Value Date   TSH 1.650 06/26/2020   Lab Results  Component Value Date   CHOL 159 06/26/2020   HDL 63 06/26/2020   LDLCALC 81 06/26/2020   TRIG 77 06/26/2020   Lab Results  Component Value Date   WBC 6.7 06/26/2020   HGB 14.2 06/26/2020   HCT 43.7 06/26/2020   MCV 87 06/26/2020   PLT 267 06/26/2020    Attestation Statements:   Reviewed by clinician on day of visit: allergies, medications, problem list, medical history, surgical history, family history, social history, and previous encounter notes.  Edmund Hilda, am acting as transcriptionist for Reuben Likes, MD.   I have reviewed the above documentation for accuracy and completeness, and I agree with the above. - Katherina Mires, MD

## 2020-12-07 ENCOUNTER — Other Ambulatory Visit: Payer: Self-pay

## 2020-12-07 ENCOUNTER — Encounter (INDEPENDENT_AMBULATORY_CARE_PROVIDER_SITE_OTHER): Payer: Self-pay | Admitting: Family Medicine

## 2020-12-07 ENCOUNTER — Ambulatory Visit (INDEPENDENT_AMBULATORY_CARE_PROVIDER_SITE_OTHER): Payer: 59 | Admitting: Family Medicine

## 2020-12-07 VITALS — BP 101/69 | HR 86 | Temp 98.4°F | Ht 64.0 in | Wt 198.0 lb

## 2020-12-07 DIAGNOSIS — J3089 Other allergic rhinitis: Secondary | ICD-10-CM

## 2020-12-07 DIAGNOSIS — Z6841 Body Mass Index (BMI) 40.0 and over, adult: Secondary | ICD-10-CM | POA: Diagnosis not present

## 2020-12-12 NOTE — Progress Notes (Signed)
Chief Complaint:   OBESITY Catherine Christian is here to discuss her progress with her obesity treatment plan along with follow-up of her obesity related diagnoses. Catherine Christian is on the Category 4 Plan and states she is following her eating plan approximately 85% of the time. Catherine Christian states she is doing [employer] 20-30 minutes 4 times per week.  Today's visit was #: 9 Starting weight: 236 lbs Starting date: 06/26/2020 Today's weight: 198 lbs Today's date: 12/07/2020 Total lbs lost to date: 38 Total lbs lost since last in-office visit: 2  Interim History: Catherine Christian is feeling great now that she is under 200 lbs. Her son has been waking up 2 times a night, so her exercise has decreased. She is going out of town for a friend's son's first birthday. Pt is on day 46 or 48 of morning meltdown. She has noticed days she works out, she's getting hungry 2 hours after working out.  Subjective:   1. Non-seasonal allergic rhinitis due to other allergic trigger Catherine Christian is on Zyrtec and Flonase. She is still experiencing symptoms.  Assessment/Plan:   1. Non-seasonal allergic rhinitis due to other allergic trigger Continue Zyrtec and Flonase. Follow up with symptom control at next appointment.  2. Obesity with current BMI of 34 Catherine Christian is currently in the action stage of change. As such, her goal is to continue with weight loss efforts. She has agreed to the Category 4 Plan + 100-200 calories.   Exercise goals: As is  Behavioral modification strategies: increasing lean protein intake, meal planning and cooking strategies, keeping healthy foods in the home and planning for success.  Catherine Christian has agreed to follow-up with our clinic in 2-3 weeks. She was informed of the importance of frequent follow-up visits to maximize her success with intensive lifestyle modifications for her multiple health conditions.   Objective:   Blood pressure 101/69, pulse 86, temperature 98.4 F (36.9 C), temperature source Oral,  height 5\' 4"  (1.626 m), weight 198 lb (89.8 kg), SpO2 97 %, currently breastfeeding. Body mass index is 33.99 kg/m.  General: Cooperative, alert, well developed, in no acute distress. HEENT: Conjunctivae and lids unremarkable. Cardiovascular: Regular rhythm.  Lungs: Normal work of breathing. Neurologic: No focal deficits.   Lab Results  Component Value Date   CREATININE 0.71 11/02/2020   BUN 19 11/02/2020   NA 142 11/02/2020   K 4.6 11/02/2020   CL 105 11/02/2020   CO2 19 (L) 11/02/2020   Lab Results  Component Value Date   ALT 27 11/02/2020   AST 17 11/02/2020   ALKPHOS 74 11/02/2020   BILITOT 0.5 11/02/2020   Lab Results  Component Value Date   HGBA1C 5.5 11/02/2020   Lab Results  Component Value Date   INSULIN 7.0 11/02/2020   INSULIN 14.3 06/26/2020   Lab Results  Component Value Date   TSH 1.650 06/26/2020   Lab Results  Component Value Date   CHOL 159 06/26/2020   HDL 63 06/26/2020   LDLCALC 81 06/26/2020   TRIG 77 06/26/2020   Lab Results  Component Value Date   WBC 6.7 06/26/2020   HGB 14.2 06/26/2020   HCT 43.7 06/26/2020   MCV 87 06/26/2020   PLT 267 06/26/2020    Attestation Statements:   Reviewed by clinician on day of visit: allergies, medications, problem list, medical history, surgical history, family history, social history, and previous encounter notes.  Time spent on visit including pre-visit chart review and post-visit care and charting was 16 minutes.  Coral Ceo, am acting as transcriptionist for Coralie Common, MD.   I have reviewed the above documentation for accuracy and completeness, and I agree with the above. - Jinny Blossom, MD

## 2020-12-30 ENCOUNTER — Other Ambulatory Visit (INDEPENDENT_AMBULATORY_CARE_PROVIDER_SITE_OTHER): Payer: Self-pay | Admitting: Family Medicine

## 2020-12-30 DIAGNOSIS — E559 Vitamin D deficiency, unspecified: Secondary | ICD-10-CM

## 2020-12-31 ENCOUNTER — Encounter (INDEPENDENT_AMBULATORY_CARE_PROVIDER_SITE_OTHER): Payer: Self-pay | Admitting: Family Medicine

## 2021-01-01 ENCOUNTER — Ambulatory Visit (INDEPENDENT_AMBULATORY_CARE_PROVIDER_SITE_OTHER): Payer: 59 | Admitting: Family Medicine

## 2021-01-01 ENCOUNTER — Other Ambulatory Visit (INDEPENDENT_AMBULATORY_CARE_PROVIDER_SITE_OTHER): Payer: Self-pay | Admitting: Family Medicine

## 2021-01-01 DIAGNOSIS — E559 Vitamin D deficiency, unspecified: Secondary | ICD-10-CM

## 2021-01-01 NOTE — Telephone Encounter (Signed)
Dr.Ukleja 

## 2021-01-10 ENCOUNTER — Ambulatory Visit (INDEPENDENT_AMBULATORY_CARE_PROVIDER_SITE_OTHER): Payer: 59 | Admitting: Family Medicine

## 2021-01-10 ENCOUNTER — Encounter (INDEPENDENT_AMBULATORY_CARE_PROVIDER_SITE_OTHER): Payer: Self-pay | Admitting: Family Medicine

## 2021-01-10 ENCOUNTER — Other Ambulatory Visit: Payer: Self-pay

## 2021-01-10 VITALS — BP 119/82 | HR 90 | Temp 98.2°F | Ht 64.0 in | Wt 199.0 lb

## 2021-01-10 DIAGNOSIS — E559 Vitamin D deficiency, unspecified: Secondary | ICD-10-CM

## 2021-01-10 DIAGNOSIS — Z9189 Other specified personal risk factors, not elsewhere classified: Secondary | ICD-10-CM | POA: Diagnosis not present

## 2021-01-10 DIAGNOSIS — Z6838 Body mass index (BMI) 38.0-38.9, adult: Secondary | ICD-10-CM

## 2021-01-10 DIAGNOSIS — R7401 Elevation of levels of liver transaminase levels: Secondary | ICD-10-CM

## 2021-01-10 MED ORDER — VITAMIN D-3 125 MCG (5000 UT) PO TABS: 1.0000 | ORAL_TABLET | Freq: Every day | ORAL | 0 refills | Status: DC

## 2021-01-11 NOTE — Progress Notes (Signed)
Chief Complaint:   OBESITY Catherine Christian is here to discuss her progress with her obesity treatment plan along with follow-up of her obesity related diagnoses. Catherine Christian is on the Category 4 Plan and 100-200 calories and states she is following her eating plan approximately 40-50% of the time. Catherine Christian states she is not currently exercising.  Today's visit was #: 10 Starting weight: 236 lbs Starting date: 06/26/2020 Today's weight: 199 lbs Today's date: 01/10/2021 Total lbs lost to date: 37 lbs Total lbs lost since last in-office visit: 0  Interim History: Pt voices she hasn't been exercising due to motivation and then back pain. She was put on steroid pack for back pain. Back pain has improved. Pt wants to get back on plan and be more strict with controlling snack calories.   Subjective:   1. Vitamin D deficiency Pt denies nausea, vomiting, and muscle weakness but notes fatigue. Her last Vit D level was 50.6.    2. Transaminitis ALT 27 at last check (improved from 40) and AST 17.  3. At risk for osteoporosis Catherine Christian is at higher risk of osteopenia and osteoporosis due to Vitamin D deficiency.   Assessment/Plan:   1. Vitamin D deficiency Low Vitamin D level contributes to fatigue and are associated with obesity, breast, and colon cancer. She agrees to continue to take Vitamin D @5 ,000 IU daily and will follow-up for routine testing of Vitamin D, at least 2-3 times per year to avoid over-replacement.  - Cholecalciferol (VITAMIN D-3) 125 MCG (5000 UT) TABS; Take 1 tablet by mouth daily.  Dispense: 30 tablet; Refill: 0  2. Transaminitis Repeat labs in July 2022.  3. At risk for osteoporosis Catherine Christian was given approximately 15 minutes of osteoporosis prevention counseling today. Catherine Christian is at risk for osteopenia and osteoporosis due to her Vitamin D deficiency. She was encouraged to take her Vitamin D and follow her higher calcium diet and increase strengthening exercise to help  strengthen her bones and decrease her risk of osteopenia and osteoporosis.  Repetitive spaced learning was employed today to elicit superior memory formation and behavioral change.  4. Class 2 severe obesity with serious comorbidity and body mass index (BMI) of 38.0 to 38.9 in adult, unspecified obesity type (HCC) Catherine Christian is currently in the action stage of change. As such, her goal is to continue with weight loss efforts. She has agreed to the Category 4 Plan.   Exercise goals: All adults should avoid inactivity. Some physical activity is better than none, and adults who participate in any amount of physical activity gain some health benefits.  Behavioral modification strategies: increasing lean protein intake, meal planning and cooking strategies, keeping healthy foods in the home and planning for success.  Catherine Christian has agreed to follow-up with our clinic in 3-4 weeks. She was informed of the importance of frequent follow-up visits to maximize her success with intensive lifestyle modifications for her multiple health conditions.   Objective:   Blood pressure 119/82, pulse 90, temperature 98.2 F (36.8 C), height 5\' 4"  (1.626 m), weight 199 lb (90.3 kg), SpO2 98 %, currently breastfeeding. Body mass index is 34.16 kg/m.  General: Cooperative, alert, well developed, in no acute distress. HEENT: Conjunctivae and lids unremarkable. Cardiovascular: Regular rhythm.  Lungs: Normal work of breathing. Neurologic: No focal deficits.   Lab Results  Component Value Date   CREATININE 0.71 11/02/2020   BUN 19 11/02/2020   NA 142 11/02/2020   K 4.6 11/02/2020   CL 105 11/02/2020  CO2 19 (L) 11/02/2020   Lab Results  Component Value Date   ALT 27 11/02/2020   AST 17 11/02/2020   ALKPHOS 74 11/02/2020   BILITOT 0.5 11/02/2020   Lab Results  Component Value Date   HGBA1C 5.5 11/02/2020   Lab Results  Component Value Date   INSULIN 7.0 11/02/2020   INSULIN 14.3 06/26/2020   Lab  Results  Component Value Date   TSH 1.650 06/26/2020   Lab Results  Component Value Date   CHOL 159 06/26/2020   HDL 63 06/26/2020   LDLCALC 81 06/26/2020   TRIG 77 06/26/2020   Lab Results  Component Value Date   WBC 6.7 06/26/2020   HGB 14.2 06/26/2020   HCT 43.7 06/26/2020   MCV 87 06/26/2020   PLT 267 06/26/2020    Attestation Statements:   Reviewed by clinician on day of visit: allergies, medications, problem list, medical history, surgical history, family history, social history, and previous encounter notes.  Catherine Christian, am acting as transcriptionist for Reuben Likes, MD.   I have reviewed the above documentation for accuracy and completeness, and I agree with the above. - Catherine Mires, MD

## 2021-02-01 ENCOUNTER — Other Ambulatory Visit (INDEPENDENT_AMBULATORY_CARE_PROVIDER_SITE_OTHER): Payer: Self-pay | Admitting: Family Medicine

## 2021-02-01 ENCOUNTER — Encounter (INDEPENDENT_AMBULATORY_CARE_PROVIDER_SITE_OTHER): Payer: Self-pay | Admitting: Family Medicine

## 2021-02-01 ENCOUNTER — Ambulatory Visit (INDEPENDENT_AMBULATORY_CARE_PROVIDER_SITE_OTHER): Payer: 59 | Admitting: Family Medicine

## 2021-02-01 ENCOUNTER — Other Ambulatory Visit: Payer: Self-pay

## 2021-02-01 VITALS — BP 105/73 | HR 86 | Temp 98.2°F | Ht 64.0 in | Wt 199.0 lb

## 2021-02-01 DIAGNOSIS — Z6838 Body mass index (BMI) 38.0-38.9, adult: Secondary | ICD-10-CM

## 2021-02-01 DIAGNOSIS — J301 Allergic rhinitis due to pollen: Secondary | ICD-10-CM

## 2021-02-01 DIAGNOSIS — E559 Vitamin D deficiency, unspecified: Secondary | ICD-10-CM | POA: Diagnosis not present

## 2021-02-01 MED ORDER — VITAMIN D-3 125 MCG (5000 UT) PO TABS: 1.0000 | ORAL_TABLET | Freq: Every day | ORAL | 0 refills | Status: AC

## 2021-02-05 NOTE — Progress Notes (Signed)
Chief Complaint:   OBESITY Catherine Christian is here to discuss her progress with her obesity treatment plan along with follow-up of her obesity related diagnoses. Catherine Christian is on the Category 4 Plan and states she is following her eating plan approximately 70-80% of the time. Catherine Christian states she is walking 30 minutes 3 times per week.  Today's visit was #: 11 Starting weight: 236 lbs Starting date: 06/26/2020 Today's weight: 199 lbs Today's date: 02/01/2021 Total lbs lost to date: 37 Total lbs lost since last in-office visit: 0  Interim History: Since her last appt, Promiss started walking more outside. She is going to visit her father and his wife for [facility] Day weekend. Food wise, pt is ready for a change. She is looking for particularly a change at dinner.  Subjective:   1. Vitamin D deficiency Catherine Christian is on OTC Vit D, as she is still breastfeeding. Her last Vit D level 50.6. Pt denies nausea, vomiting, and muscle weakness but notes fatigue.   2. Seasonal allergic rhinitis due to pollen Catherine Christian is on Flonase and Zyrtec. Symptoms for the past few days have been better than previous few days.   Assessment/Plan:   1. Vitamin D deficiency Low Vitamin D level contributes to fatigue and are associated with obesity, breast, and colon cancer. She agrees to continue to take OTC Vitamin D @5 ,000 IU daily and will follow-up for routine testing of Vitamin D, at least 2-3 times per year to avoid over-replacement.  - Cholecalciferol (VITAMIN D-3) 125 MCG (5000 UT) TABS; Take 1 tablet by mouth daily.  Dispense: 90 tablet; Refill: 0  2. Seasonal allergic rhinitis due to pollen Continue Flonase and Zyrtec.  3. Class 2 severe obesity with serious comorbidity and body mass index (BMI) of 38.0 to 38.9 in adult, unspecified obesity type (HCC) Catherine Christian is currently in the action stage of change. As such, her goal is to continue with weight loss efforts. She has agreed to the Category 4 Plan and keeping a  food journal and adhering to recommended goals of 550-700 calories and 50+ g protein with supper.   Exercise goals: As is  Behavioral modification strategies: increasing lean protein intake, meal planning and cooking strategies, keeping healthy foods in the home and planning for success.  Reita has agreed to follow-up with our clinic in 4 weeks. She was informed of the importance of frequent follow-up visits to maximize her success with intensive lifestyle modifications for her multiple health conditions.   Objective:   Blood pressure 105/73, pulse 86, temperature 98.2 F (36.8 C), height 5\' 4"  (1.626 m), weight 199 lb (90.3 kg), SpO2 98 %, currently breastfeeding. Body mass index is 34.16 kg/m.  General: Cooperative, alert, well developed, in no acute distress. HEENT: Conjunctivae and lids unremarkable. Cardiovascular: Regular rhythm.  Lungs: Normal work of breathing. Neurologic: No focal deficits.   Lab Results  Component Value Date   CREATININE 0.71 11/02/2020   BUN 19 11/02/2020   NA 142 11/02/2020   K 4.6 11/02/2020   CL 105 11/02/2020   CO2 19 (L) 11/02/2020   Lab Results  Component Value Date   ALT 27 11/02/2020   AST 17 11/02/2020   ALKPHOS 74 11/02/2020   BILITOT 0.5 11/02/2020   Lab Results  Component Value Date   HGBA1C 5.5 11/02/2020   Lab Results  Component Value Date   INSULIN 7.0 11/02/2020   INSULIN 14.3 06/26/2020   Lab Results  Component Value Date   TSH 1.650 06/26/2020  Lab Results  Component Value Date   CHOL 159 06/26/2020   HDL 63 06/26/2020   LDLCALC 81 06/26/2020   TRIG 77 06/26/2020   Lab Results  Component Value Date   WBC 6.7 06/26/2020   HGB 14.2 06/26/2020   HCT 43.7 06/26/2020   MCV 87 06/26/2020   PLT 267 06/26/2020   No results found for: IRON, TIBC, FERRITIN   Attestation Statements:   Reviewed by clinician on day of visit: allergies, medications, problem list, medical history, surgical history, family history,  social history, and previous encounter notes.  Edmund Hilda, CMA, am acting as transcriptionist for Reuben Likes, MD.   I have reviewed the above documentation for accuracy and completeness, and I agree with the above. - Katherina Mires, MD

## 2021-03-05 ENCOUNTER — Other Ambulatory Visit: Payer: Self-pay

## 2021-03-05 ENCOUNTER — Ambulatory Visit (INDEPENDENT_AMBULATORY_CARE_PROVIDER_SITE_OTHER): Payer: 59 | Admitting: Family Medicine

## 2021-03-05 ENCOUNTER — Encounter (INDEPENDENT_AMBULATORY_CARE_PROVIDER_SITE_OTHER): Payer: Self-pay | Admitting: Family Medicine

## 2021-03-05 VITALS — BP 120/89 | HR 99 | Temp 98.7°F | Ht 64.0 in | Wt 203.0 lb

## 2021-03-05 DIAGNOSIS — Z6838 Body mass index (BMI) 38.0-38.9, adult: Secondary | ICD-10-CM

## 2021-03-05 DIAGNOSIS — E559 Vitamin D deficiency, unspecified: Secondary | ICD-10-CM

## 2021-03-05 DIAGNOSIS — R7303 Prediabetes: Secondary | ICD-10-CM

## 2021-03-06 NOTE — Progress Notes (Signed)
Chief Complaint:   OBESITY Catherine Christian is here to discuss her progress with her obesity treatment plan along with follow-up of her obesity related diagnoses. Catherine Christian is on the Category 4 Plan with dinner modifications and states she is following her eating plan approximately 60% of the time. Catherine Christian states she is walking 45 minutes 1-2 times per week.  Today's visit was #: 12 Starting weight: 236 lbs Starting date: 06/26/2020 Today's weight: 203 lbs Today's date: 03/05/2021 Total lbs lost to date: 33 Total lbs lost since last in-office visit: 0  Interim History: Catherine Christian voices she's been doing more and been busy with going different places. She just returned from a trip to see her dad. She has a reunion this weekend where she will likely eat out more. July is very busy with excursions almost every weekend.  Subjective:   1. Pre-diabetes Catherine Christian's last A1c was 5.5 and insulin level 7.0.   2. Vitamin D deficiency Catherine Christian denies nausea, vomiting, and muscle weakness but notes fatigue. Pt is on OTC Vit D.  Assessment/Plan:   1. Pre-diabetes Catherine Christian will continue to work on weight loss, exercise, and decreasing simple carbohydrates to help decrease the risk of diabetes. Continue Category 4 or journaling.  2. Vitamin D deficiency Low Vitamin D level contributes to fatigue and are associated with obesity, breast, and colon cancer. She agrees to continue to take OTC  Vitamin D daily and will follow-up for routine testing of Vitamin D, at least 2-3 times per year to avoid over-replacement.  3. Class 2 severe obesity with serious comorbidity and body mass index (BMI) of 38.0 to 38.9 in adult, unspecified obesity type (HCC)  Catherine Christian is currently in the action stage of change. As such, her goal is to continue with weight loss efforts. She has agreed to the Category 4 Plan and keeping a food journal and adhering to recommended goals of 1750-1900 calories and 125+ g protein.   Exercise goals:  All adults should avoid inactivity. Some physical activity is better than none, and adults who participate in any amount of physical activity gain some health benefits.  Behavioral modification strategies: increasing lean protein intake, meal planning and cooking strategies, keeping healthy foods in the home, and planning for success.  Catherine Christian has agreed to follow-up with our clinic in 3-4 weeks. She was informed of the importance of frequent follow-up visits to maximize her success with intensive lifestyle modifications for her multiple health conditions.   Objective:   Blood pressure 120/89, pulse 99, temperature 98.7 F (37.1 C), height 5\' 4"  (1.626 m), weight 203 lb (92.1 kg), SpO2 97 %, currently breastfeeding. Body mass index is 34.84 kg/m.  General: Cooperative, alert, well developed, in no acute distress. HEENT: Conjunctivae and lids unremarkable. Cardiovascular: Regular rhythm.  Lungs: Normal work of breathing. Neurologic: No focal deficits.   Lab Results  Component Value Date   CREATININE 0.71 11/02/2020   BUN 19 11/02/2020   NA 142 11/02/2020   K 4.6 11/02/2020   CL 105 11/02/2020   CO2 19 (L) 11/02/2020   Lab Results  Component Value Date   ALT 27 11/02/2020   AST 17 11/02/2020   ALKPHOS 74 11/02/2020   BILITOT 0.5 11/02/2020   Lab Results  Component Value Date   HGBA1C 5.5 11/02/2020   Lab Results  Component Value Date   INSULIN 7.0 11/02/2020   INSULIN 14.3 06/26/2020   Lab Results  Component Value Date   TSH 1.650 06/26/2020   Lab Results  Component Value Date   CHOL 159 06/26/2020   HDL 63 06/26/2020   LDLCALC 81 06/26/2020   TRIG 77 06/26/2020   Lab Results  Component Value Date   WBC 6.7 06/26/2020   HGB 14.2 06/26/2020   HCT 43.7 06/26/2020   MCV 87 06/26/2020   PLT 267 06/26/2020   No results found for: IRON, TIBC, FERRITIN   Attestation Statements:   Reviewed by clinician on day of visit: allergies, medications, problem list,  medical history, surgical history, family history, social history, and previous encounter notes.  Edmund Hilda, CMA, am acting as transcriptionist for Reuben Likes, MD.  I have reviewed the above documentation for accuracy and completeness, and I agree with the above. - Katherina Mires, MD

## 2021-03-29 ENCOUNTER — Encounter (INDEPENDENT_AMBULATORY_CARE_PROVIDER_SITE_OTHER): Payer: Self-pay | Admitting: Family Medicine

## 2021-03-29 ENCOUNTER — Ambulatory Visit (INDEPENDENT_AMBULATORY_CARE_PROVIDER_SITE_OTHER): Payer: 59 | Admitting: Family Medicine

## 2021-03-29 ENCOUNTER — Other Ambulatory Visit: Payer: Self-pay

## 2021-03-29 VITALS — BP 109/72 | HR 93 | Temp 98.4°F | Ht 64.0 in | Wt 201.0 lb

## 2021-03-29 DIAGNOSIS — Z6838 Body mass index (BMI) 38.0-38.9, adult: Secondary | ICD-10-CM

## 2021-03-29 DIAGNOSIS — R110 Nausea: Secondary | ICD-10-CM

## 2021-03-29 DIAGNOSIS — E559 Vitamin D deficiency, unspecified: Secondary | ICD-10-CM

## 2021-03-29 DIAGNOSIS — Z9189 Other specified personal risk factors, not elsewhere classified: Secondary | ICD-10-CM | POA: Diagnosis not present

## 2021-03-29 MED ORDER — ONDANSETRON 4 MG PO TBDP: 4.0000 mg | ORAL_TABLET | Freq: Three times a day (TID) | ORAL | 0 refills | Status: DC | PRN

## 2021-04-05 NOTE — Progress Notes (Signed)
Chief Complaint:   OBESITY Catherine Christian is here to discuss her progress with her obesity treatment plan along with follow-up of her obesity related diagnoses. Catherine Christian is on the Category 4 Plan and keeping a food journal and adhering to recommended goals of 1700-1900 calories and 125 g protein and states she is following her eating plan approximately 90% of the time. Catherine Christian states she is doing [employer] and treadmill 35-45 minutes 3 times per week.  Today's visit was #: 13 Starting weight: 236 lbs Starting date: 06/26/2020 Today's weight: 201 lbs Today's date: 03/29/2021 Total lbs lost to date: 35 Total lbs lost since last in-office visit: 2  Interim History: Catherine Christian had a birthday celebration over the last few weeks. She went to [employer] with friends. She has her son's birthday party scheduled next week and a conference for church this weekend. She is doing a combo of category 4 and journaling.  Subjective:   1. Vitamin D deficiency Pt denies nausea, vomiting, and muscle weakness but notes fatigue. She is on OTC Vit D. 5,000 IU daily  2. Nausea Catherine Christian reports fairly consistent nausea in the last 2 weeks. First time in the morning and occasionally in the evenings. Once nausea dissipates, pt reports feeling ravenous.  3. At risk for adverse drug event Catherine Christian is at risk for side effects of medication.  Assessment/Plan:   1. Vitamin D deficiency Low Vitamin D level contributes to fatigue and are associated with obesity, breast, and colon cancer. She agrees to continue to take OTC Vitamin D 5,000 IU daily and will follow-up for routine testing of Vitamin D, at least 2-3 times per year to avoid over-replacement.  2. Nausea Use Zofran 4 mg PRN nausea.  - ondansetron (ZOFRAN ODT) 4 MG disintegrating tablet; Take 1 tablet (4 mg total) by mouth every 8 (eight) hours as needed for nausea or vomiting.  Dispense: 30 tablet; Refill: 0  3. At risk for adverse drug event Catherine Christian  was given approximately 15 minutes of drug side effect counseling today.  We discussed side effect possibility and risk versus benefits. Catherine Christian agreed to the medication and will contact this office if these side effects are intolerable.  Repetitive spaced learning was employed today to elicit superior memory formation and behavioral change.   4. Obesity Current BMI 34.6  Catherine Christian is currently in the action stage of change. As such, her goal is to continue with weight loss efforts. She has agreed to the Category 4 Plan and keeping a food journal and adhering to recommended goals of 1700-1900 calories and 125+ g protein.   Exercise goals:  As is  Behavioral modification strategies: increasing lean protein intake, meal planning and cooking strategies, keeping healthy foods in the home, and planning for success.  Catherine Christian has agreed to follow-up with our clinic in 3 weeks- fasting and repeat IC. She was informed of the importance of frequent follow-up visits to maximize her success with intensive lifestyle modifications for her multiple health conditions.   Objective:   Blood pressure 109/72, pulse 93, temperature 98.4 F (36.9 C), height 5\' 4"  (1.626 m), weight 201 lb (91.2 kg), SpO2 99 %, currently breastfeeding. Body mass index is 34.5 kg/m.  General: Cooperative, alert, well developed, in no acute distress. HEENT: Conjunctivae and lids unremarkable. Cardiovascular: Regular rhythm.  Lungs: Normal work of breathing. Neurologic: No focal deficits.   Lab Results  Component Value Date   CREATININE 0.71 11/02/2020   BUN 19 11/02/2020   NA  142 11/02/2020   K 4.6 11/02/2020   CL 105 11/02/2020   CO2 19 (L) 11/02/2020   Lab Results  Component Value Date   ALT 27 11/02/2020   AST 17 11/02/2020   ALKPHOS 74 11/02/2020   BILITOT 0.5 11/02/2020   Lab Results  Component Value Date   HGBA1C 5.5 11/02/2020   Lab Results  Component Value Date   INSULIN 7.0 11/02/2020   INSULIN 14.3  06/26/2020   Lab Results  Component Value Date   TSH 1.650 06/26/2020   Lab Results  Component Value Date   CHOL 159 06/26/2020   HDL 63 06/26/2020   LDLCALC 81 06/26/2020   TRIG 77 06/26/2020   Lab Results  Component Value Date   VD25OH 50.6 11/02/2020   VD25OH 30.3 06/26/2020   Lab Results  Component Value Date   WBC 6.7 06/26/2020   HGB 14.2 06/26/2020   HCT 43.7 06/26/2020   MCV 87 06/26/2020   PLT 267 06/26/2020   No results found for: IRON, TIBC, FERRITIN   Attestation Statements:   Reviewed by clinician on day of visit: allergies, medications, problem list, medical history, surgical history, family history, social history, and previous encounter notes.  Edmund Hilda, CMA, am acting as transcriptionist for Reuben Likes, MD.   I have reviewed the above documentation for accuracy and completeness, and I agree with the above. - Reuben Likes, MD

## 2021-04-30 ENCOUNTER — Ambulatory Visit (INDEPENDENT_AMBULATORY_CARE_PROVIDER_SITE_OTHER): Payer: 59 | Admitting: Family Medicine

## 2021-04-30 ENCOUNTER — Encounter (INDEPENDENT_AMBULATORY_CARE_PROVIDER_SITE_OTHER): Payer: Self-pay | Admitting: Family Medicine

## 2021-04-30 ENCOUNTER — Other Ambulatory Visit: Payer: Self-pay

## 2021-04-30 VITALS — BP 102/68 | HR 69 | Temp 98.1°F | Ht 64.0 in | Wt 206.0 lb

## 2021-04-30 DIAGNOSIS — Z9189 Other specified personal risk factors, not elsewhere classified: Secondary | ICD-10-CM | POA: Diagnosis not present

## 2021-04-30 DIAGNOSIS — R7303 Prediabetes: Secondary | ICD-10-CM

## 2021-04-30 DIAGNOSIS — Z6838 Body mass index (BMI) 38.0-38.9, adult: Secondary | ICD-10-CM

## 2021-04-30 DIAGNOSIS — E559 Vitamin D deficiency, unspecified: Secondary | ICD-10-CM | POA: Diagnosis not present

## 2021-04-30 DIAGNOSIS — R0602 Shortness of breath: Secondary | ICD-10-CM

## 2021-05-01 LAB — COMPREHENSIVE METABOLIC PANEL
ALT: 25 IU/L (ref 0–32)
AST: 20 IU/L (ref 0–40)
Albumin/Globulin Ratio: 2.3 — ABNORMAL HIGH (ref 1.2–2.2)
Albumin: 4.5 g/dL (ref 3.9–5.0)
Alkaline Phosphatase: 49 IU/L (ref 44–121)
BUN/Creatinine Ratio: 32 — ABNORMAL HIGH (ref 9–23)
BUN: 19 mg/dL (ref 6–20)
Bilirubin Total: 0.5 mg/dL (ref 0.0–1.2)
CO2: 21 mmol/L (ref 20–29)
Calcium: 9.2 mg/dL (ref 8.7–10.2)
Chloride: 105 mmol/L (ref 96–106)
Creatinine, Ser: 0.59 mg/dL (ref 0.57–1.00)
Globulin, Total: 2 g/dL (ref 1.5–4.5)
Glucose: 104 mg/dL — ABNORMAL HIGH (ref 65–99)
Potassium: 4.4 mmol/L (ref 3.5–5.2)
Sodium: 141 mmol/L (ref 134–144)
Total Protein: 6.5 g/dL (ref 6.0–8.5)
eGFR: 124 mL/min/{1.73_m2} (ref >59)

## 2021-05-01 LAB — INSULIN, RANDOM: INSULIN: 11 u[IU]/mL (ref 2.6–24.9)

## 2021-05-01 LAB — VITAMIN D 25 HYDROXY (VIT D DEFICIENCY, FRACTURES): Vit D, 25-Hydroxy: 49.2 ng/mL (ref 30.0–100.0)

## 2021-05-01 LAB — HEMOGLOBIN A1C
Est. average glucose Bld gHb Est-mCnc: 108 mg/dL
Hgb A1c MFr Bld: 5.4 % (ref 4.8–5.6)

## 2021-05-01 NOTE — Progress Notes (Signed)
Chief Complaint:   OBESITY Catherine Christian is here to discuss her progress with her obesity treatment plan along with follow-up of her obesity related diagnoses. Catherine Christian is on the Category 4 Plan and keeping a food journal and adhering to recommended goals of 1700-1900 calories and 125 grams protein and states she is following her eating plan approximately 60% of the time. Catherine Christian states she is walking 30-50 minutes 3 times per week.  Today's visit was #: 14 Starting weight: 236 lbs Starting date: 06/26/2020 Today's weight: 206 lbs Today's date: 04/30/2021 Total lbs lost to date: 30 Total lbs lost since last in-office visit: 0  Interim History: Catherine Christian recently had COVID and felt not well over the last few weeks. Her husband and son both had COVID as well. Her son's birthday party was rescheduled for this coming weekend. She wants to have some structure over the next couple of weeks.  Subjective:   1. SOB (shortness of breath) on exertion Catherine Christian reports some increase in symptoms with recent bout with COVID.  2. Vitamin D deficiency Pt denies nausea, vomiting, and muscle weakness but notes fatigue. She is not on prescription Vit D.  3. Pre-diabetes Her last A1c was 5.5 and insulin level 7.0. She is not on medication.  4. At risk for diabetes mellitus Catherine Christian is at higher than average risk for developing diabetes due to obesity.   Assessment/Plan:   1. SOB (shortness of breath) on exertion Catherine Christian does feel that she gets out of breath more easily that she used to when she exercises. Catherine Christian's shortness of breath appears to be obesity related and exercise induced. She has agreed to work on weight loss and gradually increase exercise to treat her exercise induced shortness of breath. Will continue to monitor closely. Check IC today.  2. Vitamin D deficiency Low Vitamin D level contributes to fatigue and are associated with obesity, breast, and colon cancer. She agrees to follow-up for  routine testing of Vitamin D, at least 2-3 times per year to avoid over-replacement. Check labs today.  - VITAMIN D 25 Hydroxy (Vit-D Deficiency, Fractures)  3. Pre-diabetes Catherine Christian will continue to work on weight loss, exercise, and decreasing simple carbohydrates to help decrease the risk of diabetes.  Check labs today.  - Comprehensive metabolic panel - Hemoglobin A1c - Insulin, random  4. At risk for diabetes mellitus Catherine Christian was given approximately 15 minutes of diabetes education and counseling today. We discussed intensive lifestyle modifications today with an emphasis on weight loss as well as increasing exercise and decreasing simple carbohydrates in her diet. We also reviewed medication options with an emphasis on risk versus benefit of those discussed.   Repetitive spaced learning was employed today to elicit superior memory formation and behavioral change.  5. Obesity Current BMI of 35.4  Catherine Christian is currently in the action stage of change. As such, her goal is to continue with weight loss efforts. She has agreed to the Category 3 Plan and keeping a food journal and adhering to recommended goals of 1450-1550 calories and 90+ grams protein.   Exercise goals: All adults should avoid inactivity. Some physical activity is better than none, and adults who participate in any amount of physical activity gain some health benefits.  Behavioral modification strategies: increasing lean protein intake, meal planning and cooking strategies, keeping healthy foods in the home, and planning for success.  Catherine Christian has agreed to follow-up with our clinic in 3 weeks. She was informed of the importance of frequent follow-up  visits to maximize her success with intensive lifestyle modifications for her multiple health conditions.   Catherine Christian was informed we would discuss her lab results at her next visit unless there is a critical issue that needs to be addressed sooner. Catherine Christian agreed to keep her next  visit at the agreed upon time to discuss these results.  Objective:   Blood pressure 102/68, pulse 69, temperature 98.1 F (36.7 C), height 5\' 4"  (1.626 m), weight 206 lb (93.4 kg), last menstrual period 04/19/2021, SpO2 99 %, currently breastfeeding. Body mass index is 35.36 kg/m.  General: Cooperative, alert, well developed, in no acute distress. HEENT: Conjunctivae and lids unremarkable. Cardiovascular: Regular rhythm.  Lungs: Normal work of breathing. Neurologic: No focal deficits.   Lab Results  Component Value Date   CREATININE 0.59 04/30/2021   BUN 19 04/30/2021   NA 141 04/30/2021   K 4.4 04/30/2021   CL 105 04/30/2021   CO2 21 04/30/2021   Lab Results  Component Value Date   ALT 25 04/30/2021   AST 20 04/30/2021   ALKPHOS 49 04/30/2021   BILITOT 0.5 04/30/2021   Lab Results  Component Value Date   HGBA1C 5.4 04/30/2021   HGBA1C 5.5 11/02/2020   Lab Results  Component Value Date   INSULIN 11.0 04/30/2021   INSULIN 7.0 11/02/2020   INSULIN 14.3 06/26/2020   Lab Results  Component Value Date   TSH 1.650 06/26/2020   Lab Results  Component Value Date   CHOL 159 06/26/2020   HDL 63 06/26/2020   LDLCALC 81 06/26/2020   TRIG 77 06/26/2020   Lab Results  Component Value Date   VD25OH 49.2 04/30/2021   VD25OH 50.6 11/02/2020   VD25OH 30.3 06/26/2020   Lab Results  Component Value Date   WBC 6.7 06/26/2020   HGB 14.2 06/26/2020   HCT 43.7 06/26/2020   MCV 87 06/26/2020   PLT 267 06/26/2020    Attestation Statements:   Reviewed by clinician on day of visit: allergies, medications, problem list, medical history, surgical history, family history, social history, and previous encounter notes.  08/26/2020, CMA, am acting as transcriptionist for Edmund Hilda, MD.   I have reviewed the above documentation for accuracy and completeness, and I agree with the above. - Reuben Likes, MD

## 2021-05-23 ENCOUNTER — Ambulatory Visit (INDEPENDENT_AMBULATORY_CARE_PROVIDER_SITE_OTHER): Payer: 59 | Admitting: Family Medicine

## 2021-05-23 ENCOUNTER — Other Ambulatory Visit: Payer: Self-pay

## 2021-05-23 ENCOUNTER — Encounter (INDEPENDENT_AMBULATORY_CARE_PROVIDER_SITE_OTHER): Payer: Self-pay | Admitting: Family Medicine

## 2021-05-23 VITALS — BP 114/77 | HR 86 | Temp 98.6°F | Ht 64.0 in | Wt 201.0 lb

## 2021-05-23 DIAGNOSIS — E559 Vitamin D deficiency, unspecified: Secondary | ICD-10-CM | POA: Diagnosis not present

## 2021-05-23 DIAGNOSIS — Z6841 Body Mass Index (BMI) 40.0 and over, adult: Secondary | ICD-10-CM | POA: Diagnosis not present

## 2021-05-24 NOTE — Progress Notes (Signed)
Chief Complaint:   OBESITY Catherine Christian is here to discuss her progress with her obesity treatment plan along with follow-up of her obesity related diagnoses. Catherine Christian is on the Category 3 Plan and keeping a food journal and adhering to recommended goals of 1450-1550 calories and 90 grams protein and states she is following her eating plan approximately 70% of the time. Catherine Christian states she is walking or doing [employer] 30 minutes 2-3 times per week.  Today's visit was #: 15 Starting weight: 236 lbs Starting date: 06/26/2020 Today's weight: 201 lbs Today's date: 05/23/2021 Total lbs lost to date: 35 Total lbs lost since last in-office visit: 5  Interim History: Over the last few weeks, Catherine Christian has been helping her mom move and cleaning out her house. Her stress level last week was high and so she struggled to stay on plan. She has no plans in the upcoming few weeks. Pt was journaling even though she's following category 3.  Subjective:   1. Vitamin D deficiency Catherine Christian denies nausea, vomiting, and muscle weakness but notes fatigue. She is on prescription Vit D.  Assessment/Plan:   1. Vitamin D deficiency Low Vitamin D level contributes to fatigue and are associated with obesity, breast, and colon cancer. She agrees to continue to take prescription Vitamin D 50,000 IU every week and will follow-up for routine testing of Vitamin D, at least 2-3 times per year to avoid over-replacement.  2. Obesity with current BMI of 34.5  Catherine Christian is currently in the action stage of change. As such, her goal is to continue with weight loss efforts. She has agreed to the Category 3 Plan.   Exercise goals:  As is  Behavioral modification strategies: increasing lean protein intake, meal planning and cooking strategies, keeping healthy foods in the home, and travel eating strategies.  Catherine Christian has agreed to follow-up with our clinic in 3 weeks. She was informed of the importance of frequent follow-up visits  to maximize her success with intensive lifestyle modifications for her multiple health conditions.   Objective:   Blood pressure 114/77, pulse 86, temperature 98.6 F (37 C), height 5\' 4"  (1.626 m), weight 201 lb (91.2 kg), last menstrual period 05/17/2021, SpO2 98 %, currently breastfeeding. Body mass index is 34.5 kg/m.  General: Cooperative, alert, well developed, in no acute distress. HEENT: Conjunctivae and lids unremarkable. Cardiovascular: Regular rhythm.  Lungs: Normal work of breathing. Neurologic: No focal deficits.   Lab Results  Component Value Date   CREATININE 0.59 04/30/2021   BUN 19 04/30/2021   NA 141 04/30/2021   K 4.4 04/30/2021   CL 105 04/30/2021   CO2 21 04/30/2021   Lab Results  Component Value Date   ALT 25 04/30/2021   AST 20 04/30/2021   ALKPHOS 49 04/30/2021   BILITOT 0.5 04/30/2021   Lab Results  Component Value Date   HGBA1C 5.4 04/30/2021   HGBA1C 5.5 11/02/2020   Lab Results  Component Value Date   INSULIN 11.0 04/30/2021   INSULIN 7.0 11/02/2020   INSULIN 14.3 06/26/2020   Lab Results  Component Value Date   TSH 1.650 06/26/2020   Lab Results  Component Value Date   CHOL 159 06/26/2020   HDL 63 06/26/2020   LDLCALC 81 06/26/2020   TRIG 77 06/26/2020   Lab Results  Component Value Date   VD25OH 49.2 04/30/2021   VD25OH 50.6 11/02/2020   VD25OH 30.3 06/26/2020   Lab Results  Component Value Date   WBC 6.7  06/26/2020   HGB 14.2 06/26/2020   HCT 43.7 06/26/2020   MCV 87 06/26/2020   PLT 267 06/26/2020    Attestation Statements:   Reviewed by clinician on day of visit: allergies, medications, problem list, medical history, surgical history, family history, social history, and previous encounter notes.  Edmund Hilda, CMA, am acting as transcriptionist for Reuben Likes, MD.   I have reviewed the above documentation for accuracy and completeness, and I agree with the above. - Reuben Likes, MD

## 2021-06-04 ENCOUNTER — Encounter (INDEPENDENT_AMBULATORY_CARE_PROVIDER_SITE_OTHER): Payer: Self-pay

## 2021-06-13 ENCOUNTER — Ambulatory Visit (INDEPENDENT_AMBULATORY_CARE_PROVIDER_SITE_OTHER): Payer: 59 | Admitting: Family Medicine

## 2021-06-14 ENCOUNTER — Ambulatory Visit (INDEPENDENT_AMBULATORY_CARE_PROVIDER_SITE_OTHER): Payer: 59 | Admitting: Family Medicine

## 2021-06-14 ENCOUNTER — Other Ambulatory Visit: Payer: Self-pay

## 2021-06-14 ENCOUNTER — Encounter (INDEPENDENT_AMBULATORY_CARE_PROVIDER_SITE_OTHER): Payer: Self-pay | Admitting: Family Medicine

## 2021-06-14 VITALS — BP 100/68 | HR 79 | Temp 98.5°F | Ht 64.0 in | Wt 202.0 lb

## 2021-06-14 DIAGNOSIS — E559 Vitamin D deficiency, unspecified: Secondary | ICD-10-CM

## 2021-06-14 DIAGNOSIS — Z6841 Body Mass Index (BMI) 40.0 and over, adult: Secondary | ICD-10-CM

## 2021-06-14 DIAGNOSIS — E66813 Obesity, class 3: Secondary | ICD-10-CM

## 2021-06-14 NOTE — Progress Notes (Signed)
Chief Complaint:   OBESITY Catherine Christian is here to discuss her progress with her obesity treatment plan along with follow-up of her obesity related diagnoses. Catherine Christian is on the Category 3 Plan and states she is following her eating plan approximately 60% of the time. Catherine Christian states she walked 60-90 minutes 1 times per week.  Today's visit was #: 16 Starting weight: 236 lbs Starting date: 06/26/2020 Today's weight: 202 lbs Today's date: 06/14/2021 Total lbs lost to date: 34 Total lbs lost since last in-office visit: 0  Interim History: Catherine Christian had planned and celebrated a baby shower over the weekend. She is currently bloated. She has been slacking a bit on category 3 due to recent death in the family. Pt has no upcoming plans for the next few weeks. She wants to get back on category 3.  Subjective:   1. Vitamin D deficiency Pt denies nausea, vomiting, and muscle weakness but notes fatigue. Pt is on OTC Vit D.  Assessment/Plan:   1. Vitamin D deficiency Low Vitamin D level contributes to fatigue and are associated with obesity, breast, and colon cancer. She agrees to continue to take OTC Vitamin D daily and will follow-up for routine testing of Vitamin D, at least 2-3 times per year to avoid over-replacement.  2. Obesity with current BMI of 34.8  Catherine Christian is currently in the action stage of change. As such, her goal is to continue with weight loss efforts. She has agreed to the Category 3 Plan.   Exercise goals: All adults should avoid inactivity. Some physical activity is better than none, and adults who participate in any amount of physical activity gain some health benefits. Aim for at least 15 minutes 4 times a week.  Behavioral modification strategies: increasing lean protein intake, meal planning and cooking strategies, and keeping healthy foods in the home.  Catherine Christian has agreed to follow-up with our clinic in 3-4 weeks. She was informed of the importance of frequent follow-up  visits to maximize her success with intensive lifestyle modifications for her multiple health conditions.   Objective:   Blood pressure 100/68, pulse 79, temperature 98.5 F (36.9 C), height 5\' 4"  (1.626 m), weight 202 lb (91.6 kg), last menstrual period 06/12/2021, SpO2 99 %, currently breastfeeding. Body mass index is 34.67 kg/m.  General: Cooperative, alert, well developed, in no acute distress. HEENT: Conjunctivae and lids unremarkable. Cardiovascular: Regular rhythm.  Lungs: Normal work of breathing. Neurologic: No focal deficits.   Lab Results  Component Value Date   CREATININE 0.59 04/30/2021   BUN 19 04/30/2021   NA 141 04/30/2021   K 4.4 04/30/2021   CL 105 04/30/2021   CO2 21 04/30/2021   Lab Results  Component Value Date   ALT 25 04/30/2021   AST 20 04/30/2021   ALKPHOS 49 04/30/2021   BILITOT 0.5 04/30/2021   Lab Results  Component Value Date   HGBA1C 5.4 04/30/2021   HGBA1C 5.5 11/02/2020   Lab Results  Component Value Date   INSULIN 11.0 04/30/2021   INSULIN 7.0 11/02/2020   INSULIN 14.3 06/26/2020   Lab Results  Component Value Date   TSH 1.650 06/26/2020   Lab Results  Component Value Date   CHOL 159 06/26/2020   HDL 63 06/26/2020   LDLCALC 81 06/26/2020   TRIG 77 06/26/2020   Lab Results  Component Value Date   VD25OH 49.2 04/30/2021   VD25OH 50.6 11/02/2020   VD25OH 30.3 06/26/2020   Lab Results  Component Value Date  WBC 6.7 06/26/2020   HGB 14.2 06/26/2020   HCT 43.7 06/26/2020   MCV 87 06/26/2020   PLT 267 06/26/2020    Attestation Statements:   Reviewed by clinician on day of visit: allergies, medications, problem list, medical history, surgical history, family history, social history, and previous encounter notes.  Time spent on visit including pre-visit chart review and post-visit care and charting was 17 minutes.   Edmund Hilda, CMA, am acting as transcriptionist for Reuben Likes, MD.   I have reviewed  the above documentation for accuracy and completeness, and I agree with the above. - Reuben Likes, MD

## 2021-07-09 ENCOUNTER — Ambulatory Visit (INDEPENDENT_AMBULATORY_CARE_PROVIDER_SITE_OTHER): Payer: 59 | Admitting: Family Medicine

## 2021-07-09 ENCOUNTER — Encounter (INDEPENDENT_AMBULATORY_CARE_PROVIDER_SITE_OTHER): Payer: Self-pay | Admitting: Family Medicine

## 2021-07-09 ENCOUNTER — Other Ambulatory Visit: Payer: Self-pay

## 2021-07-09 VITALS — BP 110/76 | HR 86 | Temp 98.9°F | Ht 64.0 in | Wt 199.0 lb

## 2021-07-09 DIAGNOSIS — E559 Vitamin D deficiency, unspecified: Secondary | ICD-10-CM

## 2021-07-09 DIAGNOSIS — Z6841 Body Mass Index (BMI) 40.0 and over, adult: Secondary | ICD-10-CM | POA: Diagnosis not present

## 2021-07-09 NOTE — Progress Notes (Signed)
Chief Complaint:   OBESITY Catherine Christian is here to discuss her progress with her obesity treatment plan along with follow-up of her obesity related diagnoses. Graviela is on the Category 3 Plan and keeping a food journal and adhering to recommended goals of 1550 calories and 95 grams protein and states she is following her eating plan approximately 85-90% of the time. Lashaunta states she is walking 30 minutes 2-3 times per week.  Today's visit was #: 17 Starting weight: 236 lbs Starting date: 06/26/2020 Today's weight: 199 lbs Today's date: 07/09/2021 Total lbs lost to date: 37 Total lbs lost since last in-office visit: 3  Interim History: Shondrea has had a few weeks. Her neighbors held a Fall activity over the weekend. She has been journaling and has realized that she may not tolerate beef after experiencing a tick bite years ago. She is not experiencing many issues with calories or protein.  Subjective:   1. Vitamin D deficiency Dillon is on OTC Vit D daily. Pt denies nausea, vomiting, and muscle weakness but notes fatigue.   Assessment/Plan:   1. Vitamin D deficiency Low Vitamin D level contributes to fatigue and are associated with obesity, breast, and colon cancer. She agrees to continue to take OTC Vitamin D daily and will follow-up for routine testing of Vitamin D, at least 2-3 times per year to avoid over-replacement.  2. Obesity with current BMI of 34.2  Kahli is currently in the action stage of change. As such, her goal is to continue with weight loss efforts. She has agreed to the Category 3 Plan and keeping a food journal and adhering to recommended goals of 1550 calories and 95+ grams protein.   Exercise goals: All adults should avoid inactivity. Some physical activity is better than none, and adults who participate in any amount of physical activity gain some health benefits.  Behavioral modification strategies: increasing lean protein intake, meal planning and  cooking strategies, keeping healthy foods in the home, and planning for success.  Rasha has agreed to follow-up with our clinic in 3-4 weeks. She was informed of the importance of frequent follow-up visits to maximize her success with intensive lifestyle modifications for her multiple health conditions.   Objective:   Blood pressure 110/76, pulse 86, temperature 98.9 F (37.2 C), height 5\' 4"  (1.626 m), weight 199 lb (90.3 kg), last menstrual period 06/12/2021, SpO2 98 %, currently breastfeeding. Body mass index is 34.16 kg/m.  General: Cooperative, alert, well developed, in no acute distress. HEENT: Conjunctivae and lids unremarkable. Cardiovascular: Regular rhythm.  Lungs: Normal work of breathing. Neurologic: No focal deficits.   Lab Results  Component Value Date   CREATININE 0.59 04/30/2021   BUN 19 04/30/2021   NA 141 04/30/2021   K 4.4 04/30/2021   CL 105 04/30/2021   CO2 21 04/30/2021   Lab Results  Component Value Date   ALT 25 04/30/2021   AST 20 04/30/2021   ALKPHOS 49 04/30/2021   BILITOT 0.5 04/30/2021   Lab Results  Component Value Date   HGBA1C 5.4 04/30/2021   HGBA1C 5.5 11/02/2020   Lab Results  Component Value Date   INSULIN 11.0 04/30/2021   INSULIN 7.0 11/02/2020   INSULIN 14.3 06/26/2020   Lab Results  Component Value Date   TSH 1.650 06/26/2020   Lab Results  Component Value Date   CHOL 159 06/26/2020   HDL 63 06/26/2020   LDLCALC 81 06/26/2020   TRIG 77 06/26/2020   Lab Results  Component Value Date   VD25OH 49.2 04/30/2021   VD25OH 50.6 11/02/2020   VD25OH 30.3 06/26/2020   Lab Results  Component Value Date   WBC 6.7 06/26/2020   HGB 14.2 06/26/2020   HCT 43.7 06/26/2020   MCV 87 06/26/2020   PLT 267 06/26/2020   Attestation Statements:   Reviewed by clinician on day of visit: allergies, medications, problem list, medical history, surgical history, family history, social history, and previous encounter notes.  Time  spent on visit including pre-visit chart review and post-visit care and charting was 15 minutes.   Edmund Hilda, CMA, am acting as transcriptionist for Reuben Likes, MD.   I have reviewed the above documentation for accuracy and completeness, and I agree with the above. - Reuben Likes, MD

## 2021-07-30 ENCOUNTER — Ambulatory Visit (INDEPENDENT_AMBULATORY_CARE_PROVIDER_SITE_OTHER): Payer: 59 | Admitting: Family Medicine

## 2021-07-30 ENCOUNTER — Encounter (INDEPENDENT_AMBULATORY_CARE_PROVIDER_SITE_OTHER): Payer: Self-pay | Admitting: Family Medicine

## 2021-07-30 ENCOUNTER — Other Ambulatory Visit: Payer: Self-pay

## 2021-07-30 VITALS — BP 107/78 | HR 72 | Temp 98.6°F | Ht 64.0 in | Wt 197.0 lb

## 2021-07-30 DIAGNOSIS — Z6841 Body Mass Index (BMI) 40.0 and over, adult: Secondary | ICD-10-CM | POA: Diagnosis not present

## 2021-07-30 DIAGNOSIS — E559 Vitamin D deficiency, unspecified: Secondary | ICD-10-CM

## 2021-07-30 NOTE — Progress Notes (Signed)
Chief Complaint:   OBESITY Catherine Christian is here to discuss her progress with her obesity treatment plan along with follow-up of her obesity related diagnoses. Catherine Christian is on the Category 3 Plan and keeping a food journal and adhering to recommended goals of 1550 calories and 95 grams protein and states she is following her eating plan approximately 90% of the time. Catherine Christian states she is walking 30-90 minutes 2 times per week.  Today's visit was #: 18 Starting weight: 236 lbs Starting date: 06/26/2020 Today's weight: 197 lbs Today's date: 07/30/2021 Total lbs lost to date: 39 Total lbs lost since last in-office visit: 2  Interim History: Catherine Christian had felt it was much easier to be on plan since her husband got on the same program. She is planning meals for home and it feels more like a team effort. Pt is doing mostly category 3 but is still journaling. She is going to her mom's house for Thanksgiving. She is mostly walking. Pt is planning a trip to [place] the second week of December.  Subjective:   1. Vitamin D deficiency Catherine Christian is on OTC Vit D, and her last Vit D level was 49.2.  Assessment/Plan:   1. Vitamin D deficiency Low Vitamin D level contributes to fatigue and are associated with obesity, breast, and colon cancer. She agrees to continue to take OTC Vitamin D daily and will follow-up for routine testing of Vitamin D, at least 2-3 times per year to avoid over-replacement.  2. Obesity with current BMI of 33.8  Catherine Christian is currently in the action stage of change. As such, her goal is to continue with weight loss efforts. She has agreed to the Category 3 Plan and keeping a food journal and adhering to recommended goals of 1550 calories and 95+ grams protein.   Exercise goals: All adults should avoid inactivity. Some physical activity is better than none, and adults who participate in any amount of physical activity gain some health benefits. Pt is aiming to start structured  physical activity mid January.  Behavioral modification strategies: increasing lean protein intake, meal planning and cooking strategies, and keeping healthy foods in the home.  Catherine Christian has agreed to follow-up with our clinic in 3-4 weeks. She was informed of the importance of frequent follow-up visits to maximize her success with intensive lifestyle modifications for her multiple health conditions.   Objective:   Blood pressure 107/78, pulse 72, temperature 98.6 F (37 C), height 5\' 4"  (1.626 m), weight 197 lb (89.4 kg), last menstrual period 07/09/2021, SpO2 96 %, currently breastfeeding. Body mass index is 33.81 kg/m.  General: Cooperative, alert, well developed, in no acute distress. HEENT: Conjunctivae and lids unremarkable. Cardiovascular: Regular rhythm.  Lungs: Normal work of breathing. Neurologic: No focal deficits.   Lab Results  Component Value Date   CREATININE 0.59 04/30/2021   BUN 19 04/30/2021   NA 141 04/30/2021   K 4.4 04/30/2021   CL 105 04/30/2021   CO2 21 04/30/2021   Lab Results  Component Value Date   ALT 25 04/30/2021   AST 20 04/30/2021   ALKPHOS 49 04/30/2021   BILITOT 0.5 04/30/2021   Lab Results  Component Value Date   HGBA1C 5.4 04/30/2021   HGBA1C 5.5 11/02/2020   Lab Results  Component Value Date   INSULIN 11.0 04/30/2021   INSULIN 7.0 11/02/2020   INSULIN 14.3 06/26/2020   Lab Results  Component Value Date   TSH 1.650 06/26/2020   Lab Results  Component Value  Date   CHOL 159 06/26/2020   HDL 63 06/26/2020   LDLCALC 81 06/26/2020   TRIG 77 06/26/2020   Lab Results  Component Value Date   VD25OH 49.2 04/30/2021   VD25OH 50.6 11/02/2020   VD25OH 30.3 06/26/2020   Lab Results  Component Value Date   WBC 6.7 06/26/2020   HGB 14.2 06/26/2020   HCT 43.7 06/26/2020   MCV 87 06/26/2020   PLT 267 06/26/2020    Attestation Statements:   Reviewed by clinician on day of visit: allergies, medications, problem list, medical  history, surgical history, family history, social history, and previous encounter notes.  Edmund Hilda, CMA, am acting as transcriptionist for Reuben Likes, MD.   I have reviewed the above documentation for accuracy and completeness, and I agree with the above. - Reuben Likes, MD

## 2021-08-23 ENCOUNTER — Ambulatory Visit (INDEPENDENT_AMBULATORY_CARE_PROVIDER_SITE_OTHER): Payer: 59 | Admitting: Family Medicine

## 2021-08-23 ENCOUNTER — Encounter (INDEPENDENT_AMBULATORY_CARE_PROVIDER_SITE_OTHER): Payer: Self-pay | Admitting: Family Medicine

## 2021-08-23 ENCOUNTER — Other Ambulatory Visit: Payer: Self-pay

## 2021-08-23 VITALS — BP 104/71 | HR 83 | Temp 98.4°F | Ht 64.0 in | Wt 198.0 lb

## 2021-08-23 DIAGNOSIS — Z6841 Body Mass Index (BMI) 40.0 and over, adult: Secondary | ICD-10-CM | POA: Diagnosis not present

## 2021-08-23 DIAGNOSIS — E8881 Metabolic syndrome: Secondary | ICD-10-CM

## 2021-08-23 DIAGNOSIS — E559 Vitamin D deficiency, unspecified: Secondary | ICD-10-CM | POA: Diagnosis not present

## 2021-08-23 NOTE — Progress Notes (Signed)
Chief Complaint:   OBESITY Catherine Christian is here to discuss her progress with her obesity treatment plan along with follow-up of her obesity related diagnoses. Catherine Christian is on the Category 3 Plan and keeping a food journal and adhering to recommended goals of 1550 calories and 95+ grams protein and states she is following her eating plan approximately 50% of the time. Catherine Christian states she is working out 45 minutes 6 times per week.  Today's visit was #: 19 Starting weight: 236 lbs Starting date: 06/26/2020 Today's weight: 198 lbs Today's date: 08/23/2021 Total lbs lost to date: 38 Total lbs lost since last in-office visit: 0  Interim History: Pt had a busy Thanksgiving. While home, she was sticking to the meal plan but did have some events over the holiday. She is going to [place] for the next 4 days but also has some holiday events in the next few weeks. Pt has started exercising at the [facility] recently as well.  Subjective:   1. Insulin resistance Catherine Christian's A1c is 5.4 and insulin level 11.0. She is not on medication.  2. Vitamin D deficiency Pt is on OTC Vit D and her last Vit D level was 49.2.  Assessment/Plan:   1. Insulin resistance Catherine Christian will continue to work on weight loss, exercise, and decreasing simple carbohydrates to help decrease the risk of diabetes. Catherine Christian agreed to follow-up with [place] as directed to closely monitor her progress. Repeat labs in February.  2. Vitamin D deficiency Low Vitamin D level contributes to fatigue and are associated with obesity, breast, and colon cancer. She agrees to continue to take OTC Vitamin D and will follow-up for routine testing of Vitamin D, at least 2-3 times per year to avoid over-replacement.  3. Obesity with current BMI of 34.1  Catherine Christian is currently in the action stage of change. As such, her goal is to continue with weight loss efforts. She has agreed to the Category 3 Plan and keeping a food journal and adhering to recommended goals  of 1550 calories and 95+ grams protein.   Exercise goals:  As is  Behavioral modification strategies: increasing lean protein intake, meal planning and cooking strategies, and keeping healthy foods in the home.  Catherine Christian has agreed to follow-up with our clinic in 3-4 weeks. She was informed of the importance of frequent follow-up visits to maximize her success with intensive lifestyle modifications for her multiple health conditions.   Objective:   Blood pressure 104/71, pulse 83, temperature 98.4 F (36.9 C), height 5\' 4"  (1.626 m), weight 198 lb (89.8 kg), SpO2 99 %, currently breastfeeding. Body mass index is 33.99 kg/m.  General: Cooperative, alert, well developed, in no acute distress. HEENT: Conjunctivae and lids unremarkable. Cardiovascular: Regular rhythm.  Lungs: Normal work of breathing. Neurologic: No focal deficits.   Lab Results  Component Value Date   CREATININE 0.59 04/30/2021   BUN 19 04/30/2021   NA 141 04/30/2021   K 4.4 04/30/2021   CL 105 04/30/2021   CO2 21 04/30/2021   Lab Results  Component Value Date   ALT 25 04/30/2021   AST 20 04/30/2021   ALKPHOS 49 04/30/2021   BILITOT 0.5 04/30/2021   Lab Results  Component Value Date   HGBA1C 5.4 04/30/2021   HGBA1C 5.5 11/02/2020   Lab Results  Component Value Date   INSULIN 11.0 04/30/2021   INSULIN 7.0 11/02/2020   INSULIN 14.3 06/26/2020   Lab Results  Component Value Date   TSH 1.650 06/26/2020  Lab Results  Component Value Date   CHOL 159 06/26/2020   HDL 63 06/26/2020   LDLCALC 81 06/26/2020   TRIG 77 06/26/2020   Lab Results  Component Value Date   VD25OH 49.2 04/30/2021   VD25OH 50.6 11/02/2020   VD25OH 30.3 06/26/2020   Lab Results  Component Value Date   WBC 6.7 06/26/2020   HGB 14.2 06/26/2020   HCT 43.7 06/26/2020   MCV 87 06/26/2020   PLT 267 06/26/2020    Attestation Statements:   Reviewed by clinician on day of visit: allergies, medications, problem list, medical  history, surgical history, family history, social history, and previous encounter notes.  Edmund Hilda, CMA, am acting as transcriptionist for Reuben Likes, MD.  I have reviewed the above documentation for accuracy and completeness, and I agree with the above. - Reuben Likes, MD

## 2021-09-20 ENCOUNTER — Other Ambulatory Visit: Payer: Self-pay

## 2021-09-20 ENCOUNTER — Ambulatory Visit (INDEPENDENT_AMBULATORY_CARE_PROVIDER_SITE_OTHER): Payer: 59 | Admitting: Family Medicine

## 2021-09-20 ENCOUNTER — Encounter (INDEPENDENT_AMBULATORY_CARE_PROVIDER_SITE_OTHER): Payer: Self-pay | Admitting: Family Medicine

## 2021-09-20 VITALS — BP 120/73 | HR 102 | Temp 98.9°F | Ht 64.0 in | Wt 203.0 lb

## 2021-09-20 DIAGNOSIS — E8881 Metabolic syndrome: Secondary | ICD-10-CM | POA: Diagnosis not present

## 2021-09-20 DIAGNOSIS — Z6834 Body mass index (BMI) 34.0-34.9, adult: Secondary | ICD-10-CM | POA: Diagnosis not present

## 2021-09-24 NOTE — Progress Notes (Signed)
Chief Complaint:   OBESITY Catherine Christian is here to discuss her progress with her obesity treatment plan along with follow-up of her obesity related diagnoses. Catherine Christian is on the Category 3 Plan and states she is following her eating plan approximately 35% of the time. Catherine Christian states she is going to the gym 60 minutes 4 times per week.  Today's visit was #: 20 Starting weight: 236 lbs Starting date: 06/26/2020 Today's weight: 203 lbs Today's date: 09/20/2021 Total lbs lost to date: 33 Total lbs lost since last in-office visit: 0  Interim History: Pt had a busy holiday. She realized she wasn't as on plan as she wanted to be due to travel and food choices. She did have some familial strife while traveling. Pt had a good Christmas- had her neighbors come over.  Subjective:   1. Insulin resistance Pt's last A1c was 5.4 with an insulin level of 11.0. She was doing well controlling carbs previously.  Assessment/Plan:   1. Insulin resistance Not at goal. Continue category 3 and f/u labs in February. Catherine Christian will continue to work on weight loss, exercise, and decreasing simple carbohydrates to help decrease the risk of diabetes. Catherine Christian agreed to follow-up with [place] as directed to closely monitor her progress.  2. Obesity with current BMI of 34.9  Catherine Christian is currently in the action stage of change. As such, her goal is to continue with weight loss efforts. She has agreed to the Category 3 Plan.   Exercise goals: All adults should avoid inactivity. Some physical activity is better than none, and adults who participate in any amount of physical activity gain some health benefits.  Behavioral modification strategies: increasing lean protein intake, meal planning and cooking strategies, keeping healthy foods in the home, and planning for success.  Catherine Christian has agreed to follow-up with our clinic in 3 weeks. She was informed of the importance of frequent follow-up visits to maximize her success with  intensive lifestyle modifications for her multiple health conditions.   Objective:   Blood pressure 120/73, pulse (!) 102, temperature 98.9 F (37.2 C), height 5\' 4"  (1.626 m), weight 203 lb (92.1 kg), last menstrual period 09/01/2021, SpO2 95 %, currently breastfeeding. Body mass index is 34.84 kg/m.  General: Cooperative, alert, well developed, in no acute distress. HEENT: Conjunctivae and lids unremarkable. Cardiovascular: Regular rhythm.  Lungs: Normal work of breathing. Neurologic: No focal deficits.   Lab Results  Component Value Date   CREATININE 0.59 04/30/2021   BUN 19 04/30/2021   NA 141 04/30/2021   K 4.4 04/30/2021   CL 105 04/30/2021   CO2 21 04/30/2021   Lab Results  Component Value Date   ALT 25 04/30/2021   AST 20 04/30/2021   ALKPHOS 49 04/30/2021   BILITOT 0.5 04/30/2021   Lab Results  Component Value Date   HGBA1C 5.4 04/30/2021   HGBA1C 5.5 11/02/2020   Lab Results  Component Value Date   INSULIN 11.0 04/30/2021   INSULIN 7.0 11/02/2020   INSULIN 14.3 06/26/2020   Lab Results  Component Value Date   TSH 1.650 06/26/2020   Lab Results  Component Value Date   CHOL 159 06/26/2020   HDL 63 06/26/2020   LDLCALC 81 06/26/2020   TRIG 77 06/26/2020   Lab Results  Component Value Date   VD25OH 49.2 04/30/2021   VD25OH 50.6 11/02/2020   VD25OH 30.3 06/26/2020   Lab Results  Component Value Date   WBC 6.7 06/26/2020   HGB 14.2 06/26/2020  HCT 43.7 06/26/2020   MCV 87 06/26/2020   PLT 267 06/26/2020    Attestation Statements:   Reviewed by clinician on day of visit: allergies, medications, problem list, medical history, surgical history, family history, social history, and previous encounter notes.  Coral Ceo, CMA, am acting as transcriptionist for Coralie Common, MD.   I have reviewed the above documentation for accuracy and completeness, and I agree with the above. - Coralie Common, MD

## 2021-10-11 ENCOUNTER — Ambulatory Visit (INDEPENDENT_AMBULATORY_CARE_PROVIDER_SITE_OTHER): Payer: 59 | Admitting: Family Medicine

## 2021-10-11 ENCOUNTER — Encounter (INDEPENDENT_AMBULATORY_CARE_PROVIDER_SITE_OTHER): Payer: Self-pay | Admitting: Family Medicine

## 2021-10-11 ENCOUNTER — Other Ambulatory Visit: Payer: Self-pay

## 2021-10-11 VITALS — BP 108/72 | HR 88 | Temp 98.8°F | Ht 64.0 in | Wt 207.0 lb

## 2021-10-11 DIAGNOSIS — E669 Obesity, unspecified: Secondary | ICD-10-CM | POA: Diagnosis not present

## 2021-10-11 DIAGNOSIS — Z6841 Body Mass Index (BMI) 40.0 and over, adult: Secondary | ICD-10-CM

## 2021-10-11 DIAGNOSIS — Z6835 Body mass index (BMI) 35.0-35.9, adult: Secondary | ICD-10-CM

## 2021-10-11 DIAGNOSIS — R110 Nausea: Secondary | ICD-10-CM

## 2021-10-11 DIAGNOSIS — Z8632 Personal history of gestational diabetes: Secondary | ICD-10-CM

## 2021-10-15 NOTE — Progress Notes (Signed)
Chief Complaint:   OBESITY Catherine Christian is here to discuss her progress with her obesity treatment plan along with follow-up of her obesity related diagnoses. Catherine Christian is on the Category 3 Plan and states she is following her eating plan approximately 60% of the time. Catherine Christian states she is exercising 60 minutes 2 times per week.  Today's visit was #: 21 Starting weight: 236 lbs Starting date: 06/26/2020 Today's weight: 207 lbs Today's date: 10/11/2021 Total lbs lost to date: 29 Total lbs lost since last in-office visit: 0  Interim History: Pt just found out she is pregnant!!! She wants to know what clinic follow up will look like. She is heating up deli meat. Pt is still nursing her son. She reports occasional nausea without vomiting.  Subjective:   1. History of gestational diabetes Pt was diabetic with first pregnancy. Her last A1c was 5.4.  Assessment/Plan:   1. History of gestational diabetes Follow up with GTT per OB.  2. Obesity with current BMI of 35.7 Catherine Christian is currently in the action stage of change. As such, her goal is to continue with weight loss efforts. She has agreed to the Category 4 Plan.   Exercise goals: All adults should avoid inactivity. Some physical activity is better than none, and adults who participate in any amount of physical activity gain some health benefits.  Behavioral modification strategies: increasing lean protein intake, meal planning and cooking strategies, keeping healthy foods in the home, and planning for success.  Catherine Christian has agreed to follow-up with our clinic in 6 weeks. She was informed of the importance of frequent follow-up visits to maximize her success with intensive lifestyle modifications for her multiple health conditions.   Objective:   Blood pressure 108/72, pulse 88, temperature 98.8 F (37.1 C), height 5\' 4"  (1.626 m), weight 207 lb (93.9 kg), last menstrual period 09/01/2021, SpO2 100 %, currently breastfeeding. Body mass  index is 35.53 kg/m.  General: Cooperative, alert, well developed, in no acute distress. HEENT: Conjunctivae and lids unremarkable. Cardiovascular: Regular rhythm.  Lungs: Normal work of breathing. Neurologic: No focal deficits.   Lab Results  Component Value Date   CREATININE 0.59 04/30/2021   BUN 19 04/30/2021   NA 141 04/30/2021   K 4.4 04/30/2021   CL 105 04/30/2021   CO2 21 04/30/2021   Lab Results  Component Value Date   ALT 25 04/30/2021   AST 20 04/30/2021   ALKPHOS 49 04/30/2021   BILITOT 0.5 04/30/2021   Lab Results  Component Value Date   HGBA1C 5.4 04/30/2021   HGBA1C 5.5 11/02/2020   Lab Results  Component Value Date   INSULIN 11.0 04/30/2021   INSULIN 7.0 11/02/2020   INSULIN 14.3 06/26/2020   Lab Results  Component Value Date   TSH 1.650 06/26/2020   Lab Results  Component Value Date   CHOL 159 06/26/2020   HDL 63 06/26/2020   LDLCALC 81 06/26/2020   TRIG 77 06/26/2020   Lab Results  Component Value Date   VD25OH 49.2 04/30/2021   VD25OH 50.6 11/02/2020   VD25OH 30.3 06/26/2020   Lab Results  Component Value Date   WBC 6.7 06/26/2020   HGB 14.2 06/26/2020   HCT 43.7 06/26/2020   MCV 87 06/26/2020   PLT 267 06/26/2020   Attestation Statements:   Reviewed by clinician on day of visit: allergies, medications, problem list, medical history, surgical history, family history, social history, and previous encounter notes.  Coral Ceo, CMA, am acting  as transcriptionist for Coralie Common, MD.  I have reviewed the above documentation for accuracy and completeness, and I agree with the above. - Coralie Common, MD

## 2021-10-26 LAB — OB RESULTS CONSOLE ABO/RH: RH Type: NEGATIVE

## 2021-10-26 LAB — HEPATITIS C ANTIBODY: HCV Ab: NEGATIVE

## 2021-10-26 LAB — OB RESULTS CONSOLE HIV ANTIBODY (ROUTINE TESTING): HIV: NONREACTIVE

## 2021-10-26 LAB — OB RESULTS CONSOLE HEPATITIS B SURFACE ANTIGEN: Hepatitis B Surface Ag: NEGATIVE

## 2021-10-26 LAB — OB RESULTS CONSOLE RUBELLA ANTIBODY, IGM: Rubella: IMMUNE

## 2021-11-12 LAB — OB RESULTS CONSOLE GC/CHLAMYDIA
Chlamydia: NEGATIVE
Neisseria Gonorrhea: NEGATIVE

## 2021-11-22 ENCOUNTER — Encounter (INDEPENDENT_AMBULATORY_CARE_PROVIDER_SITE_OTHER): Payer: Self-pay | Admitting: Family Medicine

## 2021-11-22 ENCOUNTER — Telehealth (INDEPENDENT_AMBULATORY_CARE_PROVIDER_SITE_OTHER): Payer: 59 | Admitting: Family Medicine

## 2021-11-22 ENCOUNTER — Other Ambulatory Visit: Payer: Self-pay

## 2021-11-22 DIAGNOSIS — Z6835 Body mass index (BMI) 35.0-35.9, adult: Secondary | ICD-10-CM

## 2021-11-22 DIAGNOSIS — Z8632 Personal history of gestational diabetes: Secondary | ICD-10-CM

## 2021-11-22 DIAGNOSIS — E669 Obesity, unspecified: Secondary | ICD-10-CM | POA: Diagnosis not present

## 2021-11-22 DIAGNOSIS — R110 Nausea: Secondary | ICD-10-CM

## 2021-11-25 NOTE — Progress Notes (Unsigned)
TeleHealth Visit:  Due to the COVID-19 pandemic, this visit was completed with telemedicine (audio/video) technology to reduce patient and provider exposure as well as to preserve personal protective equipment.   Catherine Christian has verbally consented to this TeleHealth visit. The patient is located at home, the provider is located at the [facility] office. The participants in this visit include the listed provider and patient. The visit was conducted today via MyChart Video.   Chief Complaint: OBESITY Catherine Christian is here to discuss her progress with her obesity treatment plan along with follow-up of her obesity related diagnoses. Catherine Christian is on the Category 4 Plan and states she is following her eating plan approximately 20% of the time. Catherine Christian states she is doing 0 minutes 0 times per week.  Today's visit was #: 22 Starting weight: 236 lbs Starting date: 06/26/2020  Interim History: Catherine Christian notes the past few weeks have been a whirlwind. Some issues with nausea as she is in her first trimester and very nauseous. She has been trying to incorporate some food from the plan back in as tolerated with her nausea. She saw OB earlier, and [redacted] weeks gestation tomorrow. She thinks she has gained 2 lbs.  Subjective:   1. History of gestational diabetes Catherine Christian is currently [redacted] weeks pregnant with twins. Last A1c was 5.4.  Assessment/Plan:   1. History of gestational diabetes Catherine Christian will continue with management per Dr. Delora Fuel.   2. Obesity with current BMI of 35.7 Catherine Christian is currently in the action stage of change. As such, her goal is to continue with weight loss efforts. She has agreed to the Category 4 Plan or practicing portion control and making smarter food choices, such as increasing vegetables and decreasing simple carbohydrates.   Exercise goals: No exercise has been prescribed at this time.  Behavioral modification strategies: increasing lean protein intake, meal planning and  cooking strategies, keeping healthy foods in the home, and planning for success.  Catherine Christian has agreed to follow-up with our clinic in 6 weeks. She was informed of the importance of frequent follow-up visits to maximize her success with intensive lifestyle modifications for her multiple health conditions.  Objective:   VITALS: Per patient if applicable, see vitals. GENERAL: Alert and in no acute distress. CARDIOPULMONARY: No increased WOB. Speaking in clear sentences.  PSYCH: Pleasant and cooperative. Speech normal rate and rhythm. Affect is appropriate. Insight and judgement are appropriate. Attention is focused, linear, and appropriate.  NEURO: Oriented as arrived to appointment on time with no prompting.   Lab Results  Component Value Date   CREATININE 0.59 04/30/2021   BUN 19 04/30/2021   NA 141 04/30/2021   K 4.4 04/30/2021   CL 105 04/30/2021   CO2 21 04/30/2021   Lab Results  Component Value Date   ALT 25 04/30/2021   AST 20 04/30/2021   ALKPHOS 49 04/30/2021   BILITOT 0.5 04/30/2021   Lab Results  Component Value Date   HGBA1C 5.4 04/30/2021   HGBA1C 5.5 11/02/2020   Lab Results  Component Value Date   INSULIN 11.0 04/30/2021   INSULIN 7.0 11/02/2020   INSULIN 14.3 06/26/2020   Lab Results  Component Value Date   TSH 1.650 06/26/2020   Lab Results  Component Value Date   CHOL 159 06/26/2020   HDL 63 06/26/2020   LDLCALC 81 06/26/2020   TRIG 77 06/26/2020   Lab Results  Component Value Date   VD25OH 49.2 04/30/2021   VD25OH 50.6 11/02/2020  VD25OH 30.3 06/26/2020   Lab Results  Component Value Date   WBC 6.7 06/26/2020   HGB 14.2 06/26/2020   HCT 43.7 06/26/2020   MCV 87 06/26/2020   PLT 267 06/26/2020   No results found for: IRON, TIBC, FERRITIN  Attestation Statements:   Reviewed by clinician on day of visit: allergies, medications, problem list, medical history, surgical history, family history, social history, and previous encounter  notes.   I, Trixie Dredge, am acting as transcriptionist for Coralie Common, MD.  I have reviewed the above documentation for accuracy and completeness, and I agree with the above. - ***

## 2022-01-02 ENCOUNTER — Ambulatory Visit (INDEPENDENT_AMBULATORY_CARE_PROVIDER_SITE_OTHER): Payer: 59 | Admitting: Family Medicine

## 2022-01-02 ENCOUNTER — Encounter (INDEPENDENT_AMBULATORY_CARE_PROVIDER_SITE_OTHER): Payer: Self-pay | Admitting: Family Medicine

## 2022-01-02 VITALS — BP 105/70 | HR 93 | Temp 98.7°F | Ht 64.0 in | Wt 214.0 lb

## 2022-01-02 DIAGNOSIS — R638 Other symptoms and signs concerning food and fluid intake: Secondary | ICD-10-CM | POA: Diagnosis not present

## 2022-01-02 DIAGNOSIS — E669 Obesity, unspecified: Secondary | ICD-10-CM | POA: Diagnosis not present

## 2022-01-02 DIAGNOSIS — Z6836 Body mass index (BMI) 36.0-36.9, adult: Secondary | ICD-10-CM

## 2022-01-02 DIAGNOSIS — Z3A17 17 weeks gestation of pregnancy: Secondary | ICD-10-CM

## 2022-01-02 DIAGNOSIS — Z8632 Personal history of gestational diabetes: Secondary | ICD-10-CM

## 2022-01-02 MED ORDER — PRENATAL DHA 200 MG PO CAPS: 1.0000 | ORAL_CAPSULE | Freq: Every day | ORAL | 0 refills | Status: DC

## 2022-01-04 IMAGING — US US MFM OB FOLLOW-UP
1 series · 13 of 28 positions shown · non-contrast
Comparison: none

[Series 1: us mfm ob follow-up · 50 acquisitions, 13 frames shown]
[im 2/50]
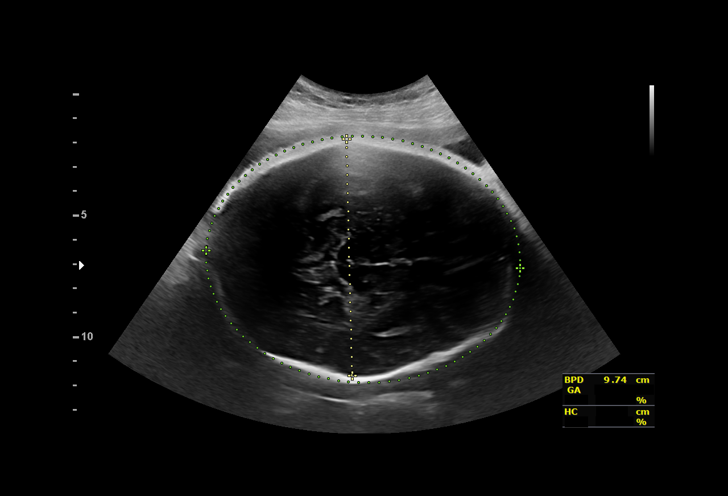
[im 6/50]
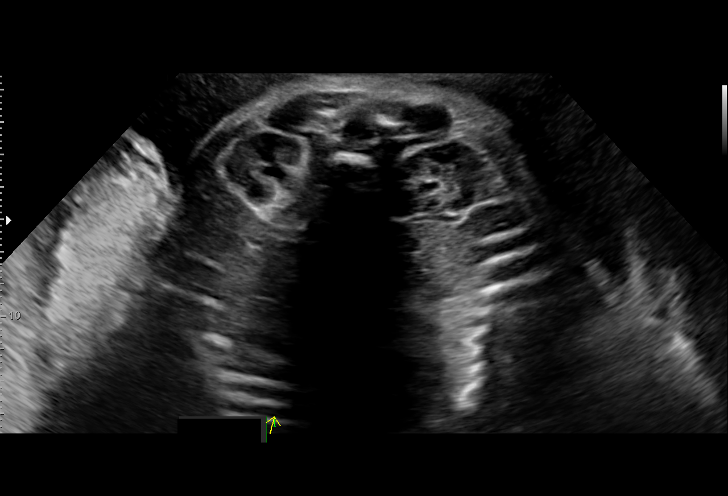
[im 10/50]
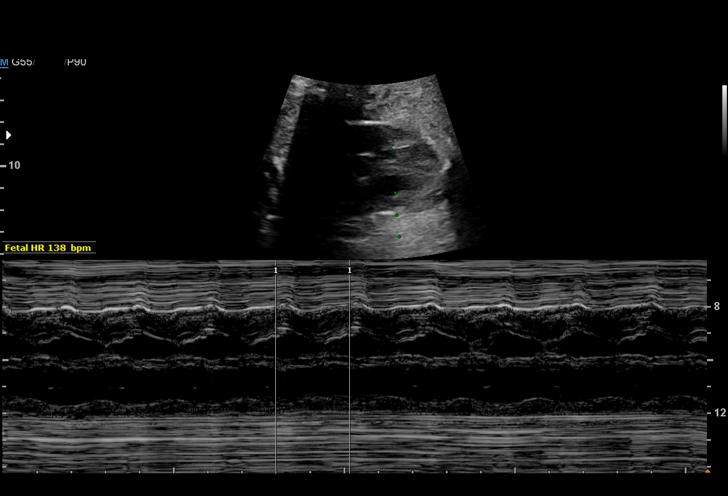
[im 13/50]
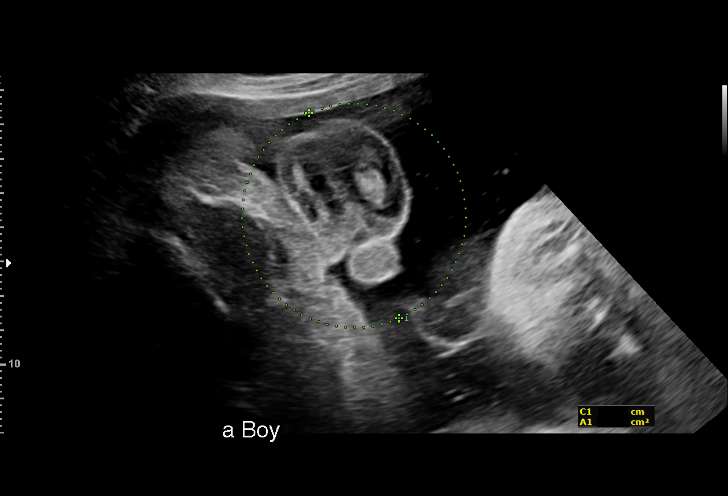
[im 17/50]
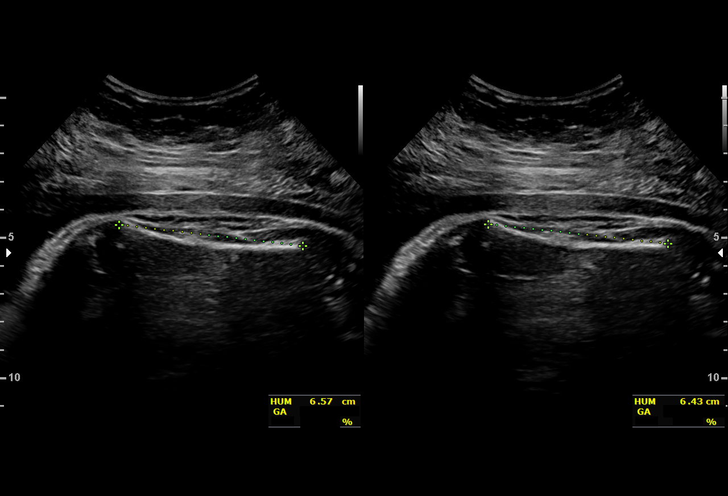
[im 20/50]
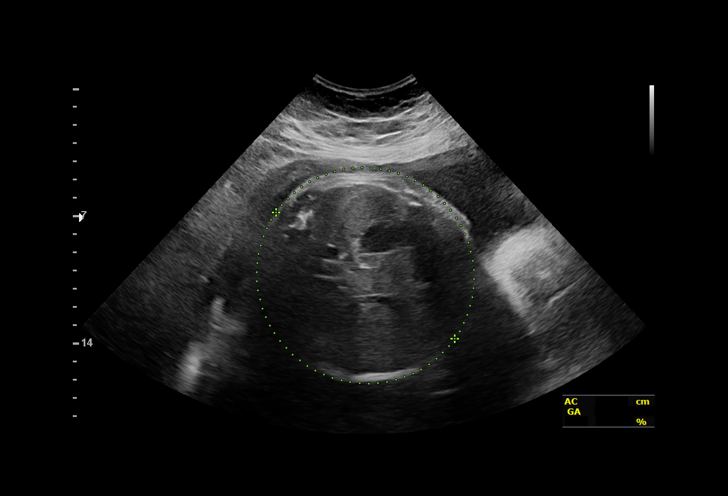
[im 26/50]
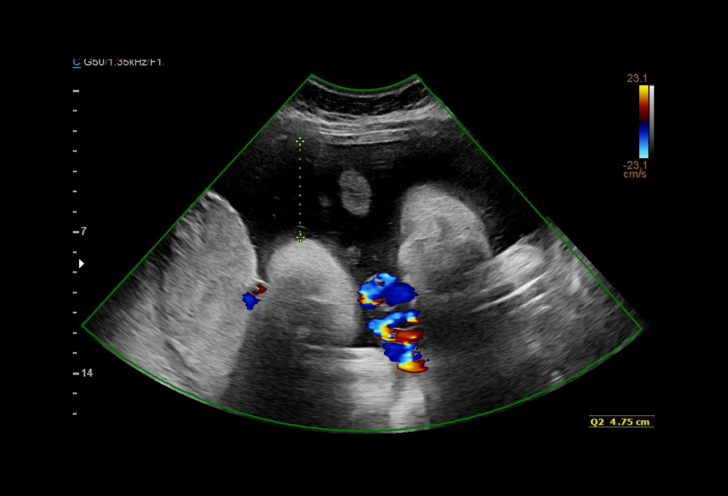
[im 30/50]
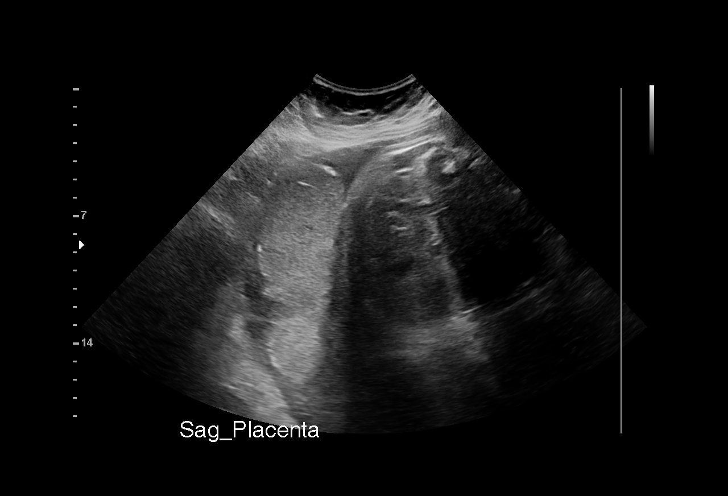
[im 33/50]
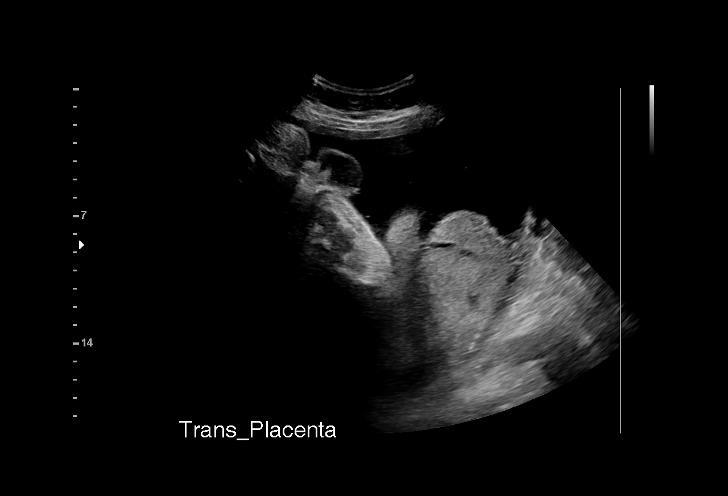
[im 37/50]
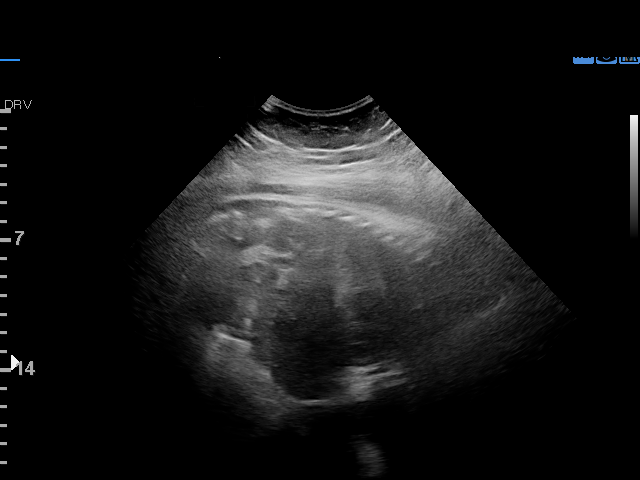
[im 40/50]
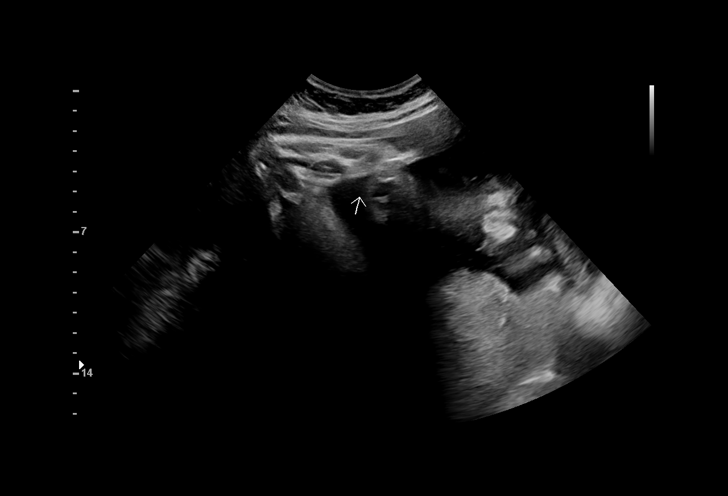
[im 44/50]
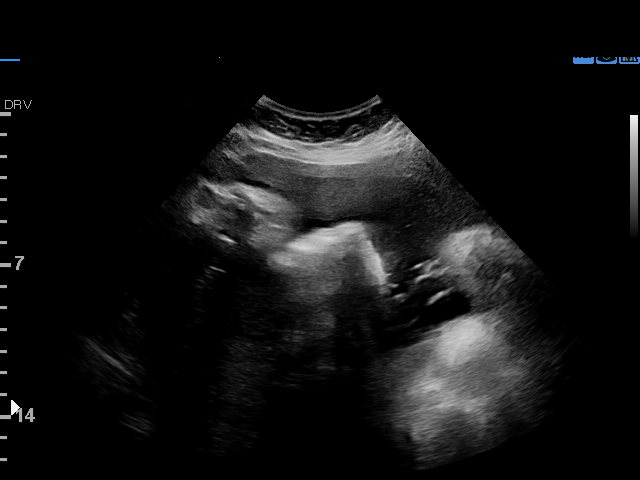
[im 48/50]
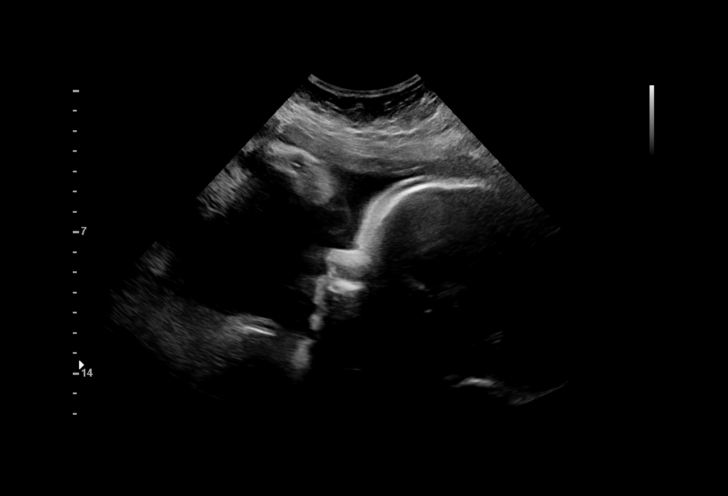

[13 of 28 positions shown; findings below may reference images not displayed]

Indications

 Gestational diabetes in pregnancy,
 controlled by oral hypoglycemic drugs
 Maternal morbid obesity
 37 weeks gestation of pregnancy
 Asthma                                         ONN.JN j04.797
 Rh negative state in antepartum (Rhogam
 given 11-09-19)
 Subchorionic hemorrhage, antepartum (17
 weeks- No VB since)
 Declined genetic testing
 Encounter for other antenatal screening
 follow-up
Fetal Evaluation

 Num Of Fetuses:         1
 Fetal Heart Rate(bpm):  138
 Cardiac Activity:       Observed
 Presentation:           Cephalic
 Placenta:               Posterior
 P. Cord Insertion:      Not well visualized
 Amniotic Fluid
 AFI FV:      Within normal limits

 AFI Sum(cm)     %Tile       Largest Pocket(cm)
 13.9            52

 RUQ(cm)       RLQ(cm)       LUQ(cm)        LLQ(cm)

Biophysical Evaluation

 Amniotic F.V:   Within normal limits       F. Tone:        Observed
 F. Movement:    Observed                   Score:          [DATE]
 F. Breathing:   Observed
Biometry

 BPD:      97.6  mm     G. Age:  40w 0d       > 99  %    CI:        73.08   %    70 - 86
                                                         FL/HC:      19.7   %    20.8 -
 HC:      362.9  mm     G. Age:  42w 6d       > 99  %    HC/AC:      0.97        0.92 -
 AC:      375.4  mm     G. Age:  41w 3d       > 99  %    FL/BPD:     73.2   %    71 - 87
 FL:       71.4  mm     G. Age:  36w 4d         35  %    FL/AC:      19.0   %    20 - 24
 HUM:      64.9  mm     G. Age:  37w 4d         81  %

 Est. FW:    5223  gm      9 lb 1 oz   > 99  %
OB History

 Gravidity:    1         Term:   0        Prem:   0        SAB:   0
 TOP:          0       Ectopic:  0        Living: 0
Gestational Age

 U/S Today:     40w 2d                                        EDD:   03/26/20
 Best:          37w 1d     Det. By:  Early Ultrasound         EDD:   04/17/20
Anatomy

 Cranium:               Appears normal         Aortic Arch:            Not well visualized
 Cavum:                 Previously seen        Ductal Arch:            Not well visualized
 Ventricles:            Previously seen        Diaphragm:              Previously seen
 Choroid Plexus:        Previously seen        Stomach:                Appears normal, left
                                                                       sided
 Cerebellum:            Not well visualized    Abdomen:                Appears normal
 Posterior Fossa:       Not well visualized    Abdominal Wall:         Not well visualized
 Nuchal Fold:           Not applicable (>20    Cord Vessels:           Previously seen
                        wks GA)
 Face:                  Not well visualized    Kidneys:                Appear normal
 Lips:                  Previously seen        Bladder:                Previously seen
 Palate:                Not well visualized    Spine:                  Limited views
                                                                       appear nl prev seen
 Heart:                 Not well visualized    Upper Extremities:      prev Visualized
 RVOT:                  Not well visualized    Lower Extremities:      prev Visualized
 LVOT:                  Not well visualized

 Other:  Fetus appears to be a male. Technically difficult due to advanced GA
         and fetal position, and maternal body habitus.
Cervix Uterus Adnexa

 Cervix
 Not visualized (advanced GA >62wks)
Impression

 Follow up growth for V4Y0F
 Normal interval growth with measurements greater than
 dates. Ms. [place]a Sams[place]ite 99th%. She reports good blood
 sugar control.
 Suboptimal views of the fetal anatomy were again observed,
 secondary to fetal position and maternal habitus.

 Biophysical profile [DATE] there is good fetal movement and
 amniotic fluid.

 She continue metformin 1000mg at bedtime.

Recommendations

 Continue weekly testing
 Consider delivery by 39 weeks, sooner if maternal blood
 sugar is poorly controlled.

## 2022-01-08 ENCOUNTER — Other Ambulatory Visit: Payer: Self-pay | Admitting: Obstetrics and Gynecology

## 2022-01-08 DIAGNOSIS — Z3689 Encounter for other specified antenatal screening: Secondary | ICD-10-CM

## 2022-01-11 NOTE — Progress Notes (Signed)
Chief Complaint:   OBESITY Catherine Christian is here to discuss her progress with her obesity treatment plan along with follow-up of her obesity related diagnoses. Catherine Christian is on the Category 4 Plan or practicing portion control and making smarter food choices, such as increasing vegetables and decreasing simple carbohydrates and states she is following her eating plan approximately 25-30% of the time. Catherine Christian states she is doing 0 minutes 0 times per week.  Today's visit was #: 23 Starting weight: 236 lbs Starting date: 06/26/2020 Today's weight: 214 lbs Today's date: 01/02/2022 Total lbs lost to date: 22 Total lbs lost since last in-office visit: 0  Interim History: Catherine Christian is [redacted] weeks gestation with twins. She still doesn't have the best appetite and she is very inconsistent with eating. She notices that she is still gaining weight, so she feels good otherwise. May need to start getting in the protein shake to get more protein in. She had a history of gestational diabetes in first pregnancy.   Subjective:   1. History of gestational diabetes Catherine Christian had diabetes in her first pregnancy. She will have glucose test in the next few weeks.   2. [redacted] weeks gestation of pregnancy Catherine Christian is currently pregnant with twin girls. She has some improvement in appetite.   Assessment/Plan:   1. History of gestational diabetes Catherine Christian is to follow up with OB in the next few weeks, and she will have tests 2 times.  2. [redacted] weeks gestation of pregnancy We will refill Catherine Christian for 1 month. Patient was given a pregnancy nutrition handout.   - Catherine Christian Acid (PRENATAL DHA) 200 MG CAPS; Take 1 capsule (200 mg total) by mouth daily.; Refill: 0  3. Obesity with current BMI of 36.8 Catherine Christian is currently in the action stage of change. As such, her goal is to continue with weight loss efforts. She has agreed to the Category 4 Plan.   Exercise goals: As is.  Behavioral modification strategies:  increasing lean protein intake, meal planning and cooking strategies, keeping healthy foods in the home, and planning for success.  Catherine Christian has agreed to follow-up with our clinic in 12 weeks. She was informed of the importance of frequent follow-up visits to maximize her success with intensive lifestyle modifications for her multiple health conditions.   Objective:   Blood pressure 105/70, pulse 93, temperature 98.7 F (37.1 C), height 5\' 4"  (1.626 m), weight 214 lb (97.1 kg), SpO2 99 %, currently breastfeeding. Body mass index is 36.73 kg/m.  General: Cooperative, alert, well developed, in no acute distress. HEENT: Conjunctivae and lids unremarkable. Cardiovascular: Regular rhythm.  Lungs: Normal work of breathing. Neurologic: No focal deficits.   Lab Results  Component Value Date   CREATININE 0.59 04/30/2021   BUN 19 04/30/2021   NA 141 04/30/2021   K 4.4 04/30/2021   CL 105 04/30/2021   CO2 21 04/30/2021   Lab Results  Component Value Date   ALT 25 04/30/2021   AST 20 04/30/2021   ALKPHOS 49 04/30/2021   BILITOT 0.5 04/30/2021   Lab Results  Component Value Date   HGBA1C 5.4 04/30/2021   HGBA1C 5.5 11/02/2020   Lab Results  Component Value Date   INSULIN 11.0 04/30/2021   INSULIN 7.0 11/02/2020   INSULIN 14.3 06/26/2020   Lab Results  Component Value Date   TSH 1.650 06/26/2020   Lab Results  Component Value Date   CHOL 159 06/26/2020   HDL 63 06/26/2020   LDLCALC 81 06/26/2020  TRIG 77 06/26/2020   Lab Results  Component Value Date   VD25OH 49.2 04/30/2021   VD25OH 50.6 11/02/2020   VD25OH 30.3 06/26/2020   Lab Results  Component Value Date   WBC 6.7 06/26/2020   HGB 14.2 06/26/2020   HCT 43.7 06/26/2020   MCV 87 06/26/2020   PLT 267 06/26/2020   No results found for: IRON, TIBC, FERRITIN  Attestation Statements:   Reviewed by clinician on day of visit: allergies, medications, problem list, medical history, surgical history, family  history, social history, and previous encounter notes.   I, Burt Knack, am acting as transcriptionist for Reuben Likes, MD.  I have reviewed the above documentation for accuracy and completeness, and I agree with the above. - Reuben Likes, MD

## 2022-01-17 ENCOUNTER — Encounter: Payer: Self-pay | Admitting: *Deleted

## 2022-01-24 ENCOUNTER — Ambulatory Visit: Payer: 59 | Admitting: *Deleted

## 2022-01-24 ENCOUNTER — Ambulatory Visit (HOSPITAL_BASED_OUTPATIENT_CLINIC_OR_DEPARTMENT_OTHER): Payer: 59 | Admitting: Obstetrics and Gynecology

## 2022-01-24 ENCOUNTER — Other Ambulatory Visit: Payer: Self-pay | Admitting: *Deleted

## 2022-01-24 ENCOUNTER — Ambulatory Visit: Payer: 59 | Attending: Obstetrics and Gynecology

## 2022-01-24 VITALS — BP 108/61 | HR 111

## 2022-01-24 DIAGNOSIS — O99212 Obesity complicating pregnancy, second trimester: Secondary | ICD-10-CM | POA: Insufficient documentation

## 2022-01-24 DIAGNOSIS — O3112X0 Continuing pregnancy after spontaneous abortion of one fetus or more, second trimester, not applicable or unspecified: Secondary | ICD-10-CM

## 2022-01-24 DIAGNOSIS — O09292 Supervision of pregnancy with other poor reproductive or obstetric history, second trimester: Secondary | ICD-10-CM

## 2022-01-24 DIAGNOSIS — Z3A20 20 weeks gestation of pregnancy: Secondary | ICD-10-CM

## 2022-01-24 DIAGNOSIS — O3122X1 Continuing pregnancy after intrauterine death of one fetus or more, second trimester, fetus 1: Secondary | ICD-10-CM | POA: Diagnosis not present

## 2022-01-24 DIAGNOSIS — Z3689 Encounter for other specified antenatal screening: Secondary | ICD-10-CM | POA: Diagnosis not present

## 2022-01-24 DIAGNOSIS — E669 Obesity, unspecified: Secondary | ICD-10-CM

## 2022-01-24 DIAGNOSIS — O34219 Maternal care for unspecified type scar from previous cesarean delivery: Secondary | ICD-10-CM

## 2022-01-24 DIAGNOSIS — Z8632 Personal history of gestational diabetes: Secondary | ICD-10-CM

## 2022-01-24 DIAGNOSIS — O09299 Supervision of pregnancy with other poor reproductive or obstetric history, unspecified trimester: Secondary | ICD-10-CM | POA: Diagnosis present

## 2022-01-24 NOTE — Progress Notes (Signed)
Maternal-Fetal Medicine  ? ?Name: Catherine Christian ?DOB: 01-09-1991 ?MRN: [id number] ?Referring Provider: Steva Ready, DO ? ?I had the pleasure of [facility] Catherine [facility] at [facility]. She is G2 P1001 at 26-weeks' gestation with dichorionic-diamniotic twin pregnancy with fetal loss of twin A detected at 71-weeks' gestation. ?She does not give history of vaginal bleeding or leakage of amniotic fluid.  This is a natural conception. ?Obstetric history significant for a term cesarean delivery in July 2021 of a female infant weighing 8 pounds and 8 ounces at birth.  Her pregnancy was complicated by gestational diabetes and preeclampsia.  And is in good health. ?Past medical history: No history of diabetes or hypertension or any other chronic medical conditions. ?Past surgical history: Cesarean section, laparoscopic cholecystectomy (2017), appendectomy. ?Medications: Prenatal vitamins, low-dose aspirin, vitamin D. ?Allergies: No known drug allergies. ?Social history: Denies tobacco or drug or alcohol use.  She been married 6 years and her husband is in good health. ?Family history: No history of venous thromboembolism in the family. ? ?Ultrasound ?We performed a fetal anatomical survey.  A macerated fetus (twin A) was seen. ?Twin B: Amniotic fluid is normal and good fetal activity seen.  Fetal biometry is consistent with the previously established dates.  No markers of aneuploidies or fetal structural defects are seen. ? ?Twin pregnancy with loss of one twin ?A study comparing monochorionic-monoamniotic twin pregnancy with dichorionic diamniotic twin pregnancy showed fetal loss rates were about 10 times higher in monochorionic twins.  Fetal loss in dichorionic-diamniotic twin pregnancy at [redacted] weeks gestation is not associated with any adverse outcomes.  Preterm delivery is slightly increased. ? ?I advised the patient to watch for vaginal bleeding or leakage of amniotic fluid.  I reassured the  couple that fetal growth should not be impacted by loss of co-twin. ? ?We discussed possible causes of fetal demise.  On cell-free fetal DNA screening, the risks of fetal aneuploidies are not increased.  I reassured her that cell-free fetal DNA screening has a greater detection rate for Down syndrome but does not detect all chromosomal anomalies. ? ?Other causes include placental insufficiency or fetal maternal hemorrhage. ? ?History of preeclampsia and gestational diabetes ?Recurrence of preeclampsia is seen to 25% and 50% of cases.  I discussed the benefit of low-dose aspirin prophylaxis in delaying or preventing preeclampsia. ?Patient will be undergoing screening for gestational diabetes.  About 25% to 50% of women with history of gestational diabetes can develop type 2 diabetes.  I recommend postpartum glucose challenge test at 6 to 12 weeks after delivery. ? ?Previous cesarean delivery ?Patient is keen on VBAC.  I reassured her that vaginal delivery can be attempted.  Repeat cesarean deliveries increase the risk of placenta previa or placenta accreta spectrum. ? ?After the patient left, I realized that her blood type is A negative.  If she had not received RhoGAM following fetal demise, I recommend Kleihauer-Betke screening. ? ?Recommendations ?-K-B screening. ?-An appointment was made for her to return in 4 weeks for fetal growth assessment. ?-Fetal growth assessments every 4 weeks. ?-ANORA test in twin A (demise) may be considered after delivery.  ? ?Thank you for consultation.  If you have any questions or concerns, please contact me the [facility].  Consultation including face-to-face (more than 50%) counseling 45 minutes. ? ?

## 2022-01-25 ENCOUNTER — Ambulatory Visit: Payer: Self-pay

## 2022-01-28 ENCOUNTER — Other Ambulatory Visit: Payer: 59

## 2022-01-28 DIAGNOSIS — O0990 Supervision of high risk pregnancy, unspecified, unspecified trimester: Secondary | ICD-10-CM

## 2022-01-28 NOTE — Progress Notes (Signed)
Staff member Dia from [facility] called to request [facility] lab. Pt seen at [facility]FM, not [facility] patient. Added to schedule and order placed for trial. [facility] tech Toniann Fail unable to release lab. Information given for how to send patient to [facility] for lab draw.  ? Fleet Contras RN ?01/28/22 ?

## 2022-01-29 ENCOUNTER — Other Ambulatory Visit (HOSPITAL_COMMUNITY)
Admission: RE | Admit: 2022-01-29 | Discharge: 2022-01-29 | Disposition: A | Discharge status: Home / Self Care | Payer: 59 | Source: Ambulatory Visit | Attending: Obstetrics and Gynecology | Admitting: Obstetrics and Gynecology

## 2022-01-29 DIAGNOSIS — O0990 Supervision of high risk pregnancy, unspecified, unspecified trimester: Secondary | ICD-10-CM | POA: Diagnosis present

## 2022-01-29 DIAGNOSIS — Z3A00 Weeks of gestation of pregnancy not specified: Secondary | ICD-10-CM | POA: Insufficient documentation

## 2022-01-29 LAB — KLEIHAUER-BETKE STAIN
# Vials RhIg: 1
Fetal Cells %: 0 %
Quantitation Fetal Hemoglobin: 0 mL

## 2022-02-21 ENCOUNTER — Ambulatory Visit: Payer: 59 | Attending: Obstetrics and Gynecology

## 2022-02-21 ENCOUNTER — Other Ambulatory Visit: Payer: Self-pay | Admitting: *Deleted

## 2022-02-21 ENCOUNTER — Ambulatory Visit: Payer: 59 | Admitting: *Deleted

## 2022-02-21 VITALS — BP 110/50 | HR 101

## 2022-02-21 DIAGNOSIS — Z6834 Body mass index (BMI) 34.0-34.9, adult: Secondary | ICD-10-CM | POA: Insufficient documentation

## 2022-02-21 DIAGNOSIS — O09292 Supervision of pregnancy with other poor reproductive or obstetric history, second trimester: Secondary | ICD-10-CM

## 2022-02-21 DIAGNOSIS — O3110X0 Continuing pregnancy after spontaneous abortion of one fetus or more, unspecified trimester, not applicable or unspecified: Secondary | ICD-10-CM

## 2022-02-21 DIAGNOSIS — E669 Obesity, unspecified: Secondary | ICD-10-CM

## 2022-02-21 DIAGNOSIS — Z3A24 24 weeks gestation of pregnancy: Secondary | ICD-10-CM

## 2022-02-21 DIAGNOSIS — Z8632 Personal history of gestational diabetes: Secondary | ICD-10-CM | POA: Diagnosis present

## 2022-02-21 DIAGNOSIS — O34219 Maternal care for unspecified type scar from previous cesarean delivery: Secondary | ICD-10-CM

## 2022-02-21 DIAGNOSIS — O99212 Obesity complicating pregnancy, second trimester: Secondary | ICD-10-CM | POA: Diagnosis not present

## 2022-02-21 DIAGNOSIS — O09299 Supervision of pregnancy with other poor reproductive or obstetric history, unspecified trimester: Secondary | ICD-10-CM

## 2022-02-21 DIAGNOSIS — Z8759 Personal history of other complications of pregnancy, childbirth and the puerperium: Secondary | ICD-10-CM

## 2022-03-07 LAB — OB RESULTS CONSOLE RPR: RPR: NONREACTIVE

## 2022-03-21 ENCOUNTER — Ambulatory Visit: Payer: 59 | Admitting: *Deleted

## 2022-03-21 ENCOUNTER — Ambulatory Visit (HOSPITAL_BASED_OUTPATIENT_CLINIC_OR_DEPARTMENT_OTHER): Payer: 59 | Admitting: *Deleted

## 2022-03-21 ENCOUNTER — Other Ambulatory Visit: Payer: Self-pay | Admitting: *Deleted

## 2022-03-21 ENCOUNTER — Ambulatory Visit: Payer: 59 | Attending: Obstetrics and Gynecology

## 2022-03-21 ENCOUNTER — Ambulatory Visit (HOSPITAL_BASED_OUTPATIENT_CLINIC_OR_DEPARTMENT_OTHER): Payer: 59 | Admitting: Maternal & Fetal Medicine

## 2022-03-21 VITALS — BP 110/67 | HR 90

## 2022-03-21 DIAGNOSIS — O99213 Obesity complicating pregnancy, third trimester: Secondary | ICD-10-CM | POA: Diagnosis not present

## 2022-03-21 DIAGNOSIS — Z3A28 28 weeks gestation of pregnancy: Secondary | ICD-10-CM | POA: Diagnosis not present

## 2022-03-21 DIAGNOSIS — O24419 Gestational diabetes mellitus in pregnancy, unspecified control: Secondary | ICD-10-CM | POA: Insufficient documentation

## 2022-03-21 DIAGNOSIS — Z364 Encounter for antenatal screening for fetal growth retardation: Secondary | ICD-10-CM | POA: Insufficient documentation

## 2022-03-21 DIAGNOSIS — Z8759 Personal history of other complications of pregnancy, childbirth and the puerperium: Secondary | ICD-10-CM | POA: Diagnosis present

## 2022-03-21 DIAGNOSIS — O365931 Maternal care for other known or suspected poor fetal growth, third trimester, fetus 1: Secondary | ICD-10-CM

## 2022-03-21 DIAGNOSIS — O09293 Supervision of pregnancy with other poor reproductive or obstetric history, third trimester: Secondary | ICD-10-CM | POA: Insufficient documentation

## 2022-03-21 DIAGNOSIS — E669 Obesity, unspecified: Secondary | ICD-10-CM

## 2022-03-21 DIAGNOSIS — Z8632 Personal history of gestational diabetes: Secondary | ICD-10-CM | POA: Insufficient documentation

## 2022-03-21 DIAGNOSIS — O3113X1 Continuing pregnancy after spontaneous abortion of one fetus or more, third trimester, fetus 1: Secondary | ICD-10-CM | POA: Diagnosis not present

## 2022-03-21 DIAGNOSIS — Z6834 Body mass index (BMI) 34.0-34.9, adult: Secondary | ICD-10-CM

## 2022-03-21 DIAGNOSIS — O09299 Supervision of pregnancy with other poor reproductive or obstetric history, unspecified trimester: Secondary | ICD-10-CM

## 2022-03-21 DIAGNOSIS — O3113X0 Continuing pregnancy after spontaneous abortion of one fetus or more, third trimester, not applicable or unspecified: Secondary | ICD-10-CM | POA: Diagnosis not present

## 2022-03-21 DIAGNOSIS — O34219 Maternal care for unspecified type scar from previous cesarean delivery: Secondary | ICD-10-CM

## 2022-03-21 NOTE — Procedures (Signed)
Catherine Christian 05-09-1991 [redacted]w[redacted]d  Fetus A Non-Stress Test Interpretation for 03/21/22  Indication: IUGR  Fetal Heart Rate A Mode: External Baseline Rate (A): 150 bpm Variability: Moderate Accelerations: 15 x 15 Decelerations: None Multiple birth?: No  Uterine Activity Mode: Toco Contraction Frequency (min): none Resting Tone Palpated: Relaxed  Interpretation (Fetal Testing) Nonstress Test Interpretation: Reactive Overall Impression: Reassuring for gestational age Comments: tracing reviewed by Dr. Grace Bushy

## 2022-03-21 NOTE — Progress Notes (Signed)
MFM Brief Note  Catherine Christian is a 31 yo G2P1 who is here for follow up growth at the request of Dr. Steva Ready  She is overall doing well without complaints.   Her blood pressure is 110/76 mmHg.  Follow up growth a twin pregnancy with a prior twin demise. Normal interval growth with measurements consistent with fetal growth restriction with an EFW 7%. Good fetal movement and amniotic fluid volume  NST reactive today.  The UA dopplers are normal without evidence of AEDF or REDF  I discussed today's visit with a diagnosis of FGR. I explained that the etiology includes placental insufficiency, chronic disease, infection, aneuploidy and other genetic syndromes. She has a low risk NIPS and neg horizon. She has no additional risk factors for chronic disease.   At this time I explained the diagnosis, evaluation and management to include serial fetal growth and weekly antenatal testing to include UA Dopplers.   If the EFW < 3rd% or abnormal testing, I recommend delivery at 37 weeks otherwise if all is normal consider delivery at 39 weeks.   I spent 20 minutes with > 50% in face to face consultation.  Catherine Olive, MD

## 2022-03-25 ENCOUNTER — Other Ambulatory Visit: Payer: Self-pay | Admitting: *Deleted

## 2022-03-25 DIAGNOSIS — O365990 Maternal care for other known or suspected poor fetal growth, unspecified trimester, not applicable or unspecified: Secondary | ICD-10-CM

## 2022-03-25 DIAGNOSIS — O24410 Gestational diabetes mellitus in pregnancy, diet controlled: Secondary | ICD-10-CM

## 2022-04-01 ENCOUNTER — Encounter: Payer: Self-pay | Admitting: *Deleted

## 2022-04-01 ENCOUNTER — Ambulatory Visit (HOSPITAL_BASED_OUTPATIENT_CLINIC_OR_DEPARTMENT_OTHER): Payer: 59 | Admitting: Obstetrics

## 2022-04-01 ENCOUNTER — Ambulatory Visit: Payer: 59 | Admitting: *Deleted

## 2022-04-01 ENCOUNTER — Ambulatory Visit: Payer: 59 | Attending: Maternal & Fetal Medicine

## 2022-04-01 VITALS — BP 116/68 | HR 106

## 2022-04-01 DIAGNOSIS — O99213 Obesity complicating pregnancy, third trimester: Secondary | ICD-10-CM

## 2022-04-01 DIAGNOSIS — O365990 Maternal care for other known or suspected poor fetal growth, unspecified trimester, not applicable or unspecified: Secondary | ICD-10-CM | POA: Insufficient documentation

## 2022-04-01 DIAGNOSIS — E669 Obesity, unspecified: Secondary | ICD-10-CM

## 2022-04-01 DIAGNOSIS — O365930 Maternal care for other known or suspected poor fetal growth, third trimester, not applicable or unspecified: Secondary | ICD-10-CM | POA: Diagnosis present

## 2022-04-01 DIAGNOSIS — O24415 Gestational diabetes mellitus in pregnancy, controlled by oral hypoglycemic drugs: Secondary | ICD-10-CM | POA: Insufficient documentation

## 2022-04-01 DIAGNOSIS — O3113X2 Continuing pregnancy after spontaneous abortion of one fetus or more, third trimester, fetus 2: Secondary | ICD-10-CM | POA: Diagnosis not present

## 2022-04-01 DIAGNOSIS — O34219 Maternal care for unspecified type scar from previous cesarean delivery: Secondary | ICD-10-CM

## 2022-04-01 DIAGNOSIS — Z3A30 30 weeks gestation of pregnancy: Secondary | ICD-10-CM

## 2022-04-01 DIAGNOSIS — O24419 Gestational diabetes mellitus in pregnancy, unspecified control: Secondary | ICD-10-CM | POA: Diagnosis present

## 2022-04-01 DIAGNOSIS — O365931 Maternal care for other known or suspected poor fetal growth, third trimester, fetus 1: Secondary | ICD-10-CM | POA: Diagnosis present

## 2022-04-01 DIAGNOSIS — O365932 Maternal care for other known or suspected poor fetal growth, third trimester, fetus 2: Secondary | ICD-10-CM

## 2022-04-01 DIAGNOSIS — O09293 Supervision of pregnancy with other poor reproductive or obstetric history, third trimester: Secondary | ICD-10-CM | POA: Diagnosis not present

## 2022-04-01 DIAGNOSIS — O24410 Gestational diabetes mellitus in pregnancy, diet controlled: Secondary | ICD-10-CM | POA: Insufficient documentation

## 2022-04-01 NOTE — Procedures (Signed)
Catherine Christian 05-06-1991 [redacted]w[redacted]d  Fetus A Non-Stress Test Interpretation for 04/01/22  Indication: IUGR and Gestational Diabetes medication controlled  Fetal Heart Rate A Mode: External Baseline Rate (A): 145 bpm Variability: Moderate Accelerations: 15 x 15 Decelerations: None Multiple birth?: No  Uterine Activity Mode: Palpation, Toco Contraction Frequency (min): None  Interpretation (Fetal Testing) Nonstress Test Interpretation: Reactive Comments: Dr. Parke Poisson reviewed tracing.

## 2022-04-03 ENCOUNTER — Ambulatory Visit (INDEPENDENT_AMBULATORY_CARE_PROVIDER_SITE_OTHER): Payer: 59 | Admitting: Family Medicine

## 2022-04-03 ENCOUNTER — Encounter (INDEPENDENT_AMBULATORY_CARE_PROVIDER_SITE_OTHER): Payer: Self-pay | Admitting: Family Medicine

## 2022-04-03 VITALS — BP 107/70 | HR 98 | Temp 98.8°F | Ht 64.0 in | Wt 234.0 lb

## 2022-04-03 DIAGNOSIS — Z8632 Personal history of gestational diabetes: Secondary | ICD-10-CM | POA: Diagnosis not present

## 2022-04-03 DIAGNOSIS — Z6841 Body Mass Index (BMI) 40.0 and over, adult: Secondary | ICD-10-CM

## 2022-04-03 DIAGNOSIS — E669 Obesity, unspecified: Secondary | ICD-10-CM

## 2022-04-08 NOTE — Progress Notes (Unsigned)
Chief Complaint:   OBESITY Catherine Christian is here to discuss her progress with her obesity treatment plan along with follow-up of her obesity related diagnoses. Catherine Christian is on the Category 4 Plan and states she is following her eating plan approximately 80-90% of the time. Catherine Christian states she is walking.  Today's visit was #: 24 Starting weight: 236 lbs Starting date: 06/26/2020 Today's weight: 234 lbs Today's date: 04/03/2022 Total lbs lost to date: 2 lbs Total lbs lost since last in-office visit: 0  Interim History: Catherine Christian is currently [redacted] weeks gestation. She was diagnosed with gestational diabetes and is managing with diet changes and Metformin. Diagnosed with IUGR. She is planning on  beach trip in the 1st weekend of August.  Subjective:   1. History of gestational diabetes Catherine Christian diagnosed and managing with diet and Metformin. Occasional GI side effects.  Assessment/Plan:   1. History of gestational diabetes Continue Metformin and will need sugar retest in 6-8 weeks postpartum.  2. Obesity with current BMI of 40.2 Catherine Christian is currently in the action stage of change. As such, her goal is to continue with weight loss efforts. She has agreed to the Category 4 Plan.   Exercise goals: As is.  Behavioral modification strategies: increasing lean protein intake, meal planning and cooking strategies, and keeping healthy foods in the home.  Catherine Christian has agreed to follow-up with our clinic in 4 months. She was informed of the importance of frequent follow-up visits to maximize her success with intensive lifestyle modifications for her multiple health conditions.   Objective:   Blood pressure 107/70, pulse 98, temperature 98.8 F (37.1 C), height 5\' 4"  (1.626 m), weight 234 lb (106.1 kg), last menstrual period 09/01/2021, SpO2 97 %, currently breastfeeding. Body mass index is 40.17 kg/m.  General: Cooperative, alert, well developed, in no acute distress. HEENT: Conjunctivae and lids  unremarkable. Cardiovascular: Regular rhythm.  Lungs: Normal work of breathing. Neurologic: No focal deficits.   Lab Results  Component Value Date   CREATININE 0.59 04/30/2021   BUN 19 04/30/2021   NA 141 04/30/2021   K 4.4 04/30/2021   CL 105 04/30/2021   CO2 21 04/30/2021   Lab Results  Component Value Date   ALT 25 04/30/2021   AST 20 04/30/2021   ALKPHOS 49 04/30/2021   BILITOT 0.5 04/30/2021   Lab Results  Component Value Date   HGBA1C 5.4 04/30/2021   HGBA1C 5.5 11/02/2020   Lab Results  Component Value Date   INSULIN 11.0 04/30/2021   INSULIN 7.0 11/02/2020   INSULIN 14.3 06/26/2020   Lab Results  Component Value Date   TSH 1.650 06/26/2020   Lab Results  Component Value Date   CHOL 159 06/26/2020   HDL 63 06/26/2020   LDLCALC 81 06/26/2020   TRIG 77 06/26/2020   Lab Results  Component Value Date   VD25OH 49.2 04/30/2021   VD25OH 50.6 11/02/2020   VD25OH 30.3 06/26/2020   Lab Results  Component Value Date   WBC 6.7 06/26/2020   HGB 14.2 06/26/2020   HCT 43.7 06/26/2020   MCV 87 06/26/2020   PLT 267 06/26/2020   No results found for: "IRON", "TIBC", "FERRITIN"  Attestation Statements:   Reviewed by clinician on day of visit: allergies, medications, problem list, medical history, surgical history, family history, social history, and previous encounter notes.  I, 08/26/2020, RMA am acting as transcriptionist for Fortino Sic, MD. I have reviewed the above documentation for accuracy and completeness, and I  agree with the above. - Reuben Likes, MD

## 2022-04-09 ENCOUNTER — Ambulatory Visit: Payer: 59 | Admitting: *Deleted

## 2022-04-09 ENCOUNTER — Encounter: Payer: Self-pay | Admitting: *Deleted

## 2022-04-09 ENCOUNTER — Ambulatory Visit: Payer: 59 | Attending: Maternal & Fetal Medicine

## 2022-04-09 ENCOUNTER — Other Ambulatory Visit: Payer: Self-pay | Admitting: *Deleted

## 2022-04-09 VITALS — BP 106/61 | HR 81

## 2022-04-09 DIAGNOSIS — Z6834 Body mass index (BMI) 34.0-34.9, adult: Secondary | ICD-10-CM

## 2022-04-09 DIAGNOSIS — O365930 Maternal care for other known or suspected poor fetal growth, third trimester, not applicable or unspecified: Secondary | ICD-10-CM

## 2022-04-09 DIAGNOSIS — O24410 Gestational diabetes mellitus in pregnancy, diet controlled: Secondary | ICD-10-CM

## 2022-04-09 DIAGNOSIS — O24419 Gestational diabetes mellitus in pregnancy, unspecified control: Secondary | ICD-10-CM | POA: Diagnosis present

## 2022-04-09 DIAGNOSIS — O24415 Gestational diabetes mellitus in pregnancy, controlled by oral hypoglycemic drugs: Secondary | ICD-10-CM

## 2022-04-09 DIAGNOSIS — O3113X0 Continuing pregnancy after spontaneous abortion of one fetus or more, third trimester, not applicable or unspecified: Secondary | ICD-10-CM | POA: Diagnosis not present

## 2022-04-09 DIAGNOSIS — O365931 Maternal care for other known or suspected poor fetal growth, third trimester, fetus 1: Secondary | ICD-10-CM | POA: Insufficient documentation

## 2022-04-09 DIAGNOSIS — O133 Gestational [pregnancy-induced] hypertension without significant proteinuria, third trimester: Secondary | ICD-10-CM | POA: Diagnosis not present

## 2022-04-09 DIAGNOSIS — O34219 Maternal care for unspecified type scar from previous cesarean delivery: Secondary | ICD-10-CM

## 2022-04-09 DIAGNOSIS — Z3A31 31 weeks gestation of pregnancy: Secondary | ICD-10-CM

## 2022-04-09 DIAGNOSIS — O99213 Obesity complicating pregnancy, third trimester: Secondary | ICD-10-CM

## 2022-04-09 DIAGNOSIS — E669 Obesity, unspecified: Secondary | ICD-10-CM

## 2022-04-09 NOTE — Procedures (Signed)
Catherine Christian 10/18/1990 [redacted]w[redacted]d  Fetus A Non-Stress Test Interpretation for 04/09/22  Indication: IUGR and Gestational Diabetes medication controlled  Fetal Heart Rate A Mode: External Baseline Rate (A): 135 bpm Variability: Moderate Accelerations: 15 x 15 Decelerations: None Multiple birth?: No  Uterine Activity Mode: Palpation, Toco Contraction Frequency (min): Occas Contraction Quality: Mild Resting Tone Palpated: Relaxed Resting Time: Adequate  Interpretation (Fetal Testing) Nonstress Test Interpretation: Reactive Comments: Dr. Parke Poisson reviewed tracing.

## 2022-04-15 ENCOUNTER — Ambulatory Visit: Payer: 59 | Attending: Maternal & Fetal Medicine

## 2022-04-15 ENCOUNTER — Ambulatory Visit: Payer: 59 | Admitting: *Deleted

## 2022-04-15 VITALS — BP 112/58 | HR 91

## 2022-04-15 DIAGNOSIS — O09293 Supervision of pregnancy with other poor reproductive or obstetric history, third trimester: Secondary | ICD-10-CM

## 2022-04-15 DIAGNOSIS — E669 Obesity, unspecified: Secondary | ICD-10-CM

## 2022-04-15 DIAGNOSIS — O365931 Maternal care for other known or suspected poor fetal growth, third trimester, fetus 1: Secondary | ICD-10-CM | POA: Insufficient documentation

## 2022-04-15 DIAGNOSIS — O365930 Maternal care for other known or suspected poor fetal growth, third trimester, not applicable or unspecified: Secondary | ICD-10-CM | POA: Insufficient documentation

## 2022-04-15 DIAGNOSIS — O24415 Gestational diabetes mellitus in pregnancy, controlled by oral hypoglycemic drugs: Secondary | ICD-10-CM | POA: Diagnosis not present

## 2022-04-15 DIAGNOSIS — O133 Gestational [pregnancy-induced] hypertension without significant proteinuria, third trimester: Secondary | ICD-10-CM | POA: Diagnosis not present

## 2022-04-15 DIAGNOSIS — O3113X1 Continuing pregnancy after spontaneous abortion of one fetus or more, third trimester, fetus 1: Secondary | ICD-10-CM | POA: Diagnosis not present

## 2022-04-15 DIAGNOSIS — O24419 Gestational diabetes mellitus in pregnancy, unspecified control: Secondary | ICD-10-CM | POA: Insufficient documentation

## 2022-04-15 DIAGNOSIS — Z3A32 32 weeks gestation of pregnancy: Secondary | ICD-10-CM

## 2022-04-15 DIAGNOSIS — O99213 Obesity complicating pregnancy, third trimester: Secondary | ICD-10-CM

## 2022-04-15 DIAGNOSIS — O34219 Maternal care for unspecified type scar from previous cesarean delivery: Secondary | ICD-10-CM

## 2022-04-23 ENCOUNTER — Ambulatory Visit: Payer: Medicaid Other | Attending: Maternal & Fetal Medicine

## 2022-04-23 ENCOUNTER — Ambulatory Visit: Payer: Medicaid Other | Admitting: *Deleted

## 2022-04-23 ENCOUNTER — Encounter: Payer: Self-pay | Admitting: *Deleted

## 2022-04-23 DIAGNOSIS — O24419 Gestational diabetes mellitus in pregnancy, unspecified control: Secondary | ICD-10-CM | POA: Insufficient documentation

## 2022-04-23 DIAGNOSIS — O365931 Maternal care for other known or suspected poor fetal growth, third trimester, fetus 1: Secondary | ICD-10-CM | POA: Insufficient documentation

## 2022-04-23 DIAGNOSIS — E669 Obesity, unspecified: Secondary | ICD-10-CM

## 2022-04-23 DIAGNOSIS — O99213 Obesity complicating pregnancy, third trimester: Secondary | ICD-10-CM

## 2022-04-23 DIAGNOSIS — O3110X1 Continuing pregnancy after spontaneous abortion of one fetus or more, unspecified trimester, fetus 1: Secondary | ICD-10-CM | POA: Diagnosis not present

## 2022-04-23 DIAGNOSIS — O09293 Supervision of pregnancy with other poor reproductive or obstetric history, third trimester: Secondary | ICD-10-CM

## 2022-04-23 DIAGNOSIS — O24415 Gestational diabetes mellitus in pregnancy, controlled by oral hypoglycemic drugs: Secondary | ICD-10-CM

## 2022-04-23 DIAGNOSIS — O133 Gestational [pregnancy-induced] hypertension without significant proteinuria, third trimester: Secondary | ICD-10-CM | POA: Diagnosis not present

## 2022-04-23 DIAGNOSIS — Z3A33 33 weeks gestation of pregnancy: Secondary | ICD-10-CM

## 2022-04-23 DIAGNOSIS — O34219 Maternal care for unspecified type scar from previous cesarean delivery: Secondary | ICD-10-CM

## 2022-04-24 ENCOUNTER — Encounter (INDEPENDENT_AMBULATORY_CARE_PROVIDER_SITE_OTHER): Payer: Self-pay

## 2022-04-30 ENCOUNTER — Other Ambulatory Visit: Payer: Self-pay | Admitting: *Deleted

## 2022-04-30 ENCOUNTER — Ambulatory Visit: Payer: Medicaid Other | Attending: Obstetrics and Gynecology

## 2022-04-30 ENCOUNTER — Encounter: Payer: Self-pay | Admitting: *Deleted

## 2022-04-30 ENCOUNTER — Ambulatory Visit: Payer: Medicaid Other | Admitting: *Deleted

## 2022-04-30 DIAGNOSIS — Z6834 Body mass index (BMI) 34.0-34.9, adult: Secondary | ICD-10-CM | POA: Diagnosis present

## 2022-04-30 DIAGNOSIS — O24410 Gestational diabetes mellitus in pregnancy, diet controlled: Secondary | ICD-10-CM | POA: Insufficient documentation

## 2022-04-30 DIAGNOSIS — E669 Obesity, unspecified: Secondary | ICD-10-CM

## 2022-04-30 DIAGNOSIS — O24415 Gestational diabetes mellitus in pregnancy, controlled by oral hypoglycemic drugs: Secondary | ICD-10-CM

## 2022-04-30 DIAGNOSIS — O365931 Maternal care for other known or suspected poor fetal growth, third trimester, fetus 1: Secondary | ICD-10-CM

## 2022-04-30 DIAGNOSIS — O99213 Obesity complicating pregnancy, third trimester: Secondary | ICD-10-CM

## 2022-04-30 DIAGNOSIS — O3110X1 Continuing pregnancy after spontaneous abortion of one fetus or more, unspecified trimester, fetus 1: Secondary | ICD-10-CM

## 2022-04-30 DIAGNOSIS — O133 Gestational [pregnancy-induced] hypertension without significant proteinuria, third trimester: Secondary | ICD-10-CM

## 2022-04-30 DIAGNOSIS — O09293 Supervision of pregnancy with other poor reproductive or obstetric history, third trimester: Secondary | ICD-10-CM

## 2022-04-30 DIAGNOSIS — O34219 Maternal care for unspecified type scar from previous cesarean delivery: Secondary | ICD-10-CM

## 2022-04-30 DIAGNOSIS — Z3A34 34 weeks gestation of pregnancy: Secondary | ICD-10-CM

## 2022-05-02 ENCOUNTER — Other Ambulatory Visit: Payer: 59

## 2022-05-02 ENCOUNTER — Ambulatory Visit: Payer: 59

## 2022-05-08 ENCOUNTER — Ambulatory Visit: Payer: 59 | Attending: Obstetrics and Gynecology

## 2022-05-08 ENCOUNTER — Encounter: Payer: Self-pay | Admitting: *Deleted

## 2022-05-08 ENCOUNTER — Ambulatory Visit: Payer: 59 | Admitting: *Deleted

## 2022-05-08 ENCOUNTER — Other Ambulatory Visit: Payer: Self-pay | Admitting: Obstetrics and Gynecology

## 2022-05-08 VITALS — BP 121/69 | HR 100

## 2022-05-08 DIAGNOSIS — Z6834 Body mass index (BMI) 34.0-34.9, adult: Secondary | ICD-10-CM

## 2022-05-08 DIAGNOSIS — O24410 Gestational diabetes mellitus in pregnancy, diet controlled: Secondary | ICD-10-CM | POA: Diagnosis present

## 2022-05-08 DIAGNOSIS — O24415 Gestational diabetes mellitus in pregnancy, controlled by oral hypoglycemic drugs: Secondary | ICD-10-CM | POA: Insufficient documentation

## 2022-05-11 LAB — OB RESULTS CONSOLE GBS: GBS: NEGATIVE

## 2022-05-14 ENCOUNTER — Ambulatory Visit: Payer: 59 | Attending: Obstetrics | Admitting: *Deleted

## 2022-05-14 ENCOUNTER — Ambulatory Visit (HOSPITAL_BASED_OUTPATIENT_CLINIC_OR_DEPARTMENT_OTHER): Payer: 59

## 2022-05-14 VITALS — BP 118/65 | HR 92

## 2022-05-14 DIAGNOSIS — O99214 Obesity complicating childbirth: Secondary | ICD-10-CM | POA: Insufficient documentation

## 2022-05-14 DIAGNOSIS — Z3A36 36 weeks gestation of pregnancy: Secondary | ICD-10-CM | POA: Insufficient documentation

## 2022-05-14 DIAGNOSIS — O1213 Gestational proteinuria, third trimester: Secondary | ICD-10-CM | POA: Diagnosis not present

## 2022-05-14 DIAGNOSIS — O365930 Maternal care for other known or suspected poor fetal growth, third trimester, not applicable or unspecified: Secondary | ICD-10-CM

## 2022-05-14 DIAGNOSIS — O3110X1 Continuing pregnancy after spontaneous abortion of one fetus or more, unspecified trimester, fetus 1: Secondary | ICD-10-CM

## 2022-05-14 DIAGNOSIS — O24415 Gestational diabetes mellitus in pregnancy, controlled by oral hypoglycemic drugs: Secondary | ICD-10-CM

## 2022-05-14 DIAGNOSIS — O99213 Obesity complicating pregnancy, third trimester: Secondary | ICD-10-CM

## 2022-05-14 DIAGNOSIS — O133 Gestational [pregnancy-induced] hypertension without significant proteinuria, third trimester: Secondary | ICD-10-CM | POA: Insufficient documentation

## 2022-05-14 DIAGNOSIS — E669 Obesity, unspecified: Secondary | ICD-10-CM

## 2022-05-14 DIAGNOSIS — O34219 Maternal care for unspecified type scar from previous cesarean delivery: Secondary | ICD-10-CM

## 2022-05-14 DIAGNOSIS — O09293 Supervision of pregnancy with other poor reproductive or obstetric history, third trimester: Secondary | ICD-10-CM | POA: Insufficient documentation

## 2022-05-14 DIAGNOSIS — O321XX0 Maternal care for breech presentation, not applicable or unspecified: Secondary | ICD-10-CM | POA: Insufficient documentation

## 2022-05-15 ENCOUNTER — Encounter (HOSPITAL_COMMUNITY): Payer: Self-pay

## 2022-05-15 NOTE — Patient Instructions (Signed)
Catherine Christian  05/15/2022   Your procedure is scheduled on:  05/28/2022  Arrive at 1030 at Entrance C on [employer] at [facility]  and [employer]. You are invited to use the FREE valet parking or use the Visitor's parking deck.  Pick up the phone at the desk and dial [phone].  Call this number if you have problems the morning of surgery: [phone]  Remember:   Do not eat food:(After Midnight) Desps de medianoche.  Do not drink clear liquids: (After Midnight) Desps de medianoche.  Take these medicines the morning of surgery with A SIP OF WATER:  none   Do not wear jewelry, make-up or nail polish.  Do not wear lotions, powders, or perfumes. Do not wear deodorant.  Do not shave 48 hours prior to surgery.  Do not bring valuables to the hospital.  [facility] is not   responsible for any belongings or valuables brought to the hospital.  Contacts, dentures or bridgework may not be worn into surgery.  Leave suitcase in the car. After surgery it may be brought to your room.  For patients admitted to the hospital, checkout time is 11:00 AM the day of              discharge.      Please read over the following fact sheets that you were given:     Preparing for Surgery

## 2022-05-19 NOTE — H&P (Signed)
HPI: 31 y.o. G2P1001 @ [redacted]w[redacted]d estimated gestational age (as dated LMP c/w 8 week ultrasound) presents for repeat cesarean section for Class A2DM (not optimally controlled).  Patient is currently taking Metformin 1000mg  BID and Lantus 10u qHS for blood sugar management.   Leakage of fluid:  No Vaginal bleeding:  No Contractions:  No Fetal movement:  Yes  Prenatal care has been provided by Dr. Wellington Hampshire. Delora Fuel [facility]).  ROS:  Denies fevers, chills, chest pain, visual changes, SOB, RUQ/epigastric pain, N/V, dysuria, hematuria, or sudden onset/worsening bilateral LE or facial edema.  Pregnancy complicated by: Diamniotic/Dichorionic twin gestation with IUFD of Twin A at 10 weeks Previous fetal growth restriction (resolved -- see growth [place] below) Fetal malpresentation -- breech  Class A2 DM (Metformin 1000mg  BID, Lantus 10u qHS, not optimally controlled)  History of CS x 1 (in 2021 due to suspected LGA, desires repeat) Rh negative (s/p Rhogam x 3 this pregnancy) Obesity (BMI 34) Situational depression (has adequate familial/church support, no medications at this time) History of preeclampsia without SF (has been on low-dose ASA prophylaxis this pregnancy) History of gestational diabetes  Prenatal Transfer Tool  Maternal Diabetes: Yes:  Diabetes Type:  Insulin/Medication controlled Genetic Screening: Normal: Panorama Low risk female x 2 Maternal Ultrasounds/Referrals: IUGR (resolved) Fetal Ultrasounds or other Referrals:  Referred to Materal Fetal Medicine  Maternal Substance Abuse:  No Significant Maternal Medications:  Meds include: Other: Metformin 1000mg  BID and Lantus 10u qHS  Significant Maternal Lab Results: Group B Strep negative   Prenatal Labs Blood type:  A Negative Antibody screen:  Negative CBC:  H/H 13.0/37.9 Rubella: Immune RPR:  Non-reactive Hep B:  Negative Hep C:  Negative HIV:  Negative GC/CT:  Negative Glucola:  152.9 (abnormal), 3h GTT  abnormal  Immunizations: Tdap: Given prenatally Flu: Received last flu season  OBHx:  OB History     Gravida  2   Para  1   Term  1   Preterm  0   AB  0   Living  1      SAB  0   IAB  0   Ectopic  0   Multiple  0   Live Births  1          PMHx:  See above Meds:  PNV, ASA daily Allergy:   Allergies  Allergen Reactions   Cabbage Nausea Only   SurgHx: Cesarean section x 1, cholecystectomy, appendectomy, thyroglossal duct cyst SocHx:   Denies Tobacco, ETOH, illicit drugs  O: LMP 0000000  Gen. AAOx3, NAD CV.  RRR  Resp. CTAB, no wheezes/rales/rhonchi Abd. Gravid, soft, non-tender throughout, no rebound/guarding Extr.  Trace bilateral LE edema, no calf tenderness bilaterally   Last [place]:   Narrative & Impression  ----------------------------------------------------------------------  OBSTETRICS REPORT                       (Signed Final 05/23/2022 08:11 am) ---------------------------------------------------------------------- Patient Info  ID #:       [id number]                          D.O.B.:  [birth year] (31 yrs)  Name:       Catherine Christian                     Visit Date: 05/23/2022 07:36 am              Catherine Christian ---------------------------------------------------------------------- Performed By  Attending:        Tama High MD        Ref. Address:     [facility]                                                             [address]                                                             [address] [place], [place]  Performed By:     Jacob Moores BS,       Location:         [facility]                    RD[facility], [facility]T                                [facility] at                                                             [place] [facility]                                                             Women  Referred  By:      Drema Dallas ---------------------------------------------------------------------- Orders  #  Description                           Code        Ordered By  1  [place] MFM FETAL BPP WO NON               [id number]    YU FANG     STRESS ----------------------------------------------------------------------  #  Order #                     Accession #  Episode #  1  [id number]                   [id number]                 [id number] ---------------------------------------------------------------------- Indications  [redacted] weeks gestation of pregnancy                Z3A.37  Gestational diabetes in pregnancy,             O24.415  controlled by oral hypoglycemic drugs  (metformin)  Maternal care for known or suspected poor      O36.5930  fetal growth, third trimester, not applicable or  unspecified IUGR  Gestational hypertension without significant   O13.3  proteinuria, third trimester  Twin pregnancy with loss (demise) of one       O62.009, O31.10X0  fetus, antepartum  Obesity complicating pregnancy, third          O99.213  trimester (BMI 34)  Poor obstetric history: Previous gestational   O09.299  diabetes  History of cesarean delivery, currently        O34.219  pregnant  Low Risk NIPS - Female  Encounter for other antenatal screening        Z36.2  follow-up ---------------------------------------------------------------------- Fetal Evaluation  Num Of Fetuses:         1  Fetal Heart Rate(bpm):  134  Cardiac Activity:       Observed  Presentation:           Breech  Placenta:               Posterior  P. Cord Insertion:      Previously Visualized  Amniotic Fluid  AFI FV:      Within normal limits  AFI Sum(cm)     %Tile       Largest Pocket(cm)  11.75           38          3.5  RUQ(cm)       RLQ(cm)       LUQ(cm)        LLQ(cm)  3.21          2.57          3.5            2.48 ---------------------------------------------------------------------- Biophysical  Evaluation  Amniotic F.V:   Pocket => 2 cm             F. Tone:        Observed  F. Movement:    Observed                   Score:          8/8  F. Breathing:   Observed ---------------------------------------------------------------------- OB History  Gravidity:    2         Term:   1        Prem:   0        SAB:   0  TOP:          0       Ectopic:  0        Living: 1 ---------------------------------------------------------------------- Gestational Age  LMP:           37w 5d        Date:  09/01/21                 EDD:   06/08/22  Best:  37w 5d     Det. By:  LMP  (09/01/21)          EDD:   06/08/22 ---------------------------------------------------------------------- Anatomy  Cranium:               Previously seen        LVOT:                   Previously seen  Cavum:                 Previously seen        Aortic Arch:            Previously seen  Ventricles:            Appears normal         Ductal Arch:            Previously seen  Choroid Plexus:        Previously seen        Diaphragm:              Appears normal  Cerebellum:            Previously seen        Stomach:                Appears normal, left                                                                        sided  Posterior Fossa:       Previously seen        Abdomen:                Previously seen  Nuchal Fold:           Not applicable (>20    Abdominal Wall:         Previously seen                         wks GA)  Face:                  Orbits and profile     Cord Vessels:           Previously seen                         previously seen  Lips:                  Previously seen        Kidneys:                Appear normal  Palate:                Previously seen        Bladder:                Appears normal  Thoracic:              Appears normal         Spine:                  Previously seen  Heart:  Previously seen        Upper Extremities:      Previously seen  RVOT:                  Previously  seen        Lower Extremities:      Previously seen  Other:  Technically difficult due to maternal habitus and fetal position. 3VV          and 3VTV previously visualized. VC, nasal bone, lenses previously          visualized. Female gender previously seen. ---------------------------------------------------------------------- Cervix Uterus Adnexa  Cervix  Not visualized (advanced GA >24wks)  Uterus  No abnormality visualized.  Right Ovary  Within normal limits.  Left Ovary  Within normal limits.  Cul De Sac  No free fluid seen.  Adnexa  No abnormality visualized. ---------------------------------------------------------------------- Impression  Gestational diabetes.  Patient started taking insulin and  reports her blood glucose levels are within normal range.  She is scheduled to undergo repeat cesarean delivery next  week.  Amniotic fluid is normal and good fetal activity seen.  Antenatal testing is reassuring.  BPP 8/8.  Breech  presentation. ----------------------------------------------------------------------                 Tama High, MD Electronically Signed Final Report   05/23/2022 08:11 am ----------------------------------------------------------------------   Last growth [place] (04/30/22): Narrative & Impression  ----------------------------------------------------------------------  OBSTETRICS REPORT                       (Signed Final 04/30/2022 08:54 am) ---------------------------------------------------------------------- Patient Info  ID #:       [id number]                          D.O.B.:  24-Jun-1991 (31 yrs)  Name:       Catherine Christian                     Visit Date: 04/30/2022 07:34 am              Catherine Christian ---------------------------------------------------------------------- Performed By  Attending:        Johnell Comings MD         Ref. Address:     [facility]                                                             [address]                                                              [address] [place], [place]  [address]  Performed By:     Burt Knack RDMS     Location:         [facility]                                                             [facility] at                                                             [facility]                                                             [facility]  Referred By:      Steva Ready ---------------------------------------------------------------------- Orders  #  Description                           Code        Ordered By  1  [place] MFM UA CORD DOPPLER                76820.02    RAVI SHANKAR  2  [place] MFM OB FOLLOW UP                   76816.01    RAVI SHANKAR  3  [place] MFM FETAL BPP WO NON               76819.01    RA[facility]     STRESS ----------------------------------------------------------------------  #  Order #                     Accession #                Episode #  1  [id number]                   [id number]                 [id number]  2  [id number]                   [id number]                 [id number]  3  [id number]                   [id number]                 [id number] ---------------------------------------------------------------------- Indications  Gestational diabetes in pregnancy,             O24.415  controlled by oral hypoglycemic drugs  (metformin)  Maternal care for known or suspected poor      O36.5930  fetal growth, third trimester, not applicable or  unspecified IUGR  Gestational hypertension without significant   O13.3  proteinuria, third trimester  [redacted] weeks gestation of pregnancy  Z3A.34  Twin pregnancy with loss (demise) of one       O43.009, O31.10X0  fetus, antepartum  Obesity complicating pregnancy, third          O99.213  trimester (BMI 34)  Poor obstetric history: Previous gestational   O09.299  diabetes  History  of cesarean delivery, currently        O34.219  pregnant  Low Risk NIPS - Female ---------------------------------------------------------------------- Vital Signs                            Pulse:  89  BP:          114/60 ---------------------------------------------------------------------- Fetal Evaluation  Num Of Fetuses:         1  Fetal Heart Rate(bpm):  162  Cardiac Activity:       Observed  Presentation:           Cephalic  Placenta:               Posterior  P. Cord Insertion:      Previously Visualized  Amniotic Fluid  AFI FV:      Subjectively low-normal  AFI Sum(cm)     %Tile       Largest Pocket(cm)  7.93            4.8         5.07  RUQ(cm)       RLQ(cm)       LUQ(cm)        LLQ(cm)  1.49          5.07          0              1.37 ---------------------------------------------------------------------- Biophysical Evaluation  Amniotic F.V:   Within normal limits       F. Tone:        Observed  F. Movement:    Observed                   Score:          8/8  F. Breathing:   Observed ---------------------------------------------------------------------- Biometry  BPD:     77.93  mm     G. Age:  31w 2d        < 1  %    CI:        63.88   %    70 - 86                                                          FL/HC:      19.7   %    19.4 - 21.8  HC:    313.89   mm     G. Age:  35w 1d         33  %    HC/AC:      1.06        0.96 - 1.11  AC:      296.1  mm     G. Age:  33w 4d         30  %    FL/BPD:     79.3   %    71 - 87  FL:  61.8  mm     G. Age:  32w 0d        2.6  %    FL/AC:      20.9   %    20 - 24  Est. FW:    2117  gm    4 lb 11 oz      13  % ---------------------------------------------------------------------- OB History  Gravidity:    2         Term:   1        Prem:   0        SAB:   0  TOP:          0       Ectopic:  0        Living: 1 ---------------------------------------------------------------------- Gestational Age  LMP:           34w 3d         Date:  09/01/21                 EDD:   06/08/22  U/S Today:     33w 0d                                        EDD:   06/18/22  Best:          34w 3d     Det. By:  LMP  (09/01/21)          EDD:   06/08/22 ---------------------------------------------------------------------- Doppler - Fetal Vessels  Umbilical Artery   S/D     %tile      RI    %tile      PI    %tile     PSV    ADFV    RDFV                                                     (cm/s)   2.49       49     0.6       56     0.9       59    29.77      No      No ---------------------------------------------------------------------- Comments  This patient was seen due to borderline IUGR with low  normal amniotic fluid.  She denies any problems since her  last exam.  Her pregnancy has also been complicated by gestational  diabetes treated with metformin.  A BPP performed today was 8 out of 8.  Low normal amniotic fluid with a total AFI of 7.93 cm was  noted today.  This is a stable finding as compared to her prior  exams.  Doppler studies of the umbilical arteries performed due to  fetal growth restriction showed a normal S/D ratio of 2.49.  There were no signs of absent or reversed end-diastolic flow  noted today.  She will return in 1 week for another BPP, umbilical artery  Doppler study, and amniotic fluid check. ----------------------------------------------------------------------                   Johnell Comings, MD Electronically Signed Final Report   04/30/2022 08:54 am ----------------------------------------------------------------------    Labs: see orders  A/P:  31 y.o. G2P1001 @ [redacted]w[redacted]d who presents for repeat cesarean section for Class A2DM (not optimally controlled).   - Admit to L&D - Admit labs (CBC, T&S, COVID screen per protocol) - CBG q4h with SSI - CEFM/Toco - Diet:  NPO - IVF:  Per routine - VTE Prophylaxis:  SCDs - GBS Status:  Negative - Presentation:  Breech on last [place] on 9/7 - Antibiotics:  Ancef 2g  on call to OR - Shohl's on call to OR - Consents signed and witnessed   Consents: I have explained to the patient that this surgery is performed to deliver their baby or babies through an incision in the abdomen and incision in the uterus.  Prior to surgery, the risks and benefits of the surgery, as well as alternative treatments were discussed.  The risks include, but are not limited to, possible need for cesarean delivery for all future pregnancies, bleeding at the time of surgery that could necessitate a blood transfusion and/or hysterectomy, rupture of the uterus during a future pregnancy that could cause a preterm delivery and/or requiring hysterectomy, infection, damage to surrounding organs and tissues, damage to bladder, damage to ureters, causing kidney damage, and requiring additional procedures, damage to bowels, resulting in further surgery, postoperative pain, short-term and long-term, scarring on the abdominal wall and intra-abdominally, need for further surgery, development of an incisional hernia, deep vein thrombosis and/or pulmonary embolism, wound infection and/or separation, painful intercourse, urinary leakage, impact on future pregnancies including but not limited to, abnormal location or attachment of the placenta to the uterus, such as placenta previa or accreta, that may necessitate a blood transfusion and/or hysterectomy, impact on total family size, complications the course of which cannot be predicted or prevented, and death. Patient was consented for blood products.  The patient is aware that bleeding may result in the need for a blood transfusion which includes risk of transmission of HIV (1:2 million), Hepatitis C (1:2 million), and Hepatitis B (1:200 thousand) and transfusion reaction.  Patient voiced understanding of the above risks as well as understanding of indications for blood transfusion.   Drema Dallas, DO [phone] (office)

## 2022-05-23 ENCOUNTER — Ambulatory Visit: Payer: Medicaid Other | Attending: Obstetrics

## 2022-05-23 ENCOUNTER — Ambulatory Visit: Payer: Medicaid Other | Admitting: *Deleted

## 2022-05-23 VITALS — BP 113/61 | HR 97

## 2022-05-23 DIAGNOSIS — O365930 Maternal care for other known or suspected poor fetal growth, third trimester, not applicable or unspecified: Secondary | ICD-10-CM | POA: Diagnosis not present

## 2022-05-23 DIAGNOSIS — O99213 Obesity complicating pregnancy, third trimester: Secondary | ICD-10-CM

## 2022-05-23 DIAGNOSIS — O09293 Supervision of pregnancy with other poor reproductive or obstetric history, third trimester: Secondary | ICD-10-CM

## 2022-05-23 DIAGNOSIS — O133 Gestational [pregnancy-induced] hypertension without significant proteinuria, third trimester: Secondary | ICD-10-CM | POA: Diagnosis not present

## 2022-05-23 DIAGNOSIS — O24414 Gestational diabetes mellitus in pregnancy, insulin controlled: Secondary | ICD-10-CM

## 2022-05-23 DIAGNOSIS — O34219 Maternal care for unspecified type scar from previous cesarean delivery: Secondary | ICD-10-CM

## 2022-05-23 DIAGNOSIS — E669 Obesity, unspecified: Secondary | ICD-10-CM

## 2022-05-23 DIAGNOSIS — O24415 Gestational diabetes mellitus in pregnancy, controlled by oral hypoglycemic drugs: Secondary | ICD-10-CM | POA: Diagnosis present

## 2022-05-23 DIAGNOSIS — Z3A37 37 weeks gestation of pregnancy: Secondary | ICD-10-CM

## 2022-05-23 DIAGNOSIS — O3113X0 Continuing pregnancy after spontaneous abortion of one fetus or more, third trimester, not applicable or unspecified: Secondary | ICD-10-CM | POA: Diagnosis not present

## 2022-05-27 ENCOUNTER — Encounter (HOSPITAL_COMMUNITY)
Admission: RE | Admit: 2022-05-27 | Discharge: 2022-05-27 | Disposition: A | Discharge status: Home / Self Care | Payer: Medicaid Other | Source: Ambulatory Visit | Attending: Obstetrics and Gynecology | Admitting: Obstetrics and Gynecology

## 2022-05-27 VITALS — Ht 64.0 in | Wt 235.0 lb

## 2022-05-27 DIAGNOSIS — Z3A00 Weeks of gestation of pregnancy not specified: Secondary | ICD-10-CM | POA: Diagnosis not present

## 2022-05-27 DIAGNOSIS — O133 Gestational [pregnancy-induced] hypertension without significant proteinuria, third trimester: Secondary | ICD-10-CM

## 2022-05-27 DIAGNOSIS — Z01812 Encounter for preprocedural laboratory examination: Secondary | ICD-10-CM | POA: Diagnosis present

## 2022-05-27 LAB — CBC
HCT: 41.4 % (ref 36.0–46.0)
Hemoglobin: 14.1 g/dL (ref 12.0–15.0)
MCH: 30.7 pg (ref 26.0–34.0)
MCHC: 34.1 g/dL (ref 30.0–36.0)
MCV: 90 fL (ref 80.0–100.0)
Platelets: 202 10*3/uL (ref 150–400)
RBC: 4.6 MIL/uL (ref 3.87–5.11)
RDW: 12.7 % (ref 11.5–15.5)
WBC: 7.5 10*3/uL (ref 4.0–10.5)
nRBC: 0 % (ref 0.0–0.2)

## 2022-05-27 LAB — RPR: RPR Ser Ql: NONREACTIVE

## 2022-05-27 LAB — TYPE AND SCREEN
ABO/RH(D): A NEG
Antibody Screen: NEGATIVE

## 2022-05-28 ENCOUNTER — Inpatient Hospital Stay (HOSPITAL_COMMUNITY): Payer: Medicaid Other | Admitting: Anesthesiology

## 2022-05-28 ENCOUNTER — Inpatient Hospital Stay (HOSPITAL_COMMUNITY)
Admission: AD | Admit: 2022-05-28 | Discharge: 2022-05-30 | DRG: 788 | Disposition: A | Discharge status: Home / Self Care | Payer: Medicaid Other | Attending: Obstetrics and Gynecology | Admitting: Obstetrics and Gynecology

## 2022-05-28 ENCOUNTER — Encounter (HOSPITAL_COMMUNITY)
Admission: AD | Disposition: A | Discharge status: Home / Self Care | Payer: Self-pay | Source: Home / Self Care | Attending: Obstetrics and Gynecology

## 2022-05-28 ENCOUNTER — Encounter (HOSPITAL_COMMUNITY): Payer: Self-pay | Admitting: Obstetrics and Gynecology

## 2022-05-28 ENCOUNTER — Ambulatory Visit: Payer: 59

## 2022-05-28 ENCOUNTER — Other Ambulatory Visit: Payer: Self-pay

## 2022-05-28 DIAGNOSIS — O34219 Maternal care for unspecified type scar from previous cesarean delivery: Secondary | ICD-10-CM

## 2022-05-28 DIAGNOSIS — O26893 Other specified pregnancy related conditions, third trimester: Secondary | ICD-10-CM | POA: Diagnosis present

## 2022-05-28 DIAGNOSIS — Z6791 Unspecified blood type, Rh negative: Secondary | ICD-10-CM

## 2022-05-28 DIAGNOSIS — Z3A38 38 weeks gestation of pregnancy: Secondary | ICD-10-CM

## 2022-05-28 DIAGNOSIS — O164 Unspecified maternal hypertension, complicating childbirth: Secondary | ICD-10-CM

## 2022-05-28 DIAGNOSIS — O30043 Twin pregnancy, dichorionic/diamniotic, third trimester: Secondary | ICD-10-CM

## 2022-05-28 DIAGNOSIS — O364XX1 Maternal care for intrauterine death, fetus 1: Secondary | ICD-10-CM | POA: Diagnosis not present

## 2022-05-28 DIAGNOSIS — O34211 Maternal care for low transverse scar from previous cesarean delivery: Secondary | ICD-10-CM | POA: Diagnosis present

## 2022-05-28 DIAGNOSIS — E559 Vitamin D deficiency, unspecified: Secondary | ICD-10-CM

## 2022-05-28 DIAGNOSIS — O321XX0 Maternal care for breech presentation, not applicable or unspecified: Secondary | ICD-10-CM | POA: Diagnosis present

## 2022-05-28 DIAGNOSIS — O99214 Obesity complicating childbirth: Secondary | ICD-10-CM | POA: Diagnosis present

## 2022-05-28 DIAGNOSIS — O24414 Gestational diabetes mellitus in pregnancy, insulin controlled: Principal | ICD-10-CM

## 2022-05-28 DIAGNOSIS — O24424 Gestational diabetes mellitus in childbirth, insulin controlled: Secondary | ICD-10-CM | POA: Diagnosis present

## 2022-05-28 DIAGNOSIS — O3121X2 Continuing pregnancy after intrauterine death of one fetus or more, first trimester, fetus 2: Secondary | ICD-10-CM | POA: Diagnosis not present

## 2022-05-28 HISTORY — DX: Gestational diabetes mellitus in pregnancy, insulin controlled: O24.414

## 2022-05-28 LAB — CBC
HCT: 35.7 % — ABNORMAL LOW (ref 36.0–46.0)
Hemoglobin: 12.8 g/dL (ref 12.0–15.0)
MCH: 31.5 pg (ref 26.0–34.0)
MCHC: 35.9 g/dL (ref 30.0–36.0)
MCV: 87.9 fL (ref 80.0–100.0)
Platelets: 172 10*3/uL (ref 150–400)
RBC: 4.06 MIL/uL (ref 3.87–5.11)
RDW: 12.5 % (ref 11.5–15.5)
WBC: 10.8 10*3/uL — ABNORMAL HIGH (ref 4.0–10.5)
nRBC: 0 % (ref 0.0–0.2)

## 2022-05-28 LAB — CREATININE, SERUM
Creatinine, Ser: 0.54 mg/dL (ref 0.44–1.00)
GFR, Estimated: >60 mL/min (ref >60)

## 2022-05-28 LAB — GLUCOSE, CAPILLARY
Glucose-Capillary: 83 mg/dL (ref 70–99)
Glucose-Capillary: 86 mg/dL (ref 70–99)

## 2022-05-28 SURGERY — Surgical Case
Anesthesia: Spinal

## 2022-05-28 MED ORDER — SODIUM CHLORIDE 0.9% FLUSH
INTRAVENOUS | Status: DC | PRN
  Administered 2022-05-28: 50 mL via INTRAVENOUS

## 2022-05-28 MED ORDER — MEPERIDINE HCL 25 MG/ML IJ SOLN: 6.2500 mg | INTRAMUSCULAR | Status: DC | PRN

## 2022-05-28 MED ORDER — METOCLOPRAMIDE HCL 5 MG/ML IJ SOLN
INTRAMUSCULAR | Status: AC
  Filled 2022-05-28: qty 2

## 2022-05-28 MED ORDER — LIDOCAINE HCL (PF) 1 % IJ SOLN
INTRAMUSCULAR | Status: AC
  Filled 2022-05-28: qty 10

## 2022-05-28 MED ORDER — DIPHENHYDRAMINE HCL 50 MG/ML IJ SOLN
INTRAMUSCULAR | Status: DC | PRN
  Administered 2022-05-28: 6.25 mg via INTRAVENOUS

## 2022-05-28 MED ORDER — ACETAMINOPHEN 500 MG PO TABS
1000.0000 mg | ORAL_TABLET | Freq: Four times a day (QID) | ORAL | Status: DC
  Administered 2022-05-28 – 2022-05-30 (×7): 1000 mg via ORAL
  Filled 2022-05-28 (×7): qty 2

## 2022-05-28 MED ORDER — SODIUM CHLORIDE 0.9% FLUSH: 3.0000 mL | INTRAVENOUS | Status: DC | PRN

## 2022-05-28 MED ORDER — ENOXAPARIN SODIUM 60 MG/0.6ML IJ SOSY
50.0000 mg | PREFILLED_SYRINGE | INTRAMUSCULAR | Status: DC
  Administered 2022-05-29 – 2022-05-30 (×2): 50 mg via SUBCUTANEOUS
  Filled 2022-05-28 (×2): qty 0.6

## 2022-05-28 MED ORDER — BUPIVACAINE HCL (PF) 0.25 % IJ SOLN
INTRAMUSCULAR | Status: AC
  Filled 2022-05-28: qty 30

## 2022-05-28 MED ORDER — DIBUCAINE (PERIANAL) 1 % EX OINT: 1.0000 | TOPICAL_OINTMENT | CUTANEOUS | Status: DC | PRN

## 2022-05-28 MED ORDER — IBUPROFEN 600 MG PO TABS
600.0000 mg | ORAL_TABLET | Freq: Four times a day (QID) | ORAL | Status: DC
  Administered 2022-05-29 – 2022-05-30 (×4): 600 mg via ORAL
  Filled 2022-05-28 (×4): qty 1

## 2022-05-28 MED ORDER — KETOROLAC TROMETHAMINE 30 MG/ML IJ SOLN
30.0000 mg | Freq: Four times a day (QID) | INTRAMUSCULAR | Status: AC
  Administered 2022-05-28 – 2022-05-29 (×3): 30 mg via INTRAVENOUS
  Filled 2022-05-28 (×3): qty 1

## 2022-05-28 MED ORDER — NALOXONE HCL 0.4 MG/ML IJ SOLN: 0.4000 mg | INTRAMUSCULAR | Status: DC | PRN

## 2022-05-28 MED ORDER — DEXAMETHASONE SODIUM PHOSPHATE 4 MG/ML IJ SOLN
INTRAMUSCULAR | Status: DC | PRN
  Administered 2022-05-28: 4 mg via INTRAVENOUS

## 2022-05-28 MED ORDER — MORPHINE SULFATE (PF) 0.5 MG/ML IJ SOLN
INTRAMUSCULAR | Status: AC
  Filled 2022-05-28: qty 10

## 2022-05-28 MED ORDER — DIPHENHYDRAMINE HCL 50 MG/ML IJ SOLN: 12.5000 mg | INTRAMUSCULAR | Status: DC | PRN

## 2022-05-28 MED ORDER — MORPHINE SULFATE (PF) 0.5 MG/ML IJ SOLN
INTRAMUSCULAR | Status: DC | PRN
  Administered 2022-05-28: 150 ug via INTRATHECAL

## 2022-05-28 MED ORDER — CEFAZOLIN SODIUM-DEXTROSE 2-4 GM/100ML-% IV SOLN
2.0000 g | INTRAVENOUS | Status: AC
  Administered 2022-05-28: 2 g via INTRAVENOUS

## 2022-05-28 MED ORDER — SCOPOLAMINE 1 MG/3DAYS TD PT72
MEDICATED_PATCH | TRANSDERMAL | Status: AC
  Filled 2022-05-28: qty 1

## 2022-05-28 MED ORDER — ONDANSETRON HCL 4 MG/2ML IJ SOLN
INTRAMUSCULAR | Status: AC
  Filled 2022-05-28: qty 2

## 2022-05-28 MED ORDER — FENTANYL CITRATE (PF) 100 MCG/2ML IJ SOLN
INTRAMUSCULAR | Status: AC
  Filled 2022-05-28: qty 2

## 2022-05-28 MED ORDER — SIMETHICONE 80 MG PO CHEW: 80.0000 mg | CHEWABLE_TABLET | ORAL | Status: DC | PRN

## 2022-05-28 MED ORDER — DIPHENHYDRAMINE HCL 25 MG PO CAPS: 25.0000 mg | ORAL_CAPSULE | ORAL | Status: DC | PRN

## 2022-05-28 MED ORDER — DIPHENHYDRAMINE HCL 25 MG PO CAPS: 25.0000 mg | ORAL_CAPSULE | Freq: Four times a day (QID) | ORAL | Status: DC | PRN

## 2022-05-28 MED ORDER — PHENYLEPHRINE HCL-NACL 20-0.9 MG/250ML-% IV SOLN
INTRAVENOUS | Status: DC | PRN
  Administered 2022-05-28: 60 ug/min via INTRAVENOUS

## 2022-05-28 MED ORDER — INSULIN ASPART 100 UNIT/ML IJ SOLN: 0.0000 [IU] | INTRAMUSCULAR | Status: DC

## 2022-05-28 MED ORDER — PHENYLEPHRINE 80 MCG/ML (10ML) SYRINGE FOR IV PUSH (FOR BLOOD PRESSURE SUPPORT)
PREFILLED_SYRINGE | INTRAVENOUS | Status: AC
  Filled 2022-05-28: qty 10

## 2022-05-28 MED ORDER — SODIUM CHLORIDE 0.9 % IR SOLN
Status: DC | PRN
  Administered 2022-05-28: 1

## 2022-05-28 MED ORDER — CEFAZOLIN SODIUM-DEXTROSE 2-4 GM/100ML-% IV SOLN
INTRAVENOUS | Status: AC
  Filled 2022-05-28: qty 100

## 2022-05-28 MED ORDER — METOCLOPRAMIDE HCL 5 MG/ML IJ SOLN
INTRAMUSCULAR | Status: DC | PRN
  Administered 2022-05-28: 10 mg via INTRAVENOUS

## 2022-05-28 MED ORDER — BUPIVACAINE LIPOSOME 1.3 % IJ SUSP
INTRAMUSCULAR | Status: DC | PRN
  Administered 2022-05-28: 20 mL

## 2022-05-28 MED ORDER — SIMETHICONE 80 MG PO CHEW
80.0000 mg | CHEWABLE_TABLET | Freq: Three times a day (TID) | ORAL | Status: DC
  Administered 2022-05-28 – 2022-05-30 (×6): 80 mg via ORAL
  Filled 2022-05-28 (×6): qty 1

## 2022-05-28 MED ORDER — SENNOSIDES-DOCUSATE SODIUM 8.6-50 MG PO TABS
2.0000 | ORAL_TABLET | Freq: Every day | ORAL | Status: DC
  Administered 2022-05-29 – 2022-05-30 (×2): 2 via ORAL
  Filled 2022-05-28 (×2): qty 2

## 2022-05-28 MED ORDER — SODIUM CHLORIDE (PF) 0.9 % IJ SOLN
INTRAMUSCULAR | Status: AC
  Filled 2022-05-28: qty 10

## 2022-05-28 MED ORDER — OXYTOCIN-SODIUM CHLORIDE 30-0.9 UT/500ML-% IV SOLN
INTRAVENOUS | Status: DC | PRN
  Administered 2022-05-28: 400 mL via INTRAVENOUS

## 2022-05-28 MED ORDER — BUPIVACAINE LIPOSOME 1.3 % IJ SUSP
20.0000 mL | Freq: Once | INTRAMUSCULAR | Status: DC
  Filled 2022-05-28: qty 20

## 2022-05-28 MED ORDER — MENTHOL 3 MG MT LOZG: 1.0000 | LOZENGE | OROMUCOSAL | Status: DC | PRN

## 2022-05-28 MED ORDER — WITCH HAZEL-GLYCERIN EX PADS: 1.0000 | MEDICATED_PAD | CUTANEOUS | Status: DC | PRN

## 2022-05-28 MED ORDER — LACTATED RINGERS IV SOLN: INTRAVENOUS | Status: DC

## 2022-05-28 MED ORDER — SCOPOLAMINE 1 MG/3DAYS TD PT72: 1.0000 | MEDICATED_PATCH | Freq: Once | TRANSDERMAL | Status: DC

## 2022-05-28 MED ORDER — PRENATAL MULTIVITAMIN CH
1.0000 | ORAL_TABLET | Freq: Every day | ORAL | Status: DC
  Administered 2022-05-29 – 2022-05-30 (×2): 1 via ORAL
  Filled 2022-05-28 (×2): qty 1

## 2022-05-28 MED ORDER — KETOROLAC TROMETHAMINE 30 MG/ML IJ SOLN
30.0000 mg | Freq: Four times a day (QID) | INTRAMUSCULAR | Status: DC | PRN
  Administered 2022-05-28: 30 mg via INTRAVENOUS

## 2022-05-28 MED ORDER — ZOLPIDEM TARTRATE 5 MG PO TABS: 5.0000 mg | ORAL_TABLET | Freq: Every evening | ORAL | Status: DC | PRN

## 2022-05-28 MED ORDER — NALOXONE HCL 4 MG/10ML IJ SOLN: 1.0000 ug/kg/h | INTRAVENOUS | Status: DC | PRN

## 2022-05-28 MED ORDER — KETOROLAC TROMETHAMINE 30 MG/ML IJ SOLN: 30.0000 mg | Freq: Four times a day (QID) | INTRAMUSCULAR | Status: DC | PRN

## 2022-05-28 MED ORDER — OXYCODONE HCL 5 MG PO TABS
5.0000 mg | ORAL_TABLET | ORAL | Status: DC | PRN
  Administered 2022-05-29 – 2022-05-30 (×2): 5 mg via ORAL
  Filled 2022-05-28 (×2): qty 1

## 2022-05-28 MED ORDER — ONDANSETRON HCL 4 MG/2ML IJ SOLN: 4.0000 mg | Freq: Three times a day (TID) | INTRAMUSCULAR | Status: DC | PRN

## 2022-05-28 MED ORDER — KETOROLAC TROMETHAMINE 30 MG/ML IJ SOLN
INTRAMUSCULAR | Status: AC
  Filled 2022-05-28: qty 1

## 2022-05-28 MED ORDER — DEXAMETHASONE SODIUM PHOSPHATE 4 MG/ML IJ SOLN
INTRAMUSCULAR | Status: AC
  Filled 2022-05-28: qty 2

## 2022-05-28 MED ORDER — OXYTOCIN-SODIUM CHLORIDE 30-0.9 UT/500ML-% IV SOLN: 2.5000 [IU]/h | INTRAVENOUS | Status: AC

## 2022-05-28 MED ORDER — COCONUT OIL OIL: 1.0000 | TOPICAL_OIL | Status: DC | PRN

## 2022-05-28 MED ORDER — FENTANYL CITRATE (PF) 100 MCG/2ML IJ SOLN
INTRAMUSCULAR | Status: DC | PRN
  Administered 2022-05-28: 15 ug via INTRATHECAL

## 2022-05-28 MED ORDER — SCOPOLAMINE 1 MG/3DAYS TD PT72
1.0000 | MEDICATED_PATCH | TRANSDERMAL | Status: DC
  Administered 2022-05-28: 1.5 mg via TRANSDERMAL

## 2022-05-28 MED ORDER — DEXMEDETOMIDINE (PRECEDEX) IN NS 20 MCG/5ML (4 MCG/ML) IV SYRINGE
PREFILLED_SYRINGE | INTRAVENOUS | Status: DC | PRN
  Administered 2022-05-28: 4 ug via INTRAVENOUS

## 2022-05-28 MED ORDER — SOD CITRATE-CITRIC ACID 500-334 MG/5ML PO SOLN
ORAL | Status: AC
  Filled 2022-05-28: qty 30

## 2022-05-28 MED ORDER — DEXAMETHASONE SODIUM PHOSPHATE 4 MG/ML IJ SOLN
INTRAMUSCULAR | Status: AC
  Filled 2022-05-28: qty 1

## 2022-05-28 MED ORDER — DIPHENHYDRAMINE HCL 50 MG/ML IJ SOLN
INTRAMUSCULAR | Status: AC
  Filled 2022-05-28: qty 1

## 2022-05-28 MED ORDER — ONDANSETRON HCL 4 MG/2ML IJ SOLN
INTRAMUSCULAR | Status: DC | PRN
  Administered 2022-05-28: 4 mg via INTRAVENOUS

## 2022-05-28 MED ORDER — PHENYLEPHRINE HCL-NACL 20-0.9 MG/250ML-% IV SOLN
INTRAVENOUS | Status: AC
  Filled 2022-05-28: qty 250

## 2022-05-28 MED ORDER — SOD CITRATE-CITRIC ACID 500-334 MG/5ML PO SOLN
30.0000 mL | ORAL | Status: AC
  Administered 2022-05-28: 30 mL via ORAL

## 2022-05-28 MED ORDER — MORPHINE SULFATE (PF) 2 MG/ML IV SOLN: 1.0000 mg | INTRAVENOUS | Status: DC | PRN

## 2022-05-28 MED ORDER — BUPIVACAINE IN DEXTROSE 0.75-8.25 % IT SOLN
INTRATHECAL | Status: DC | PRN
  Administered 2022-05-28: 1.55 mL via INTRATHECAL

## 2022-05-28 MED ORDER — BUPIVACAINE HCL (PF) 0.25 % IJ SOLN
INTRAMUSCULAR | Status: DC | PRN
  Administered 2022-05-28: 30 mL

## 2022-05-28 MED ORDER — OXYTOCIN-SODIUM CHLORIDE 30-0.9 UT/500ML-% IV SOLN
INTRAVENOUS | Status: AC
  Filled 2022-05-28: qty 500

## 2022-05-28 MED ORDER — SODIUM CHLORIDE (PF) 0.9 % IJ SOLN
INTRAMUSCULAR | Status: AC
  Filled 2022-05-28: qty 50

## 2022-05-28 SURGICAL SUPPLY — 45 items
BENZOIN TINCTURE PRP APPL 2/3 (GAUZE/BANDAGES/DRESSINGS) ×1 IMPLANT
CANISTER PREVENA PLUS 150 (CANNISTER) IMPLANT
CHLORAPREP W/TINT 26 (MISCELLANEOUS) ×2 IMPLANT
CLAMP CORD UMBIL (MISCELLANEOUS) ×1 IMPLANT
CLOTH BEACON ORANGE TIMEOUT ST (SAFETY) ×1 IMPLANT
DRAPE C SECTION CLR SCREEN (DRAPES) ×1 IMPLANT
DRESSING PREVENA PLUS CUSTOM (GAUZE/BANDAGES/DRESSINGS) IMPLANT
DRSG OPSITE POSTOP 4X10 (GAUZE/BANDAGES/DRESSINGS) ×1 IMPLANT
DRSG PREVENA PLUS CUSTOM (GAUZE/BANDAGES/DRESSINGS) ×1
ELECT REM PT RETURN 9FT ADLT (ELECTROSURGICAL) ×1
ELECTRODE REM PT RTRN 9FT ADLT (ELECTROSURGICAL) ×1 IMPLANT
EXTRACTOR VACUUM KIWI (MISCELLANEOUS) IMPLANT
GAUZE SPONGE 4X4 12PLY STRL LF (GAUZE/BANDAGES/DRESSINGS) IMPLANT
GLOVE BIO SURGEON STRL SZ 6.5 (GLOVE) ×1 IMPLANT
GLOVE BIOGEL PI IND STRL 7.0 (GLOVE) ×2 IMPLANT
GLOVE SURG SS PI 6.5 STRL IVOR (GLOVE) ×1 IMPLANT
GOWN STRL REUS W/ TWL LRG LVL3 (GOWN DISPOSABLE) ×3 IMPLANT
GOWN STRL REUS W/TWL LRG LVL3 (GOWN DISPOSABLE) ×3
KIT ABG SYR 3ML LUER SLIP (SYRINGE) IMPLANT
MAT PREVALON FULL STRYKER (MISCELLANEOUS) IMPLANT
NDL HYPO 25X5/8 SAFETYGLIDE (NEEDLE) IMPLANT
NEEDLE HYPO 22GX1.5 SAFETY (NEEDLE) IMPLANT
NEEDLE HYPO 25X5/8 SAFETYGLIDE (NEEDLE) IMPLANT
NS IRRIG 1000ML POUR BTL (IV SOLUTION) ×1 IMPLANT
PACK C SECTION WH (CUSTOM PROCEDURE TRAY) ×1 IMPLANT
PAD ABD 7.5X8 STRL (GAUZE/BANDAGES/DRESSINGS) IMPLANT
PAD OB MATERNITY 4.3X12.25 (PERSONAL CARE ITEMS) ×1 IMPLANT
RETAINER VISCERAL (MISCELLANEOUS) IMPLANT
RETRACTOR TRAXI PANNICULUS (MISCELLANEOUS) IMPLANT
RTRCTR C-SECT PINK 25CM LRG (MISCELLANEOUS) ×1 IMPLANT
STRIP CLOSURE SKIN 1/2X4 (GAUZE/BANDAGES/DRESSINGS) ×1 IMPLANT
SUT MNCRL 0 VIOLET CTX 36 (SUTURE) ×2 IMPLANT
SUT MNCRL+ AB 3-0 CT1 36 (SUTURE) ×2 IMPLANT
SUT MONOCRYL 0 CTX 36 (SUTURE) ×2
SUT MONOCRYL AB 3-0 CT1 36IN (SUTURE) ×2
SUT PDS AB 0 CTX 36 PDP370T (SUTURE) ×2 IMPLANT
SUT PLAIN 0 NONE (SUTURE) IMPLANT
SUT VIC AB 2-0 CT1 27 (SUTURE) ×1
SUT VIC AB 2-0 CT1 TAPERPNT 27 (SUTURE) IMPLANT
SUT VIC AB 4-0 KS 27 (SUTURE) ×1 IMPLANT
SYR 20ML LL LF (SYRINGE) IMPLANT
TOWEL OR 17X24 6PK STRL BLUE (TOWEL DISPOSABLE) ×2 IMPLANT
TRAXI PANNICULUS RETRACTOR (MISCELLANEOUS) ×1
TRAY FOLEY W/BAG SLVR 14FR LF (SET/KITS/TRAYS/PACK) ×1 IMPLANT
WATER STERILE IRR 1000ML POUR (IV SOLUTION) ×1 IMPLANT

## 2022-05-28 NOTE — Anesthesia Procedure Notes (Signed)
Spinal  Patient location during procedure: OR Start time: 05/28/2022 12:35 PM End time: 05/28/2022 12:40 PM Reason for block: surgical anesthesia Staffing Anesthesiologist: Bethena Midget, MD Performed by: Bethena Midget, MD Authorized by: Bethena Midget, MD   Preanesthetic Checklist Completed: patient identified, IV checked, site marked, risks and benefits discussed, surgical consent, monitors and equipment checked, pre-op evaluation and timeout performed Spinal Block Patient position: sitting Prep: DuraPrep Patient monitoring: heart rate, cardiac monitor, continuous pulse ox and blood pressure Approach: midline Location: L3-4 Injection technique: single-shot Needle Needle type: Sprotte  Needle gauge: 24 G Needle length: 9 cm Assessment Sensory level: T4 Events: CSF return and second provider

## 2022-05-28 NOTE — Lactation Note (Signed)
This note was copied from a baby's chart. Lactation Consultation Note  Patient Name: Catherine Christian JSEGB'T Date: 05/28/2022 Reason for consult: Initial assessment;Early term 37-38.6wks;Breastfeeding assistance;Maternal endocrine disorder Age:31 hours  Maternal Data Has patient been taught Hand Expression?: Yes Does the patient have breastfeeding experience prior to this delivery?: Yes How long did the patient breastfeed?: BReastfed her older child for 15 months  P2, Early Term, Infant Female  LC entered the room and the infant was being held by a support person.  Per the birth parent, the infant has not been able to latch well.  The birth parent states that her older child had a tongue tie and a lip tie that was not revised until he was 21 months old.  She states that she was able to breastfeed after the oral restrictions were released.  Her concern is that the infant may have issues as well.  LC answered all questions and addressed all concerns.  The birth parent would like to be seen tomorrow to have the latch and infant's oral cavity assessed.   Current Feeding Plan:  Breastfeed 8+ times in 24 hours according to feeding cues.  Hand express and spoon feed expressed milk to the infant.  Call RN/LC for assistance with latch.   Feeding Mother's Current Feeding Choice: Breast Milk  LATCH Score                    Lactation Tools Discussed/Used    Interventions Interventions: Breast feeding basics reviewed;LC Services brochure;Education  Discharge Pump: DEBP;Personal (Spectra)  Consult Status Consult Status: Follow-up Date: 05/29/22 Follow-up type: In-patient    Delene Loll 05/28/2022, 9:40 PM

## 2022-05-28 NOTE — Transfer of Care (Signed)
Immediate Anesthesia Transfer of Care Note  Patient: Catherine Christian  Procedure(s) Performed: CESAREAN SECTION  Patient Location: PACU  Anesthesia Type:Spinal  Level of Consciousness: awake, alert  and oriented  Airway & Oxygen Therapy: Patient Spontanous Breathing  Post-op Assessment: Report given to RN and Post -op Vital signs reviewed and stable  Post vital signs: Reviewed and stable  Last Vitals:  Vitals Value Taken Time  BP 101/62 05/28/22 1431  Temp 36.8 C 05/28/22 1430  Pulse 78 05/28/22 1433  Resp 19 05/28/22 1433  SpO2 95 % 05/28/22 1433  Vitals shown include unvalidated device data.  Last Pain:  Vitals:   05/28/22 1430  TempSrc: Oral  PainSc: 0-No pain         Complications: No notable events documented.

## 2022-05-28 NOTE — Op Note (Addendum)
Pre Op Dx:  1. Diamniotic/Dichorionic Twin Gestation with IUFD of Twin A at 18 weeks 2. Twin B at [redacted]w[redacted]d 3. Fetal malpresentation (breech) 4. History of cesarean delivery x 1 5. Resolved fetal growth restriction 6. Class A2 Diabetes Mellitus  Post Op Dx:  Same as pre-operative diagnoses  Procedure:  Low Transverse Cesarean Section  Surgeon:  Dr. Katrinka Blazing. Connye Burkitt Assistants:  Dr. Sheppard Evens Anesthesia:  Spinal  EBL:  612cc  IVF:  2400cc UOP:  200cc  Drains:  Foley catheter  Specimen removed:  Placenta x 2 - both sent to pathology  Demised twin -- patient has arranged cremation Device(s) implanted:  None Case Type:  Clean-contaminated Findings: Normal-appearing uterus, bilateral fallopian tubes, and ovaries. Fetus in frank breech position with nuchal cord x 1. Demised twin removed and appeared compressed and whitened/possibly slightly calcified. The placenta of the demised twin was removed and appeared small and slightly calcified. APGAR: 8/9. Fetal weight: 2820g (6lbs 3.5oz). Complications: None  Indications:  31 y.o. G2P1001 with a Diamniotic/Dichorionic Twin Gestation with IUFD of Twin A at 18 weeks with history of cesarean section, fetal malpresentation (breech), resolved fetal growth restriction, and not optimally controlled Class A2DM.  Procedure:  After informed consent was obtained, the patient was brought to the operating room.  Following administration of spinal anesthesia, the patient was positioned in dorsal supine position with a leftward tilt and was prepped and draped in sterile fashion.  A preoperative time-out was performed.  The abdomen was entered in layers through a pfannenstiel incision and an Teacher, early years/pre was placed.  A low transverse hysterotomy was created sharply to the level of the membranes, then extended bluntly.  The fetus was delivered from breech presentation atraumatically onto the field.  Bulb suctioning was performed.  The cord was doubly  clamped and cut after a 60 second pause.  The newborn was passed to the warmer.  The placenta was delivered.  The uterus was swept free of clots and debris and closed in a running locked fashion with 0-Monocryl. A second imbricating layer was used to close the uterus using 0-Monocryl.  Hemostasis was verified.  The abdomen was irrigated with warmed saline and cleared of clots. Subfascial spaces were inspected and hemostasis assured.  The fascia was closed in a running fashion with 0-PDS.  The subcutaneous tissues were irrigated and hemostasis assured. Below the fascia, 66ml of the Exparel mixture (as documented below) was administered. Above the fascia, 15ml of the Exparel mixture was administered. Along the subcutaneous tissues, 63ml of the Exparel mixture was administered.  The subcutaneous tissues were closed with 3-0 Monocryl.  The skin was closed with 4-0 Vicryl.  A Prevena wound system was applied. The patient was transferred to PACU.  All needle, sponge, and instrument counts were correct at the end of the case.    Disposition:  PACU  Comments: 65ml of Exparel local anesthetic, 13ml of 0.25% Bupivicaine, and 68ml of normal saline was mixed to provide pain control at the incision site. I performed the procedure and the assistant was needed due to the complexity of the anatomy. An experienced assistant was required given the standard of surgical care given the complexity of the case.  This assistant was needed for exposure, dissection, suctioning, retraction, instrument exchange, assisting with delivery with administration of fundal pressure, and for overall help during the procedure.     Steva Ready, DO

## 2022-05-28 NOTE — Inpatient Diabetes Management (Signed)
Inpatient Diabetes Program Recommendations  Diabetes Treatment Program Recommendations  ADA Standards of Care Diabetes in Pregnancy Target Glucose Ranges:  Fasting: 70 - 95 mg/dL 1 hr postprandial: Less than 140mg /dL (from first bite of meal) 2 hr postprandial: Less than 120 mg/dL (from first bite of meal)     Latest Reference Range & Units 05/28/22 11:14  Glucose-Capillary 70 - 99 mg/dL 83    Latest Reference Range & Units 11/02/20 08:30 04/30/21 08:09  Hemoglobin A1C 4.8 - 5.6 % 5.5 5.4   Review of Glycemic Control  Diabetes history: GDM; [redacted]W[redacted]D  Outpatient Diabetes medications: Lantus 10 units QHS, Metformin 1000 mg BID Current orders for Inpatient glycemic control: Novolog 0-14 units Q4H  NOTE: Patient being admitted for c-section. Per chart, patient has GDM and takes Lantus and Metformin outpatient as noted above. Prior to pregnancy patient was not on any DM medications. Anticipate glucose to normalize after delivery.  Would recommend to check CBGs AC&HS following delivery and could order Novolog 0-9 units TID with meals for coverage if needed.  Thanks, 05/02/21, RN, MSN, CDCES Diabetes Coordinator Inpatient Diabetes Program [phone] (Team Pager from 8am to 5pm)

## 2022-05-28 NOTE — Progress Notes (Signed)
With regards to the C-section performed on patient on 05/28/2022,  An experienced assistant was required given the standard of surgical care given the complexity of the case.  This assistant was needed for exposure, dissection, suctioning, retraction, instrument exchange, assisting with delivery with administration of fundal pressure, and for overall help during the procedure.  Sheppard Evens MD MPH OB Fellow, Faculty Practice [facility], Center for [facility] 05/28/2022

## 2022-05-28 NOTE — Interval H&P Note (Signed)
History and Physical Interval Note:  05/28/2022 11:33 AM  Catherine Christian  has presented today for surgery, with the diagnosis of History of Csection Gestational Diabetes Mellitus in Third Trimester.  The various methods of treatment have been discussed with the patient and family. After consideration of risks, benefits and other options for treatment, the patient has consented to  Procedure(s): CESAREAN SECTION (N/A) as a surgical intervention.  The patient's history has been reviewed, patient examined, no change in status, stable for surgery.  I have reviewed the patient's chart and labs.  Questions were answered to the patient's satisfaction.     Steva Ready

## 2022-05-28 NOTE — Anesthesia Preprocedure Evaluation (Signed)
Anesthesia Evaluation  Patient identified by MRN, date of birth, ID band Patient awake    Reviewed: Allergy & Precautions, NPO status , Patient's Chart, lab work & pertinent test results  Airway Mallampati: II  TM Distance: >3 FB Neck ROM: Full    Dental no notable dental hx. (+) Dental Advisory Given, Teeth Intact   Pulmonary asthma ,    Pulmonary exam normal breath sounds clear to auscultation       Cardiovascular hypertension, Normal cardiovascular exam Rhythm:Regular Rate:Normal     Neuro/Psych  Headaches, negative psych ROS   GI/Hepatic negative GI ROS, Neg liver ROS,   Endo/Other  diabetesMorbid obesity  Renal/GU negative Renal ROS     Musculoskeletal negative musculoskeletal ROS (+)   Abdominal (+) + obese,   Peds  Hematology negative hematology ROS (+)   Anesthesia Other Findings   Reproductive/Obstetrics (+) Pregnancy                            Anesthesia Physical  Anesthesia Plan  ASA: 3  Anesthesia Plan: Spinal   Post-op Pain Management: Minimal or no pain anticipated   Induction:   PONV Risk Score and Plan: 3 and Ondansetron, Dexamethasone, Scopolamine patch - Pre-op and Treatment may vary due to age or medical condition  Airway Management Planned: Natural Airway  Additional Equipment: None  Intra-op Plan:   Post-operative Plan:   Informed Consent: I have reviewed the patients History and Physical, chart, labs and discussed the procedure including the risks, benefits and alternatives for the proposed anesthesia with the patient or authorized representative who has indicated his/her understanding and acceptance.     Dental advisory given  Plan Discussed with: CRNA and Anesthesiologist  Anesthesia Plan Comments:         Anesthesia Quick Evaluation

## 2022-05-29 ENCOUNTER — Encounter (HOSPITAL_COMMUNITY): Payer: Self-pay | Admitting: Obstetrics and Gynecology

## 2022-05-29 LAB — CBC
HCT: 30.9 % — ABNORMAL LOW (ref 36.0–46.0)
Hemoglobin: 10.9 g/dL — ABNORMAL LOW (ref 12.0–15.0)
MCH: 31.2 pg (ref 26.0–34.0)
MCHC: 35.3 g/dL (ref 30.0–36.0)
MCV: 88.5 fL (ref 80.0–100.0)
Platelets: 151 10*3/uL (ref 150–400)
RBC: 3.49 MIL/uL — ABNORMAL LOW (ref 3.87–5.11)
RDW: 12.7 % (ref 11.5–15.5)
WBC: 8.6 10*3/uL (ref 4.0–10.5)
nRBC: 0 % (ref 0.0–0.2)

## 2022-05-29 LAB — GLUCOSE, CAPILLARY: Glucose-Capillary: 90 mg/dL (ref 70–99)

## 2022-05-29 MED ORDER — OXYCODONE HCL 5 MG PO TABS: 5.0000 mg | ORAL_TABLET | Freq: Four times a day (QID) | ORAL | 0 refills | Status: DC | PRN

## 2022-05-29 MED ORDER — IBUPROFEN 600 MG PO TABS: 600.0000 mg | ORAL_TABLET | Freq: Four times a day (QID) | ORAL | 1 refills | Status: DC | PRN

## 2022-05-29 MED ORDER — RHO D IMMUNE GLOBULIN 1500 UNIT/2ML IJ SOSY
300.0000 ug | PREFILLED_SYRINGE | Freq: Once | INTRAMUSCULAR | Status: AC
  Administered 2022-05-29: 300 ug via INTRAVENOUS
  Filled 2022-05-29: qty 2

## 2022-05-29 NOTE — Anesthesia Postprocedure Evaluation (Signed)
Anesthesia Post Note  Patient: Minnetta Sandora  Procedure(s) Performed: CESAREAN SECTION     Patient location during evaluation: PACU Anesthesia Type: Spinal Level of consciousness: oriented and awake and alert Pain management: pain level controlled Vital Signs Assessment: post-procedure vital signs reviewed and stable Respiratory status: spontaneous breathing, respiratory function stable and patient connected to nasal cannula oxygen Cardiovascular status: blood pressure returned to baseline and stable Postop Assessment: no headache, no backache and no apparent nausea or vomiting Anesthetic complications: no   No notable events documented.  Last Vitals:  Vitals:   05/28/22 2350 05/29/22 0415  BP: 110/63 108/65  Pulse: 71 72  Resp: 17 18  Temp: 37 C 37 C  SpO2: 97% 97%    Last Pain:  Vitals:   05/29/22 0415  TempSrc: Oral  PainSc: 2                  Zhoey Blackstock

## 2022-05-29 NOTE — Lactation Note (Signed)
This note was copied from a baby's chart. Lactation Consultation Note  Patient Name: Catherine Christian [id number] Date: 05/29/2022 Reason for consult: Follow-up assessment;Early term 37-38.6wks Age:31 hours   P2: Early term infant at 38+3 weeks Feeding preference: Breast Weight loss: 4%  Birth parent requested assistance.  RN in room when I arrived preparing to assist with latching.  "[name]" was showing cues.  Offered to assist; birth parent receptive.  Asked birth parent to demonstrate hand expression; revised technique and she was able to express drops which I finger fed to "[name]."  Assisted to latch in the cross cradle hold.  "[name]" eager, however, once at the breast became fussy and pushed back.  Repeated attempts in this position without success.  Suggested trying the football hold and she latched with ease.  With gentle stimulation she took a few good sucks.  Reviewed breast support, positioning, body alignment.  Demonstrated gentle stimulation and breast compressions.  "[name]" required repeated attempts to latch.  She continued to latch easily but would only take 3-4 sucks and stop.  Suggested birth parent use the manual pump and feed back any expressed colostrum to baby.  She was able to obtain 1 ml.  #24 flange is appropriate at this time and also provided a #27 as I anticipate she may need to move up in flange size in the near future.  Breast shells given with instructions for use.  Observed a thin lingual frenulum, however, "[name]" was able to obtain a deep latch and birth parent denied pain with the few sucks observed.  Will continue to monitor.  Her first child has a tongue/lip restriction with revision.  Support person and one other visitor present.  RN updated.   Maternal Data Has patient been taught Hand Expression?: Yes Does the patient have breastfeeding experience prior to this delivery?: Yes How long did the patient breastfeed?: 15 months with her 31 year  old  Feeding Mother's Current Feeding Choice: Breast Milk  LATCH Score Latch: Repeated attempts needed to sustain latch, nipple held in mouth throughout feeding, stimulation needed to elicit sucking reflex.  Audible Swallowing: None  Type of Nipple: Everted at rest and after stimulation  Comfort (Breast/Nipple): Soft / non-tender  Hold (Positioning): Assistance needed to correctly position infant at breast and maintain latch.  LATCH Score: 6   Lactation Tools Discussed/Used Tools: Shells;Pump;Flanges Flange Size: 24;27 Breast pump type: Manual Pump Education: Setup, frequency, and cleaning;Milk Storage Reason for Pumping: Nipple eversion; colostrum drops for baby Pumping frequency: prior to latching and prn Pumped volume: 1 mL  Interventions Interventions: Breast feeding basics reviewed;Assisted with latch;Skin to skin;Breast massage;Hand express;Pre-pump if needed;Breast compression;Adjust position;Support pillows;Position options;Expressed milk;Shells;Hand pump;Education  Discharge Pump: Personal Chiropractor)  Consult Status Consult Status: Follow-up Date: 05/30/22 Follow-up type: In-patient    Johana Hopkinson R Jacobus Colvin 05/29/2022, 9:19 AM

## 2022-05-29 NOTE — Progress Notes (Signed)
Postpartum Note Day #1  S:  Patient doing well.  Pain controlled.  Tolerating regular diet.   Ambulating without difficulty. Foley catheter removed but has not voided yet. Denies fevers, chills, chest pain, SOB, N/V, or worsening bilateral LE edema.  Lochia: Minimal Infant feeding:  Breast Circumcision:  N/A, female infant Contraception:  None  O: Temp:  [97.6 F (36.4 C)-99.2 F (37.3 C)] 98.6 F (37 C) (09/13 0415) Pulse Rate:  [71-89] 72 (09/13 0415) Resp:  [16-25] 18 (09/13 0415) BP: (93-139)/(55-83) 108/65 (09/13 0415) SpO2:  [95 %-100 %] 97 % (09/13 0415) Weight:  [107.5 kg] 107.5 kg (09/12 1107) Gen: NAD, pleasant and cooperative Resp: No increased work of breathing Abdomen: soft, non-distended, non-tender throughout Uterus: firm, non-tender, below umbilicus Incision: Prevena in place with good suction Ext: Trace bilateral LE edema, no bilateral calf tenderness, SCDs on and working  Labs:  Recent Labs    05/28/22 1743 05/29/22 0529  HGB 12.8 10.9*  HCT 35.7* 30.9*    A/P: Patient is a 31 y.o. [id number] POD#1 s/p repeat LTCS.  - Pain well controlled  - GU: UOP is adequate - GI: Tolerating regular diet - Activity: encouraged sitting up to chair and ambulation as tolerated - DVT Prophylaxis: SCDs, Lovenox, ambulation - Labs: as above, fasting blood sugar 90 this morning  Disposition:  D/C home POD#2-3.  Steva Ready, DO [phone] (office)

## 2022-05-30 LAB — RH IG WORKUP (INCLUDES ABO/RH)
Fetal Screen: NEGATIVE
Gestational Age(Wks): 38.3
Unit division: 0

## 2022-05-30 LAB — SURGICAL PATHOLOGY

## 2022-05-30 NOTE — Progress Notes (Signed)
Postpartum Note Day #2  S:  Patient doing well.  Pain controlled.  Tolerating regular diet.   Ambulating without difficulty and voiding without difficulty. Desires discharge home today if infant is discharged. Denies fevers, chills, chest pain, SOB, N/V, or worsening bilateral LE edema.  Lochia: Minimal Infant feeding:  Breast and donor milk Circumcision:  N/A, female infant Contraception:  None  O: Temp:  [98.3 F (36.8 C)-98.5 F (36.9 C)] 98.3 F (36.8 C) (09/14 0621) Pulse Rate:  [69-77] 69 (09/14 0621) Resp:  [16-18] 18 (09/14 0621) BP: (102-133)/(60-71) 118/68 (09/14 0621) SpO2:  [97 %-99 %] 99 % (09/14 0621) Gen: NAD, pleasant and cooperative Resp: No increased work of breathing Abdomen: soft, non-distended, non-tender throughout Uterus: firm, non-tender, below umbilicus Incision: Prevena in place with good suction Ext: Trace bilateral LE edema, no bilateral calf tenderness  Labs:  Recent Labs    05/28/22 1743 05/29/22 0529  HGB 12.8 10.9*  HCT 35.7* 30.9*    A/P: Patient is a 31 y.o. [id number] POD#2 s/p repeat LTCS.  - Pain well controlled  - GU: UOP is adequate - GI: Tolerating regular diet - Activity: encouraged sitting up to chair and ambulation as tolerated - DVT Prophylaxis: SCDs, Lovenox, ambulation - Labs: as above, fasting blood sugar 90 yesterday  Disposition:  D/C home today or tomorrow, pending infant discharge.  Steva Ready, DO [phone] (office)

## 2022-05-30 NOTE — Discharge Summary (Signed)
Postpartum Discharge Summary  Date of Service: 05/30/22     Patient Name: Catherine Christian DOB: 1991-03-02 MRN: [id number]  Date of admission: 05/28/2022 Delivery date:05/28/2022  Delivering provider: Drema Dallas  Date of discharge: 05/30/2022  Admitting diagnosis: Gestational diabetes mellitus (GDM) requiring insulin [O24.414] Diamniotic/Dichorionic twin gestation with IUFD of Twin A at 25 weeks Previous fetal growth restriction (resolved) Fetal malpresentation (breech) Class A2 DM (not optimally controlled) History of CS x 1  Rh negative  Obesity (BMI 34) Situational depression  History of preeclampsia without SF  History of gestational diabetes Intrauterine pregnancy: [redacted]w[redacted]d    Secondary diagnosis:  Principal Problem:   Gestational diabetes mellitus (GDM) requiring insulin Diamniotic/Dichorionic twin gestation with IUFD of Twin A at 148weeks Previous fetal growth restriction (resolved) Fetal malpresentation (breech) Class A2 DM (not optimally controlled) History of CS x 1  Rh negative  Obesity (BMI 34) Situational depression  History of preeclampsia without SF  History of gestational diabetes Additional problems: None    Discharge diagnosis: Term Pregnancy Delivered                                              Post partum procedures: None Augmentation: N/A Complications: None  Hospital course: Sceduled C/S   31y.o. yo G2P2002 at 365w3das admitted to the hospital 05/28/2022 for scheduled cesarean section with the following indication:Elective Repeat and Class A2 Diabetes (not optimally controlled), Malpresentation (breech) .Delivery details are as follows:  Membrane Rupture Time/Date: 1:08 PM ,05/28/2022   Delivery Method:C-Section, Low Transverse  Details of operation can be found in separate operative note.  Patient had an uncomplicated postpartum course.  She is ambulating, tolerating a regular diet, passing flatus, and urinating well. Patient is discharged  home in stable condition on  05/30/22        Newborn Data: Birth date:05/28/2022  Birth time:1:09 PM  Gender:Female  Living status:Living  Apgars:8 ,9  Weight:2820 g     Magnesium Sulfate received: No BMZ received: No Rhophylac:Yes MMR:N/A T-DaP:Given prenatally Flu: No Transfusion:No  Physical exam  Vitals:   05/29/22 0820 05/29/22 1417 05/29/22 2354 05/30/22 0621  BP: 104/60 102/67 133/71 118/68  Pulse: 72 77 76 69  Resp: _0 Temp: 98.4 F (36.9 C) 98.3 F (36.8 C) 98.5 F (36.9 C) 98.3 F (36.8 C)  TempSrc: Oral Oral Oral Oral  SpO2: 97% 97% 98% 99%  Weight:      Height:       General: alert, cooperative, and no distress Lochia: appropriate Uterine Fundus: firm Incision: Prevena in place with adequate suction DVT Evaluation: No evidence of DVT seen on physical exam. No cords or calf tenderness. Labs: Lab Results  Component Value Date   WBC 8.6 05/29/2022   HGB 10.9 (L) 05/29/2022   HCT 30.9 (L) 05/29/2022   MCV 88.5 05/29/2022   PLT 151 05/29/2022      Latest Ref Rng & Units 05/28/2022    5:43 PM  CMP  Creatinine 0.44 - 1.00 mg/dL 0.54    Edinburgh Score:    05/29/2022    2:19 PM  Edinburgh Postnatal Depression Scale Screening Tool  I have been able to laugh and see the funny side of things. 0  I have looked forward with enjoyment to things. 0  I have blamed myself unnecessarily when things went wrong. 2  I have been anxious or worried for no good reason. 1  I have felt scared or panicky for no good reason. 1  Things have been getting on top of me. 2  I have been so unhappy that I have had difficulty sleeping. 0  I have felt sad or miserable. 2  I have been so unhappy that I have been crying. 1  The thought of harming myself has occurred to me. 0  Edinburgh Postnatal Depression Scale Total 9      After visit meds:  Allergies as of 05/30/2022       Reactions   Cabbage Nausea Only        Medication List     STOP taking these  medications    acetaminophen 500 MG tablet Commonly known as: TYLENOL   aspirin EC 81 MG tablet   insulin glargine 100 UNIT/ML injection Commonly known as: LANTUS   metFORMIN 500 MG tablet Commonly known as: GLUCOPHAGE       TAKE these medications    calcium carbonate 500 MG chewable tablet Commonly known as: TUMS - dosed in mg elemental calcium Chew 1-2 tablets by mouth 3 (three) times daily as needed for heartburn or indigestion.   cetirizine 10 MG tablet Commonly known as: ZYRTEC Take 10 mg by mouth every evening.   fluticasone 50 MCG/ACT nasal spray Commonly known as: FLONASE Place 1 spray into both nostrils in the morning.   ibuprofen 600 MG tablet Commonly known as: ADVIL Take 1 tablet (600 mg total) by mouth every 6 (six) hours as needed for mild pain, moderate pain or cramping.   LIDOCAINE-MENTHOL ROLL-ON EX Apply 1 application topically as needed (stiff neck).   oxyCODONE 5 MG immediate release tablet Commonly known as: Oxy IR/ROXICODONE Take 1-2 tablets (5-10 mg total) by mouth every 6 (six) hours as needed for breakthrough pain or severe pain.   PreNatal DHA 200 MG Caps Generic drug: Docosahexaenoic Acid Take 1 capsule (200 mg total) by mouth daily. What changed:  how much to take when to take this   RA ANTIHISTAMINE EYE DROPS OP Place 1-2 drops into both eyes 3 (three) times daily as needed (allergy eyes.).   Vitamin D-3 125 MCG (5000 UT) Tabs Take 1 tablet by mouth daily. What changed: when to take this         Discharge home in stable condition Infant Feeding: Breast Infant Disposition:home with mother Discharge instruction: per After Visit Summary and Postpartum booklet. Activity: Advance as tolerated. Pelvic rest for 6 weeks.  Diet: routine diet Anticipated Birth Control:  None Postpartum Appointment:6 weeks Additional Postpartum F/U: Postpartum Depression checkup and Incision check 1 week Future Appointments: Future Appointments   Date Time Provider [place]  08/21/2022  7:40 AM Laqueta Linden, MD [facility] None   Follow up Visit:  Follow-up Information     Drema Dallas, DO Follow up in 1 week(s).   Specialty: Obstetrics and Gynecology Why: Please keep your 1 week incision check and your 6 week postpartum visit. Contact information: [address] [place] [address] [place] [address] [phone]                     05/30/2022 Drema Dallas, DO

## 2022-05-30 NOTE — Lactation Note (Signed)
This note was copied from a baby's chart. Lactation Consultation Note  Patient Name: Catherine Christian [id number] Date: 05/30/2022 Reason for consult: Mother's request;Early term 37-38.6wks;Maternal endocrine disorder Age:31 hours Mom wanted LC to come see feeding and pumping for flange sizing. Gave mom #21 flange for Lt. Nipple. Rt. Breast has edema. Baby unable to latch to Rt. Breast. Nipple to latge for baby's mouth and to much edema, not compressible. Baby able to get Lt. Nipple in mouth but it's not a deep latch. Mom stated the nipple is back enough in baby's mouth. Doesn't hurt but doesn't feel right. 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 Mom stated her sone did and didn't BF well until lip and tongue tie fixed at 3 months old. Mom pumped 5 ml colostrum. Is giving DBM after latching. Told mom to increase amount since BF isn't going that well yet. 5 minute BF isn't a good feeding. Baby wouldn't feed any more. Taken away then wants to cry. Discussed cluster feeding as well. Praised mom for doing a good job working at [employer].  Maternal Data Has patient been taught Hand Expression?: Yes Does the patient have breastfeeding experience prior to this delivery?: Yes How long did the patient breastfeed?: 18 months  Feeding Mother's Current Feeding Choice: Breast Milk and Donor Milk Nipple Type: Extra Slow Flow  LATCH Score Latch: Grasps breast easily, tongue down, lips flanged, rhythmical sucking.  Audible Swallowing: None  Type of Nipple: Everted at rest and after stimulation  Comfort (Breast/Nipple): Filling, red/small blisters or bruises, mild/mod discomfort (edema)  Hold (Positioning): Assistance needed to correctly position infant at breast and maintain latch.  LATCH Score: 6   Lactation Tools Discussed/Used Tools: Pump;Flanges;Coconut oil Flange Size: 21;24 Breast pump type: Double-Electric Breast Pump  Interventions Interventions: Adjust position;DEBP;Breast feeding basics  reviewed;Assisted with latch;Support pillows;Skin to skin;Position options;Breast massage;Expressed milk;Hand express;Coconut oil;Breast compression;Reverse pressure  Discharge    Consult Status Consult Status: Follow-up Date: 05/30/22 Follow-up type: In-patient    Charyl Dancer 05/30/2022, 12:11 AM

## 2022-05-30 NOTE — Lactation Note (Signed)
This note was copied from a baby's chart. Lactation Consultation Note  Patient Name: Catherine Christian TXMIW'O Date: 05/30/2022 Reason for consult: Follow-up assessment;Difficult latch;Early term 37-38.6wks Age:31 hours  Parent states right nipple is very sore and enlarged from pumping. Expressing more with hand pump. Parent has previous child with tongue tie/ revised 2 times.   Parent also saw CST, Kathryne Eriksson for body work.    LC observed electric pump.  24 was more comfortable.  Per parent, 27 pulled much of the areola into the flange.  BAby attempted to latch but continued tongue thrusting and mouth was too small/tight to successfully sustain latch at \\this  time.  Plan: Encouraged STS, bottle feed expressed milk first, then use DBM or formula to complete supplement amount recommended. Sheet provided. PUmp to stimulate supply; every 3 hours. Use personal pump, if needed use manual for the right breast.   Follow up with OP [facility].   Maternal Data Has patient been taught Hand Expression?: Yes Does the patient have breastfeeding experience prior to this delivery?: Yes  Feeding Mother's Current Feeding Choice: Breast Milk and Donor Milk Nipple Type: Extra Slow Flow  LATCH Score Latch: Too sleepy or reluctant, no latch achieved, no sucking elicited.  Audible Swallowing: None  Type of Nipple: Everted at rest and after stimulation  Comfort (Breast/Nipple): Filling, red/small blisters or bruises, mild/mod discomfort (right nipple at base/ from pumping)  Hold (Positioning): Assistance needed to correctly position infant at breast and maintain latch.  LATCH Score: 4   Lactation Tools Discussed/Used Tools: Pump Flange Size: 24 Breast pump type: Double-Electric Breast Pump;Manual Pump Education: Setup, frequency, and cleaning;Milk Storage Reason for Pumping: stimulate supply and collect milk to feed back to infant Pumped volume: 11  mL  Interventions Interventions: Breast feeding basics reviewed;Assisted with latch;DEBP;Education;LC Services brochure  Discharge Discharge Education: Outpatient recommendation;Warning signs for feeding baby;Engorgement and breast care;Outpatient Epic message sent Pump: Personal;Manual  Consult Status Consult Status: Complete Date: 05/30/22 Follow-up type: In-patient    Maryruth Hancock [facility] 05/30/2022, 10:46 AM

## 2022-06-05 ENCOUNTER — Telehealth (HOSPITAL_COMMUNITY): Payer: Self-pay | Admitting: *Deleted

## 2022-06-05 NOTE — Telephone Encounter (Signed)
Left phone voicemail message.  Odis Hollingshead, RN 06-05-2022 at 9:41am

## 2022-08-21 ENCOUNTER — Encounter (INDEPENDENT_AMBULATORY_CARE_PROVIDER_SITE_OTHER): Payer: Self-pay | Admitting: Family Medicine

## 2022-08-21 ENCOUNTER — Ambulatory Visit (INDEPENDENT_AMBULATORY_CARE_PROVIDER_SITE_OTHER): Payer: Medicaid Other | Admitting: Family Medicine

## 2022-08-21 VITALS — BP 105/70 | HR 84 | Temp 98.7°F | Ht 64.0 in | Wt 239.0 lb

## 2022-08-21 DIAGNOSIS — Z8632 Personal history of gestational diabetes: Secondary | ICD-10-CM

## 2022-08-21 DIAGNOSIS — E669 Obesity, unspecified: Secondary | ICD-10-CM

## 2022-08-21 DIAGNOSIS — Z6841 Body Mass Index (BMI) 40.0 and over, adult: Secondary | ICD-10-CM

## 2022-09-03 NOTE — Progress Notes (Signed)
Chief Complaint:   OBESITY Catherine Christian is here to discuss her progress with her obesity treatment plan along with follow-up of her obesity related diagnoses. Catherine Christian is on the Category 4 Plan and states she is following her eating plan approximately 30% of the time. Catherine Christian states she is walking through out the day.   Today's visit was #: 25 Starting weight: 236 lbs Starting date: 06/26/2020 Today's weight: 239 lbs Today's date: 08/21/2022 Total lbs lost to date: 0 lbs Total lbs lost since last in-office visit: 0  Interim History: Catherine Christian's 1st appointment since delivery of baby girl. Family is adjusting to addition and new routine. Breakfast is on plan but rest of day is not as structured or regimented. She has done some division of [place] that she got after Thanksgiving. Leaving for vacation  tomorrow and then doing some family gatherings for Christmas Eve and Christmas.  Subjective:   1. History of gestational diabetes Recent pregnancy with gestational diabetes.  Assessment/Plan:   1. History of gestational diabetes Labs to be done at next appointment.  2. Obesity with current BMI of 41.1 Catherine Christian is currently in the action stage of change. As such, her goal is to continue with weight loss efforts. She has agreed to the Category 4 Plan.   Exercise goals: All adults should avoid inactivity. Some physical activity is better than none, and adults who participate in any amount of physical activity gain some health benefits.  Behavioral modification strategies: increasing lean protein intake and meal planning and cooking strategies.  Catherine Christian has agreed to follow-up with our clinic in 4 weeks. She was informed of the importance of frequent follow-up visits to maximize her success with intensive lifestyle modifications for her multiple health conditions.   Objective:   Blood pressure 105/70, pulse 84, temperature 98.7 F (37.1 C), height 5\' 4"  (1.626 m), weight 239 lb (108.4 kg),  SpO2 97 %, currently breastfeeding. Body mass index is 41.02 kg/m.  General: Cooperative, alert, well developed, in no acute distress. HEENT: Conjunctivae and lids unremarkable. Cardiovascular: Regular rhythm.  Lungs: Normal work of breathing. Neurologic: No focal deficits.   Lab Results  Component Value Date   CREATININE 0.54 05/28/2022   BUN 19 04/30/2021   NA 141 04/30/2021   K 4.4 04/30/2021   CL 105 04/30/2021   CO2 21 04/30/2021   Lab Results  Component Value Date   ALT 25 04/30/2021   AST 20 04/30/2021   ALKPHOS 49 04/30/2021   BILITOT 0.5 04/30/2021   Lab Results  Component Value Date   HGBA1C 5.4 04/30/2021   HGBA1C 5.5 11/02/2020   Lab Results  Component Value Date   INSULIN 11.0 04/30/2021   INSULIN 7.0 11/02/2020   INSULIN 14.3 06/26/2020   Lab Results  Component Value Date   TSH 1.650 06/26/2020   Lab Results  Component Value Date   CHOL 159 06/26/2020   HDL 63 06/26/2020   LDLCALC 81 06/26/2020   TRIG 77 06/26/2020   Lab Results  Component Value Date   VD25OH 49.2 04/30/2021   VD25OH 50.6 11/02/2020   VD25OH 30.3 06/26/2020   Lab Results  Component Value Date   WBC 8.6 05/29/2022   HGB 10.9 (L) 05/29/2022   HCT 30.9 (L) 05/29/2022   MCV 88.5 05/29/2022   PLT 151 05/29/2022   No results found for: "IRON", "TIBC", "FERRITIN"  Attestation Statements:   Reviewed by clinician on day of visit: allergies, medications, problem list, medical history, surgical history, family  history, social history, and previous encounter notes.  I, Fortino Sic, RMA am acting as transcriptionist for Reuben Likes, MD.  I have reviewed the above documentation for accuracy and completeness, and I agree with the above. - Reuben Likes, MD

## 2022-09-23 ENCOUNTER — Encounter (INDEPENDENT_AMBULATORY_CARE_PROVIDER_SITE_OTHER): Payer: Self-pay | Admitting: Family Medicine

## 2022-09-23 ENCOUNTER — Ambulatory Visit (INDEPENDENT_AMBULATORY_CARE_PROVIDER_SITE_OTHER): Payer: Medicaid Other | Admitting: Family Medicine

## 2022-09-23 VITALS — BP 121/75 | HR 80 | Temp 98.5°F | Ht 64.0 in | Wt 238.0 lb

## 2022-09-23 DIAGNOSIS — E669 Obesity, unspecified: Secondary | ICD-10-CM | POA: Diagnosis not present

## 2022-09-23 DIAGNOSIS — Z8632 Personal history of gestational diabetes: Secondary | ICD-10-CM

## 2022-09-23 DIAGNOSIS — Z6841 Body Mass Index (BMI) 40.0 and over, adult: Secondary | ICD-10-CM

## 2022-09-23 DIAGNOSIS — R0602 Shortness of breath: Secondary | ICD-10-CM

## 2022-09-23 DIAGNOSIS — E559 Vitamin D deficiency, unspecified: Secondary | ICD-10-CM

## 2022-09-24 LAB — HEMOGLOBIN A1C
Est. average glucose Bld gHb Est-mCnc: 117 mg/dL
Hgb A1c MFr Bld: 5.7 % — ABNORMAL HIGH (ref 4.8–5.6)

## 2022-09-24 LAB — COMPREHENSIVE METABOLIC PANEL
ALT: 26 IU/L (ref 0–32)
AST: 21 IU/L (ref 0–40)
Albumin/Globulin Ratio: 2.5 — ABNORMAL HIGH (ref 1.2–2.2)
Albumin: 4.9 g/dL (ref 3.9–4.9)
Alkaline Phosphatase: 66 IU/L (ref 44–121)
BUN/Creatinine Ratio: 24 — ABNORMAL HIGH (ref 9–23)
BUN: 15 mg/dL (ref 6–20)
Bilirubin Total: 0.4 mg/dL (ref 0.0–1.2)
CO2: 21 mmol/L (ref 20–29)
Calcium: 9.7 mg/dL (ref 8.7–10.2)
Chloride: 105 mmol/L (ref 96–106)
Creatinine, Ser: 0.63 mg/dL (ref 0.57–1.00)
Globulin, Total: 2 g/dL (ref 1.5–4.5)
Glucose: 102 mg/dL — ABNORMAL HIGH (ref 70–99)
Potassium: 4.3 mmol/L (ref 3.5–5.2)
Sodium: 144 mmol/L (ref 134–144)
Total Protein: 6.9 g/dL (ref 6.0–8.5)
eGFR: 122 mL/min/{1.73_m2} (ref >59)

## 2022-09-24 LAB — INSULIN, RANDOM: INSULIN: 8 u[IU]/mL (ref 2.6–24.9)

## 2022-09-24 LAB — VITAMIN D 25 HYDROXY (VIT D DEFICIENCY, FRACTURES): Vit D, 25-Hydroxy: 44.7 ng/mL (ref 30.0–100.0)

## 2022-10-01 NOTE — Progress Notes (Signed)
Chief Complaint:   OBESITY Catherine Christian is here to discuss her progress with her obesity treatment plan along with follow-up of her obesity related diagnoses. Catherine Christian is on the Category 4 Plan and states she is following her eating plan approximately 60-70% of the time. Catherine Christian states she is using treadmill 30-40 minutes 3 times per week.  Today's visit was #: 26 Starting weight: 236 lbs Starting date: 06/26/2020 Today's weight: 238 Today's date: 09/23/2022 Total lbs lost to date: 0 lbs Total lbs lost since last in-office visit: 0  Interim History: Catherine Christian had a great holiday season.  She is noticing an increase in hunger with breast-feeding.  She has been slightly more indulgent over the holidays.  Wants to do a combo of structured plan and journaling.  Subjective:   1. SOBOE (shortness of breath on exertion) Last IC of 1930 at last check.  Patient recognizes she is slowly losing.  Notes hunger.  2. History of gestational diabetes On Metformin previously.  Last A1c prior to pregnancy was 5.4.  3. Vitamin D deficiency Taking over-the-counter vitamin D.  Notes fatigue.  Assessment/Plan:   1. SOBOE (shortness of breath on exertion) IC today of 2462 (significant increase).  2. History of gestational diabetes We will obtain labs today.  - Comprehensive metabolic panel - Hemoglobin A1c - Insulin, random  3. Vitamin D deficiency We will obtain labs today.  - VITAMIN D 25 Hydroxy (Vit-D Deficiency, Fractures)  4. Obesity with current BMI of 40.9 Jorja is currently in the action stage of change. As such, her goal is to continue with weight loss efforts. She has agreed to the Category 4 Plan and keeping a food journal and adhering to recommended goals of 2100-2200 calories and 145+ grams of protein daily.   Exercise goals: All adults should avoid inactivity. Some physical activity is better than none, and adults who participate in any amount of physical activity gain some  health benefits.  Behavioral modification strategies: increasing lean protein intake, meal planning and cooking strategies, keeping healthy foods in the home, planning for success, and keeping a strict food journal.  Catherine Christian has agreed to follow-up with our clinic in 3 weeks. She was informed of the importance of frequent follow-up visits to maximize her success with intensive lifestyle modifications for her multiple health conditions.   Catherine Christian was informed we would discuss her lab results at her next visit unless there is a critical issue that needs to be addressed sooner. Catherine Christian agreed to keep her next visit at the agreed upon time to discuss these results.  Objective:   Blood pressure 121/75, pulse 80, temperature 98.5 F (36.9 C), height 5\' 4"  (1.626 m), weight 238 lb (108 kg), last menstrual period 09/15/2022, SpO2 99 %, currently breastfeeding. Body mass index is 40.85 kg/m.  General: Cooperative, alert, well developed, in no acute distress. HEENT: Conjunctivae and lids unremarkable. Cardiovascular: Regular rhythm.  Lungs: Normal work of breathing. Neurologic: No focal deficits.   Lab Results  Component Value Date   CREATININE 0.63 09/23/2022   BUN 15 09/23/2022   NA 144 09/23/2022   K 4.3 09/23/2022   CL 105 09/23/2022   CO2 21 09/23/2022   Lab Results  Component Value Date   ALT 26 09/23/2022   AST 21 09/23/2022   ALKPHOS 66 09/23/2022   BILITOT 0.4 09/23/2022   Lab Results  Component Value Date   HGBA1C 5.7 (H) 09/23/2022   HGBA1C 5.4 04/30/2021   HGBA1C 5.5 11/02/2020  Lab Results  Component Value Date   INSULIN 8.0 09/23/2022   INSULIN 11.0 04/30/2021   INSULIN 7.0 11/02/2020   INSULIN 14.3 06/26/2020   Lab Results  Component Value Date   TSH 1.650 06/26/2020   Lab Results  Component Value Date   CHOL 159 06/26/2020   HDL 63 06/26/2020   LDLCALC 81 06/26/2020   TRIG 77 06/26/2020   Lab Results  Component Value Date   VD25OH 44.7 09/23/2022    VD25OH 49.2 04/30/2021   VD25OH 50.6 11/02/2020   Lab Results  Component Value Date   WBC 8.6 05/29/2022   HGB 10.9 (L) 05/29/2022   HCT 30.9 (L) 05/29/2022   MCV 88.5 05/29/2022   PLT 151 05/29/2022   No results found for: "IRON", "TIBC", "FERRITIN"  Attestation Statements:   Reviewed by clinician on day of visit: allergies, medications, problem list, medical history, surgical history, family history, social history, and previous encounter notes.  I, Elnora Morrison, RMA am acting as transcriptionist for Coralie Common, MD.  I have reviewed the above documentation for accuracy and completeness, and I agree with the above. - Coralie Common, MD

## 2022-10-21 ENCOUNTER — Encounter (INDEPENDENT_AMBULATORY_CARE_PROVIDER_SITE_OTHER): Payer: Self-pay | Admitting: Family Medicine

## 2022-10-21 ENCOUNTER — Ambulatory Visit (INDEPENDENT_AMBULATORY_CARE_PROVIDER_SITE_OTHER): Payer: Medicaid Other | Admitting: Family Medicine

## 2022-10-21 VITALS — BP 100/66 | HR 81 | Temp 98.8°F | Ht 64.0 in | Wt 234.0 lb

## 2022-10-21 DIAGNOSIS — R7303 Prediabetes: Secondary | ICD-10-CM

## 2022-10-21 DIAGNOSIS — E669 Obesity, unspecified: Secondary | ICD-10-CM | POA: Diagnosis not present

## 2022-10-21 DIAGNOSIS — Z6841 Body Mass Index (BMI) 40.0 and over, adult: Secondary | ICD-10-CM

## 2022-10-21 NOTE — Progress Notes (Deleted)
Patient has been following meal 85-90% of the time.  She states this is a modest assessment.  She is incorporating snack calories throughout the day and is occassionally journaling.  She hasn't been consistently hungry.  She recognizes that certain foods cannot be in the house without her being tempted consistently.  She looked at her labs and noticed the prediabetes diagnosis again.  Her husband is going out of town at the end of the month.  Otherwise she doesn't have many plans coming up in terms of events or travel.

## 2022-11-06 NOTE — Progress Notes (Signed)
Chief Complaint:   OBESITY Catherine Christian is here to discuss her progress with her obesity treatment plan along with follow-up of her obesity related diagnoses. Catherine Christian is on the Category 4 Plan and keeping a food journal and adhering to recommended goals of 2100-2200 calories and 145+ grams of protein and states she is following her eating plan approximately 85-90% of the time. Catherine Christian states she is exercising 0 minutes 0 times per week.  Today's visit was #: 50 Starting weight: 236 lbs Starting date: 06/26/2020 Today's weight: 234 lbs Today's date: 10/21/2022 Total lbs lost to date: 2 lbs Total lbs lost since last in-office visit: 4  Interim History: Catherine Christian has been following meal 85-90% of the time. She states this is a modest assessment. She is incorporating snack calories throughout the day and is occassionally journaling. She hasn't been consistently hungry. She recognizes that certain foods cannot be in the house without her being tempted consistently. She looked at her labs and noticed the prediabetes diagnosis again. Her husband is going out of town at the end of the month. Otherwise she doesn't have many plans coming up in terms of events or travel.   Subjective:   1. Prediabetes Labs discussed during visit today.  Recent A1c at 5.7.  Not on medications.  More control of carb intake.  Assessment/Plan:   1. Prediabetes Will repeat labs in 3 months.  2. BMI 40.0-44.9, adult ([place])  3. Obesity with starting BMI of 40.5 Eric is currently in the action stage of change. As such, her goal is to continue with weight loss efforts. She has agreed to the Category 4 Plan.   Exercise goals: All adults should avoid inactivity. Some physical activity is better than none, and adults who participate in any amount of physical activity gain some health benefits.  Behavioral modification strategies: increasing lean protein intake, meal planning and cooking strategies, keeping healthy foods  in the home, and planning for success.  Catherine Christian has agreed to follow-up with our clinic in 4 weeks. She was informed of the importance of frequent follow-up visits to maximize her success with intensive lifestyle modifications for her multiple health conditions.   Objective:   Blood pressure 100/66, pulse 81, temperature 98.8 F (37.1 C), height '5\' 4"'$  (1.626 m), weight 234 lb (106.1 kg), last menstrual period 09/15/2022, SpO2 97 %, currently breastfeeding. Body mass index is 40.17 kg/m.  General: Cooperative, alert, well developed, in no acute distress. HEENT: Conjunctivae and lids unremarkable. Cardiovascular: Regular rhythm.  Lungs: Normal work of breathing. Neurologic: No focal deficits.   Lab Results  Component Value Date   CREATININE 0.63 09/23/2022   BUN 15 09/23/2022   NA 144 09/23/2022   K 4.3 09/23/2022   CL 105 09/23/2022   CO2 21 09/23/2022   Lab Results  Component Value Date   ALT 26 09/23/2022   AST 21 09/23/2022   ALKPHOS 66 09/23/2022   BILITOT 0.4 09/23/2022   Lab Results  Component Value Date   HGBA1C 5.7 (H) 09/23/2022   HGBA1C 5.4 04/30/2021   HGBA1C 5.5 11/02/2020   Lab Results  Component Value Date   INSULIN 8.0 09/23/2022   INSULIN 11.0 04/30/2021   INSULIN 7.0 11/02/2020   INSULIN 14.3 06/26/2020   Lab Results  Component Value Date   TSH 1.650 06/26/2020   Lab Results  Component Value Date   CHOL 159 06/26/2020   HDL 63 06/26/2020   LDLCALC 81 06/26/2020   TRIG 77 06/26/2020  Lab Results  Component Value Date   VD25OH 44.7 09/23/2022   VD25OH 49.2 04/30/2021   VD25OH 50.6 11/02/2020   Lab Results  Component Value Date   WBC 8.6 05/29/2022   HGB 10.9 (L) 05/29/2022   HCT 30.9 (L) 05/29/2022   MCV 88.5 05/29/2022   PLT 151 05/29/2022   No results found for: "IRON", "TIBC", "FERRITIN"  Attestation Statements:   Reviewed by clinician on day of visit: allergies, medications, problem list, medical history, surgical history,  family history, social history, and previous encounter notes.  I, Elnora Morrison, RMA am acting as transcriptionist for Coralie Common, MD.  I have reviewed the above documentation for accuracy and completeness, and I agree with the above. - Coralie Common, MD

## 2022-11-21 ENCOUNTER — Ambulatory Visit (INDEPENDENT_AMBULATORY_CARE_PROVIDER_SITE_OTHER): Payer: Medicaid Other | Admitting: Family Medicine

## 2022-11-21 ENCOUNTER — Encounter (INDEPENDENT_AMBULATORY_CARE_PROVIDER_SITE_OTHER): Payer: Self-pay | Admitting: Family Medicine

## 2022-11-21 VITALS — BP 112/80 | HR 90 | Temp 98.4°F | Ht 64.0 in | Wt 235.0 lb

## 2022-11-21 DIAGNOSIS — Z6841 Body Mass Index (BMI) 40.0 and over, adult: Secondary | ICD-10-CM

## 2022-11-21 DIAGNOSIS — E559 Vitamin D deficiency, unspecified: Secondary | ICD-10-CM | POA: Diagnosis not present

## 2022-11-21 DIAGNOSIS — E669 Obesity, unspecified: Secondary | ICD-10-CM | POA: Diagnosis not present

## 2022-11-21 DIAGNOSIS — R7303 Prediabetes: Secondary | ICD-10-CM

## 2022-11-21 NOTE — Progress Notes (Deleted)
Last few weeks have been more difficult.  She had a GI illness where she really struggled with a diarrheal type illness for 4-5 days.  She couldn't really eat much during that time and when she finally was able to get enough calories it wasn't protein sufficient.  Her kids then got sick and she caught whatever illness they had.  She feels slightly better this week but still not at 100%. Thinks she will stick to the Cat 4 for the next few weeks.  She is still nursing and is feeling hungry particularly in the middle of the night.  Wants to start exercising.

## 2022-12-02 NOTE — Progress Notes (Signed)
Chief Complaint:   OBESITY Catherine Christian is here to discuss her progress with her obesity treatment plan along with follow-up of her obesity related diagnoses. Catherine Christian is on the Category 4 Plan and states she is following her eating plan approximately 60% of the time. Catherine Christian states she is chasing kids.  Today's visit was #: 41 Starting weight: 236 LBS Starting date: 06/26/2020 Today's weight: 235 LBS Today's date: 11/21/2022 Total lbs lost to date: 1 LB Total lbs lost since last in-office visit: +1 LB  Interim History:  Last few weeks have been more difficult.  She had a GI illness where she really struggled with a diarrheal type illness for 4-5 days.  She couldn't really eat much during that time and when she finally was able to get enough calories it wasn't protein sufficient.  Her kids then got sick and she caught whatever illness they had.  She feels slightly better this week but still not at 100%. Thinks she will stick to the Cat 4 for the next few weeks.  She is still nursing and is feeling hungry particularly in the middle of the night.  Wants to start exercising.   Subjective:   1. Prediabetes A1c after pregnancy was 57.  Patient is not on any medications.  2. Vitamin D deficiency Patient is on OTC 5000 IU daily.  Assessment/Plan:   1. Prediabetes Continue current category 4 meal plan.  We will recheck labs in June or July 2024.  2. Vitamin D deficiency Continue OTC vitamin D.  3. BMI 40.0-44.9, adult ([place])  4. Obesity with starting BMI of 40.5 Catherine Christian is currently in the action stage of change. As such, her goal is to continue with weight loss efforts. She has agreed to the Category 4 Plan +200 to 300 cal.  Exercise goals:  As is.  Behavioral modification strategies: increasing lean protein intake, meal planning and cooking strategies, keeping healthy foods in the home, and planning for success.  Catherine Christian has agreed to follow-up with our clinic in 4 weeks. She was  informed of the importance of frequent follow-up visits to maximize her success with intensive lifestyle modifications for her multiple health conditions.   Objective:   Blood pressure 112/80, pulse 90, temperature 98.4 F (36.9 C), height 5\' 4"  ([id number] m), weight 235 lb (106.6 kg), SpO2 96 %, currently breastfeeding. Body mass index is 40.34 kg/m.  General: Cooperative, alert, well developed, in no acute distress. HEENT: Conjunctivae and lids unremarkable. Cardiovascular: Regular rhythm.  Lungs: Normal work of breathing. Neurologic: No focal deficits.   Lab Results  Component Value Date   CREATININE 0.63 09/23/2022   BUN 15 09/23/2022   NA 144 09/23/2022   K 4.3 09/23/2022   CL 105 09/23/2022   CO2 21 09/23/2022   Lab Results  Component Value Date   ALT 26 09/23/2022   AST 21 09/23/2022   ALKPHOS 66 09/23/2022   BILITOT 0.4 09/23/2022   Lab Results  Component Value Date   HGBA1C 5.7 (H) 09/23/2022   HGBA1C 5.4 04/30/2021   HGBA1C 5.5 11/02/2020   Lab Results  Component Value Date   INSULIN 8.0 09/23/2022   INSULIN 11.0 04/30/2021   INSULIN 7.0 11/02/2020   INSULIN 14.3 06/26/2020   Lab Results  Component Value Date   TSH 1.650 06/26/2020   Lab Results  Component Value Date   CHOL 159 06/26/2020   HDL 63 06/26/2020   LDLCALC 81 06/26/2020   TRIG 77 06/26/2020   Lab  Results  Component Value Date   VD25OH 44.7 09/23/2022   VD25OH 49.2 04/30/2021   VD25OH 50.6 11/02/2020   Lab Results  Component Value Date   WBC 8.6 05/29/2022   HGB 10.9 (L) 05/29/2022   HCT 30.9 (L) 05/29/2022   MCV 88.5 05/29/2022   PLT 151 05/29/2022   No results found for: "IRON", "TIBC", "FERRITIN"  Attestation Statements:   Reviewed by clinician on day of visit: allergies, medications, problem list, medical history, surgical history, family history, social history, and previous encounter notes.  I, Davy Pique, RMA, am acting as transcriptionist for Coralie Common,  MD.  I have reviewed the above documentation for accuracy and completeness, and I agree with the above. - Coralie Common, MD

## 2022-12-23 ENCOUNTER — Encounter (INDEPENDENT_AMBULATORY_CARE_PROVIDER_SITE_OTHER): Payer: Self-pay | Admitting: Family Medicine

## 2022-12-23 ENCOUNTER — Telehealth (INDEPENDENT_AMBULATORY_CARE_PROVIDER_SITE_OTHER): Payer: Medicaid Other | Admitting: Family Medicine

## 2022-12-23 DIAGNOSIS — Z6841 Body Mass Index (BMI) 40.0 and over, adult: Secondary | ICD-10-CM | POA: Diagnosis not present

## 2022-12-23 DIAGNOSIS — M79672 Pain in left foot: Secondary | ICD-10-CM

## 2022-12-23 DIAGNOSIS — R7303 Prediabetes: Secondary | ICD-10-CM | POA: Diagnosis not present

## 2022-12-23 NOTE — Progress Notes (Signed)
TeleHealth Visit:  This visit was completed with telemedicine (audio/video) technology. Catherine Christian has verbally consented to this TeleHealth visit. The patient is located at home, the provider is located at home. The participants in this visit include the listed provider and patient. The visit was conducted today via MyChart video.  OBESITY Catherine Christian is here to discuss her progress with her obesity treatment plan along with follow-up of her obesity related diagnoses.   Today's visit was # 29 Starting weight: 236 lbs Starting date: 06/26/2020 Weight at last in office visit: 235 lbs on 11/21/22 Total weight loss: 1 lbs at last in office visit on 11/21/22. Today's reported weight (12/23/22): none reported   Nutrition Plan: the Category 4 plan +200-300 cal  Current exercise:  none  Interim History:  Catherine Christian for started our program in October 2021 and lost 35 pounds.  Start weight was 236 pounds.  Prior to that her high weight was 270 pounds.  She then became pregnant with her daughter and regained all of the weight she had lost.  Her daughter is 70 months old and her son is 3.  She is a stay-at-home mom.  She is breastfeeding and goes through periods of feeling very hungry-usually mid afternoon or the middle of the night.. She says when she follows the plan exactly she seems to gain weight which is very frustrating for her.  She is not always using the extra 200-300 cal added last office visit.  She tends to eat breakfast and lunch on plan.  Dinner can vary. Her husband is also a patient at the clinic.  Has not started exercise due to severe left foot pain.  Eating all of the prescribed protein: yes Skipping meals: no Drinking adequate water: Yes Drinking sugar sweetened beverages: No  Assessment/Plan:  1.  Left foot pain Has had worsening pain in her left foot into her left great toe.  This has prevented exercise.  Has supportive shoes. Has an appointment with the  podiatrist.  Plan: Use frozen water bottle to ice foot. Check shoe size since feet 10 to enlarge with pregnancy. 3.  Follow-up with podiatry.  2. Prediabetes Last A1c was 5.7 on 09/23/2022.  Prior to that A1c had been 5.4 on 04/30/2021.  Had gestational diabetes. Medication(s): None Lab Results  Component Value Date   HGBA1C 5.7 (H) 09/23/2022   HGBA1C 5.4 04/30/2021   HGBA1C 5.5 11/02/2020   Lab Results  Component Value Date   INSULIN 8.0 09/23/2022   INSULIN 11.0 04/30/2021   INSULIN 7.0 11/02/2020   INSULIN 14.3 06/26/2020    Plan: Continue to work on adhering tightly to the plan. Avoid simple carbohydrates.   3. Morbid Obesity: Current BMI 40 Catherine Christian is currently in the action stage of change. As such, her goal is to continue with weight loss efforts.  She has agreed to the Category 4 plan +200-300 cal.  1.  Discussed ways to move food around during the day to alleviate hunger. 2.  Does not have to eat extra 200-300 cal unless she is hungry.   Exercise goals: No exercise has been prescribed at this time.  Behavioral modification strategies: increasing lean protein intake, meal planning , and planning for success.  Catherine Christian has agreed to follow-up with our clinic in 4 weeks.   No orders of the defined types were placed in this encounter.   There are no discontinued medications.   No orders of the defined types were placed in this encounter.     Objective:  VITALS: Per patient if applicable, see vitals. GENERAL: Alert and in no acute distress. CARDIOPULMONARY: No increased WOB. Speaking in clear sentences.  PSYCH: Pleasant and cooperative. Speech normal rate and rhythm. Affect is appropriate. Insight and judgement are appropriate. Attention is focused, linear, and appropriate.  NEURO: Oriented as arrived to appointment on time with no prompting.   Attestation Statements:   Reviewed by clinician on day of visit: allergies, medications, problem list, medical  history, surgical history, family history, social history, and previous encounter notes.  Total time spent on day of encounter: 30 minutes -  Jesse Sans, FNP  May include pre-charting, obtaining history and exam, counseling patient and family, ordering tests and medications, referring and communicating with other HCP, documenting, independently interpreting results, communicating results, and care coordination.   This was prepared with the assistance of Engineer, civil (consulting).  Occasional wrong-word or sound-a-like substitutions may have occurred due to the inherent limitations of voice recognition software.

## 2022-12-24 ENCOUNTER — Ambulatory Visit (INDEPENDENT_AMBULATORY_CARE_PROVIDER_SITE_OTHER): Payer: Medicaid Other | Admitting: Family Medicine

## 2022-12-24 ENCOUNTER — Ambulatory Visit (INDEPENDENT_AMBULATORY_CARE_PROVIDER_SITE_OTHER): Payer: Medicaid Other | Admitting: Podiatry

## 2022-12-24 ENCOUNTER — Ambulatory Visit (INDEPENDENT_AMBULATORY_CARE_PROVIDER_SITE_OTHER): Payer: Medicaid Other

## 2022-12-24 DIAGNOSIS — M7752 Other enthesopathy of left foot: Secondary | ICD-10-CM

## 2022-12-24 DIAGNOSIS — M21619 Bunion of unspecified foot: Secondary | ICD-10-CM | POA: Diagnosis not present

## 2022-12-24 DIAGNOSIS — M79672 Pain in left foot: Secondary | ICD-10-CM

## 2022-12-24 NOTE — Patient Instructions (Signed)
For inserts I like Powersteps, Superfeet, Aetrex  You can use VOLTAREN GEL in the area daily.   Plantar Fasciitis (Heel Spur Syndrome) with Rehab The plantar fascia is a fibrous, ligament-like, soft-tissue structure that spans the bottom of the foot. Plantar fasciitis is a condition that causes pain in the foot due to inflammation of the tissue. SYMPTOMS  Pain and tenderness on the underneath side of the foot. Pain that worsens with standing or walking. CAUSES  Plantar fasciitis is caused by irritation and injury to the plantar fascia on the underneath side of the foot. Common mechanisms of injury include: Direct trauma to bottom of the foot. Damage to a small nerve that runs under the foot where the main fascia attaches to the heel bone. Stress placed on the plantar fascia due to bone spurs. RISK INCREASES WITH:  Activities that place stress on the plantar fascia (running, jumping, pivoting, or cutting). Poor strength and flexibility. Improperly fitted shoes. Tight calf muscles. Flat feet. Failure to warm-up properly before activity. Obesity. PREVENTION Warm up and stretch properly before activity. Allow for adequate recovery between workouts. Maintain physical fitness: Strength, flexibility, and endurance. Cardiovascular fitness. Maintain a health body weight. Avoid stress on the plantar fascia. Wear properly fitted shoes, including arch supports for individuals who have flat feet.  PROGNOSIS  If treated properly, then the symptoms of plantar fasciitis usually resolve without surgery. However, occasionally surgery is necessary.  RELATED COMPLICATIONS  Recurrent symptoms that may result in a chronic condition. Problems of the lower back that are caused by compensating for the injury, such as limping. Pain or weakness of the foot during push-off following surgery. Chronic inflammation, scarring, and partial or complete fascia tear, occurring more often from repeated  injections.  TREATMENT  Treatment initially involves the use of ice and medication to help reduce pain and inflammation. The use of strengthening and stretching exercises may help reduce pain with activity, especially stretches of the Achilles tendon. These exercises may be performed at home or with a therapist. Your caregiver may recommend that you use heel cups of arch supports to help reduce stress on the plantar fascia. Occasionally, corticosteroid injections are given to reduce inflammation. If symptoms persist for greater than 6 months despite non-surgical (conservative), then surgery may be recommended.   MEDICATION  If pain medication is necessary, then nonsteroidal anti-inflammatory medications, such as aspirin and ibuprofen, or other minor pain relievers, such as acetaminophen, are often recommended. Do not take pain medication within 7 days before surgery. Prescription pain relievers may be given if deemed necessary by your caregiver. Use only as directed and only as much as you need. Corticosteroid injections may be given by your caregiver. These injections should be reserved for the most serious cases, because they may only be given a certain number of times.  HEAT AND COLD Cold treatment (icing) relieves pain and reduces inflammation. Cold treatment should be applied for 10 to 15 minutes every 2 to 3 hours for inflammation and pain and immediately after any activity that aggravates your symptoms. Use ice packs or massage the area with a piece of ice (ice massage). Heat treatment may be used prior to performing the stretching and strengthening activities prescribed by your caregiver, physical therapist, or athletic trainer. Use a heat pack or soak the injury in warm water.  SEEK IMMEDIATE MEDICAL CARE IF: Treatment seems to offer no benefit, or the condition worsens. Any medications produce adverse side effects.  EXERCISES- RANGE OF MOTION (ROM) AND STRETCHING EXERCISES -  Plantar  Fasciitis (Heel Spur Syndrome) These exercises may help you when beginning to rehabilitate your injury. Your symptoms may resolve with or without further involvement from your physician, physical therapist or athletic trainer. While completing these exercises, remember:  Restoring tissue flexibility helps normal motion to return to the joints. This allows healthier, less painful movement and activity. An effective stretch should be held for at least 30 seconds. A stretch should never be painful. You should only feel a gentle lengthening or release in the stretched tissue.  RANGE OF MOTION - Toe Extension, Flexion Sit with your right / left leg crossed over your opposite knee. Grasp your toes and gently pull them back toward the top of your foot. You should feel a stretch on the bottom of your toes and/or foot. Hold this stretch for 10 seconds. Now, gently pull your toes toward the bottom of your foot. You should feel a stretch on the top of your toes and or foot. Hold this stretch for 10 seconds. Repeat  times. Complete this stretch 3 times per day.   RANGE OF MOTION - Ankle Dorsiflexion, Active Assisted Remove shoes and sit on a chair that is preferably not on a carpeted surface. Place right / left foot under knee. Extend your opposite leg for support. Keeping your heel down, slide your right / left foot back toward the chair until you feel a stretch at your ankle or calf. If you do not feel a stretch, slide your bottom forward to the edge of the chair, while still keeping your heel down. Hold this stretch for 10 seconds. Repeat 3 times. Complete this stretch 2 times per day.   STRETCH  Gastroc, Standing Place hands on wall. Extend right / left leg, keeping the front knee somewhat bent. Slightly point your toes inward on your back foot. Keeping your right / left heel on the floor and your knee straight, shift your weight toward the wall, not allowing your back to arch. You should feel a  gentle stretch in the right / left calf. Hold this position for 10 seconds. Repeat 3 times. Complete this stretch 2 times per day.  STRETCH  Soleus, Standing Place hands on wall. Extend right / left leg, keeping the other knee somewhat bent. Slightly point your toes inward on your back foot. Keep your right / left heel on the floor, bend your back knee, and slightly shift your weight over the back leg so that you feel a gentle stretch deep in your back calf. Hold this position for 10 seconds. Repeat 3 times. Complete this stretch 2 times per day.  STRETCH  Gastrocsoleus, Standing  Note: This exercise can place a lot of stress on your foot and ankle. Please complete this exercise only if specifically instructed by your caregiver.  Place the ball of your right / left foot on a step, keeping your other foot firmly on the same step. Hold on to the wall or a rail for balance. Slowly lift your other foot, allowing your body weight to press your heel down over the edge of the step. You should feel a stretch in your right / left calf. Hold this position for 10 seconds. Repeat this exercise with a slight bend in your right / left knee. Repeat 3 times. Complete this stretch 2 times per day.   STRENGTHENING EXERCISES - Plantar Fasciitis (Heel Spur Syndrome)  These exercises may help you when beginning to rehabilitate your injury. They may resolve your symptoms with or without  further involvement from your physician, physical therapist or athletic trainer. While completing these exercises, remember:  Muscles can gain both the endurance and the strength needed for everyday activities through controlled exercises. Complete these exercises as instructed by your physician, physical therapist or athletic trainer. Progress the resistance and repetitions only as guided.  STRENGTH - Towel Curls Sit in a chair positioned on a non-carpeted surface. Place your foot on a towel, keeping your heel on the  floor. Pull the towel toward your heel by only curling your toes. Keep your heel on the floor. Repeat 3 times. Complete this exercise 2 times per day.  STRENGTH - Ankle Inversion Secure one end of a rubber exercise band/tubing to a fixed object (table, pole). Loop the other end around your foot just before your toes. Place your fists between your knees. This will focus your strengthening at your ankle. Slowly, pull your big toe up and in, making sure the band/tubing is positioned to resist the entire motion. Hold this position for 10 seconds. Have your muscles resist the band/tubing as it slowly pulls your foot back to the starting position. Repeat 3 times. Complete this exercises 2 times per day.  Document Released: 09/02/2005 Document Revised: 11/25/2011 Document Reviewed: 12/15/2008 [facility] Patient Information 2014 [place], [place].

## 2022-12-24 NOTE — Progress Notes (Unsigned)
Subjective:   Patient ID: Catherine Christian, female   DOB: 32 y.o.   MRN: [id number]   HPI Chief Complaint  Patient presents with   Foot Pain    Left foot pain along the big toe     Has had pain since being pregnant and she is now 32 months old. She has tried chaning shoes and it is constantly getting worse. She is compensating for it and it is starting to affect her low back. No injury.  She is walking on the outside. It hurts mostly in the big toe, poingint to he the prxoimal aspect. She gets shooting pains. She has tried motrin or tylenol, no help. No swelling. Pressure on the big toe hurts. Its always the big toe but sometimes the middle.   She is brestfeeging.    ROS      Objective:  Physical Exam  ***     Assessment:  ***     Plan:  ***    -short boot to immobilize

## 2023-01-23 ENCOUNTER — Ambulatory Visit (INDEPENDENT_AMBULATORY_CARE_PROVIDER_SITE_OTHER): Payer: Medicaid Other | Admitting: Family Medicine

## 2023-01-23 ENCOUNTER — Encounter (INDEPENDENT_AMBULATORY_CARE_PROVIDER_SITE_OTHER): Payer: Self-pay | Admitting: Family Medicine

## 2023-01-23 VITALS — BP 93/69 | HR 84 | Temp 98.9°F | Ht 64.0 in | Wt 238.0 lb

## 2023-01-23 DIAGNOSIS — Z6841 Body Mass Index (BMI) 40.0 and over, adult: Secondary | ICD-10-CM

## 2023-01-23 DIAGNOSIS — M79672 Pain in left foot: Secondary | ICD-10-CM | POA: Diagnosis not present

## 2023-01-23 DIAGNOSIS — E669 Obesity, unspecified: Secondary | ICD-10-CM

## 2023-01-23 DIAGNOSIS — E559 Vitamin D deficiency, unspecified: Secondary | ICD-10-CM

## 2023-01-23 DIAGNOSIS — R7303 Prediabetes: Secondary | ICD-10-CM | POA: Diagnosis not present

## 2023-01-23 NOTE — Progress Notes (Signed)
Chief Complaint:   OBESITY Catherine Christian is here to discuss her progress with her obesity treatment plan along with follow-up of her obesity related diagnoses. Catherine Christian is on the Category 4 Plan + 200-300 calories and states she is following her eating plan approximately 40% of the time. Catherine Christian states she is doing 0 minutes 0 times per week.  Today's visit was #: 30 Starting weight: 236 lbs Starting date: 06/26/2020 Today's weight: 238 lbs Today's date: 01/23/2023 Total lbs lost to date: 0 Total lbs lost since last in-office visit: 0  Interim History: Patient has been busy and recognizes she hasn't been as hungry so likely hasn't been getting enough protein and calories.  She feels like she knows she can do better.  Mentions this is a busy summer- every weekend she and her family have plans.  She recognizes that means she needs to food prep and plan.    Subjective:   1. Left foot pain Catherine Christian is now wearing a boot with minimal improvement in pain.  She saw podiatry.  2. Prediabetes Catherine Christian's last A1c was 5.7.  She is not on medications.  3. Vitamin D deficiency Catherine Christian is on OTC vitamin D, and she notes fatigue.  Her last vitamin D level was 44.7.  Assessment/Plan:   1. Left foot pain Catherine Christian will continue to follow-up with podiatry, and discussed the need for a more long-term plan.  2. Prediabetes We will recheck labs in 1 month.  3. Vitamin D deficiency We will recheck labs in 1 month.  4. BMI 40.0-44.9, adult (HCC)  5. Obesity with starting BMI of 40.5 Catherine Christian is currently in the action stage of change. As such, her goal is to continue with weight loss efforts. She has agreed to the Category 4 Plan.   Exercise goals: All adults should avoid inactivity. Some physical activity is better than none, and adults who participate in any amount of physical activity gain some health benefits.  Behavioral modification strategies: increasing lean protein intake, meal planning and  cooking strategies, keeping healthy foods in the home, and planning for success.  Catherine Christian has agreed to follow-up with our clinic in 4 weeks. She was informed of the importance of frequent follow-up visits to maximize her success with intensive lifestyle modifications for her multiple health conditions.   Objective:   Blood pressure 93/69, pulse 84, temperature 98.9 F (37.2 C), height 5\' 4"  (1.626 m), weight 238 lb (108 kg), SpO2 95 %, currently breastfeeding. Body mass index is 40.85 kg/m.  General: Cooperative, alert, well developed, in no acute distress. HEENT: Conjunctivae and lids unremarkable. Cardiovascular: Regular rhythm.  Lungs: Normal work of breathing. Neurologic: No focal deficits.   Lab Results  Component Value Date   CREATININE 0.63 09/23/2022   BUN 15 09/23/2022   NA 144 09/23/2022   K 4.3 09/23/2022   CL 105 09/23/2022   CO2 21 09/23/2022   Lab Results  Component Value Date   ALT 26 09/23/2022   AST 21 09/23/2022   ALKPHOS 66 09/23/2022   BILITOT 0.4 09/23/2022   Lab Results  Component Value Date   HGBA1C 5.7 (H) 09/23/2022   HGBA1C 5.4 04/30/2021   HGBA1C 5.5 11/02/2020   Lab Results  Component Value Date   INSULIN 8.0 09/23/2022   INSULIN 11.0 04/30/2021   INSULIN 7.0 11/02/2020   INSULIN 14.3 06/26/2020   Lab Results  Component Value Date   TSH 1.650 06/26/2020   Lab Results  Component Value Date  CHOL 159 06/26/2020   HDL 63 06/26/2020   LDLCALC 81 06/26/2020   TRIG 77 06/26/2020   Lab Results  Component Value Date   VD25OH 44.7 09/23/2022   VD25OH 49.2 04/30/2021   VD25OH 50.6 11/02/2020   Lab Results  Component Value Date   WBC 8.6 05/29/2022   HGB 10.9 (L) 05/29/2022   HCT 30.9 (L) 05/29/2022   MCV 88.5 05/29/2022   PLT 151 05/29/2022   No results found for: "IRON", "TIBC", "FERRITIN"  Attestation Statements:   Reviewed by clinician on day of visit: allergies, medications, problem list, medical history, surgical  history, family history, social history, and previous encounter notes.  Time spent on visit including pre-visit chart review and post-visit care and charting was 25 minutes.   I, Burt Knack, am acting as transcriptionist for Reuben Likes, MD.  I have reviewed the above documentation for accuracy and completeness, and I agree with the above. - Reuben Likes, MD

## 2023-02-05 ENCOUNTER — Ambulatory Visit (INDEPENDENT_AMBULATORY_CARE_PROVIDER_SITE_OTHER): Payer: Medicaid Other | Admitting: Podiatry

## 2023-02-05 DIAGNOSIS — M7752 Other enthesopathy of left foot: Secondary | ICD-10-CM | POA: Diagnosis not present

## 2023-02-05 MED ORDER — METHYLPREDNISOLONE 4 MG PO TBPK: ORAL_TABLET | ORAL | 0 refills | Status: DC

## 2023-02-05 MED ORDER — MELOXICAM 15 MG PO TABS: 15.0000 mg | ORAL_TABLET | Freq: Every day | ORAL | 1 refills | Status: DC

## 2023-02-05 MED ORDER — BETAMETHASONE SOD PHOS & ACET 6 (3-3) MG/ML IJ SUSP
3.0000 mg | Freq: Once | INTRAMUSCULAR | Status: AC
  Administered 2023-02-05: 3 mg via INTRA_ARTICULAR

## 2023-02-05 NOTE — Progress Notes (Signed)
Chief Complaint  Patient presents with   Toe Pain    Patient came in today for left foot pain, patient now mainly having pain in the hallux, 9 out of 10, dull, sharp pain while walking, TX: Cam Boot,     HPI: 32 y.o. female presenting today for follow-up evaluation of pain and tenderness associated to the left foot that began when she was pregnant with her second child.  This was about 1.5 years ago.  Currently her youngest is now 8 months.  Patient last seen in the office with Dr. Ardelle Anton on 12/24/2022 and at that time cam boot was dispensed.  Discussed conservative treatment options.  She has been wearing the cam boot with minimal improvement.  She presents today for follow-up treatment and evaluation  Brief history: Patient states that during pregnancy she began to develop left foot pain.  Idiopathic onset.  She also noted some increased swelling which she attributed to the pregnancy.  She assumed that the pain would resolve over time but she has continued to have persistent pain to this area.  She occasionally gets sharp shooting stabbing sensations to the first intermetatarsal webspace of the left foot which can be elicited with massage or when she bumps her toe.  It will be a sharp stabbing sensation for about 30 seconds and then immediately resolved.  This is also been going on for over 1 year now  Past Medical History:  Diagnosis Date   Adnexal cyst    Asthma    Childhood, exercise induced   Asthma    Back pain    Bilateral swelling of feet    Constipation    Gall bladder disease    Gestational diabetes    Gestational diabetes mellitus (GDM) requiring insulin 05/28/2022   Headache    Hepatic steatosis    Migraines    Multiple food allergies    Prediabetes    Preeclampsia    Seasonal allergies     Past Surgical History:  Procedure Laterality Date   APPENDECTOMY     CESAREAN SECTION     CESAREAN SECTION N/A 04/04/2020   Procedure: CESAREAN SECTION;  Surgeon: Myna Hidalgo,  DO;  Location: [facility];  Service: Obstetrics;  Laterality: N/A;   CESAREAN SECTION N/A 05/28/2022   Procedure: CESAREAN SECTION;  Surgeon: Steva Ready, DO;  Location: [facility];  Service: Obstetrics;  Laterality: N/A;   CHOLECYSTECTOMY  02/2016   CYST EXCISION  age 10   neck    Allergies  Allergen Reactions   Cabbage Nausea Only     Physical Exam: General: The patient is alert and oriented x3 in no acute distress.  Dermatology: Skin is warm, dry and supple bilateral lower extremities.   Vascular: Palpable pedal pulses bilaterally. Capillary refill within normal limits.  No appreciable edema.  No erythema.  Neurological: Grossly intact via light touch  Musculoskeletal Exam: No pedal deformities noted.  There is some tenderness with palpation around the plantar medial aspect of the left foot just proximal to the sesamoid apparatus.  There is also some tenderness with palpation to the first intermetatarsal webspace of the left foot.  Radiographic Exam LT foot 12/24/2022:  Normal osseous mineralization. Joint spaces preserved.  No fractures or osseous irregularities noted.  Impression: Negative  Assessment/Plan of Care: 1.  Possible intrinsic muscular tendinitis along the plantar medial arch of the left foot; constant dull ache 2.  Intermittent neuritis intermetatarsal webspace left foot  -Patient evaluated -I do believe the  patient would benefit from arch supports to support the medial longitudinal arch of the foot and offload pressure from the areas of sensitivity and pain.  OTC prefabricated PowerStep insoles were dispensed today.  Wear daily with good supportive tennis shoes -Injection of 0.5 cc Celestone Soluspan injected around the plantar medial aspect of the left foot just proximal to the sesamoid apparatus that at the most focused area of pain -Prescription for Medrol Dosepak -Prescription for meloxicam 15 mg daily after completion of the Dosepak -Patient may discontinue  the cam boot -Return to clinic 4 weeks follow-up       Felecia Shelling, DPM [facility]  Dr. Felecia Shelling, DPM    [address] [place], [place] [address]                Office [phone]  Fax [phone]

## 2023-02-25 ENCOUNTER — Encounter (INDEPENDENT_AMBULATORY_CARE_PROVIDER_SITE_OTHER): Payer: Self-pay | Admitting: Family Medicine

## 2023-02-25 ENCOUNTER — Ambulatory Visit (INDEPENDENT_AMBULATORY_CARE_PROVIDER_SITE_OTHER): Payer: Medicaid Other | Admitting: Family Medicine

## 2023-02-25 VITALS — BP 116/81 | HR 105 | Temp 99.2°F | Ht 64.0 in | Wt 241.0 lb

## 2023-02-25 DIAGNOSIS — E669 Obesity, unspecified: Secondary | ICD-10-CM

## 2023-02-25 DIAGNOSIS — M79672 Pain in left foot: Secondary | ICD-10-CM

## 2023-02-25 DIAGNOSIS — Z6841 Body Mass Index (BMI) 40.0 and over, adult: Secondary | ICD-10-CM | POA: Diagnosis not present

## 2023-02-25 DIAGNOSIS — E559 Vitamin D deficiency, unspecified: Secondary | ICD-10-CM | POA: Diagnosis not present

## 2023-02-25 NOTE — Progress Notes (Signed)
Chief Complaint:   OBESITY Catherine Christian is here to discuss her progress with her obesity treatment plan along with follow-up of her obesity related diagnoses. Catherine Christian is on the Category 4 Plan and states she is following her eating plan approximately 65% of the time. Catherine Christian states she is doing 0 minutes 0 times per week.  Today's visit was #: 31 Starting weight: 236 lbs Starting date: 06/26/2020 Today's weight: 241 lbs Today's date: 02/25/2023 Total lbs lost to date: 0 Total lbs lost since last in-office visit: 0  Interim History: Patient's foot feels better- saw Dr. Logan Bores, got cortisone shot, was on prednisone, and is taking meloxicam.  As far as meal plan she feels she hasn't been as compliant and feels like hormonally she may not be slightly off.  She isn't keeping track of snack calories as much but isn't eating snacks that are different than what she did previously. She is eating 8oz at dinner and 4oz at lunch and a full breakfast.  She isn't eating fruit everyday.  May have been more compliant than 65%. She and her family are going to a family reunion this weekend and then going to the beach after that.   Subjective:   1. Vitamin D deficiency Patient is on a OTC vitamin D, and her last vitamin D level was below goal in January.  2. Left foot pain Patient notes her pain is better controlled with steroid injections and steroid and anti-inflammatory.  She is no longer in a boot.  Assessment/Plan:   1. Vitamin D deficiency Patient will continue OTC vitamin D with no change in dose.  2. Left foot pain Patient will follow-up with Dr. Logan Bores on 03/12/2023.  3. BMI 40.0-44.9, adult (HCC)  4. Obesity with starting BMI of 40.5 Catherine Christian is currently in the action stage of change. As such, her goal is to continue with weight loss efforts. She has agreed to the Category 4 Plan.   We will repeat fasting labs at her next appointment.   Exercise goals: All adults should avoid inactivity.  Some physical activity is better than none, and adults who participate in any amount of physical activity gain some health benefits.  Behavioral modification strategies: increasing lean protein intake, meal planning and cooking strategies, keeping healthy foods in the home, and planning for success.  Catherine Christian has agreed to follow-up with our clinic in 5 to 6 weeks. She was informed of the importance of frequent follow-up visits to maximize her success with intensive lifestyle modifications for her multiple health conditions.   Objective:   Blood pressure 116/81, pulse (!) 105, temperature 99.2 F (37.3 C), height 5\' 4"  (1.626 m), weight 241 lb (109.3 kg), SpO2 96 %, currently breastfeeding. Body mass index is 41.37 kg/m.  General: Cooperative, alert, well developed, in no acute distress. HEENT: Conjunctivae and lids unremarkable. Cardiovascular: Regular rhythm.  Lungs: Normal work of breathing. Neurologic: No focal deficits.   Lab Results  Component Value Date   CREATININE 0.63 09/23/2022   BUN 15 09/23/2022   NA 144 09/23/2022   K 4.3 09/23/2022   CL 105 09/23/2022   CO2 21 09/23/2022   Lab Results  Component Value Date   ALT 26 09/23/2022   AST 21 09/23/2022   ALKPHOS 66 09/23/2022   BILITOT 0.4 09/23/2022   Lab Results  Component Value Date   HGBA1C 5.7 (H) 09/23/2022   HGBA1C 5.4 04/30/2021   HGBA1C 5.5 11/02/2020   Lab Results  Component Value Date  INSULIN 8.0 09/23/2022   INSULIN 11.0 04/30/2021   INSULIN 7.0 11/02/2020   INSULIN 14.3 06/26/2020   Lab Results  Component Value Date   TSH 1.650 06/26/2020   Lab Results  Component Value Date   CHOL 159 06/26/2020   HDL 63 06/26/2020   LDLCALC 81 06/26/2020   TRIG 77 06/26/2020   Lab Results  Component Value Date   VD25OH 44.7 09/23/2022   VD25OH 49.2 04/30/2021   VD25OH 50.6 11/02/2020   Lab Results  Component Value Date   WBC 8.6 05/29/2022   HGB 10.9 (L) 05/29/2022   HCT 30.9 (L) 05/29/2022    MCV 88.5 05/29/2022   PLT 151 05/29/2022   No results found for: "IRON", "TIBC", "FERRITIN"  Attestation Statements:   Reviewed by clinician on day of visit: allergies, medications, problem list, medical history, surgical history, family history, social history, and previous encounter notes.   I, Burt Knack, am acting as transcriptionist for Reuben Likes, MD.  I have reviewed the above documentation for accuracy and completeness, and I agree with the above. - Reuben Likes, MD

## 2023-03-11 ENCOUNTER — Ambulatory Visit: Payer: Medicaid Other | Admitting: Podiatry

## 2023-03-12 ENCOUNTER — Ambulatory Visit (INDEPENDENT_AMBULATORY_CARE_PROVIDER_SITE_OTHER): Payer: Medicaid Other | Admitting: Podiatry

## 2023-03-12 DIAGNOSIS — M7752 Other enthesopathy of left foot: Secondary | ICD-10-CM | POA: Diagnosis not present

## 2023-03-12 MED ORDER — BETAMETHASONE SOD PHOS & ACET 6 (3-3) MG/ML IJ SUSP
3.0000 mg | Freq: Once | INTRAMUSCULAR | Status: AC
  Administered 2023-03-12: 3 mg via INTRA_ARTICULAR

## 2023-03-12 NOTE — Progress Notes (Signed)
Chief Complaint  Patient presents with   Foot Pain    Patient came in today for left foot pain, follow-up, arch and hallux, "Doing much better" rate of pain 3 out of 10, TX: power steps, injection, medrol dosepak     HPI: 32 y.o. female presenting today for follow-up evaluation of pain and tenderness associated to the left foot that began when she was pregnant with her second child.  Patient states that the cortisone injection as well as the anti-inflammatory helped significantly to alleviate some of the forefoot pain.  She also finds the OTC power step insoles beneficial.  She continues to have some slight tenderness especially to the intermetatarsal space but overall improvement.  Brief history: Patient states that during pregnancy she began to develop left foot pain.  Idiopathic onset.  She also noted some increased swelling which she attributed to the pregnancy.  She assumed that the pain would resolve over time but she has continued to have persistent pain to this area.  She occasionally gets sharp shooting stabbing sensations to the first intermetatarsal webspace of the left foot which can be elicited with massage or when she bumps her toe.  It will be a sharp stabbing sensation for about 30 seconds and then immediately resolved.  This is also been going on for over 1 year now  Past Medical History:  Diagnosis Date   Adnexal cyst    Asthma    Childhood, exercise induced   Asthma    Back pain    Bilateral swelling of feet    Constipation    Gall bladder disease    Gestational diabetes    Gestational diabetes mellitus (GDM) requiring insulin 05/28/2022   Headache    Hepatic steatosis    Migraines    Multiple food allergies    Prediabetes    Preeclampsia    Seasonal allergies     Past Surgical History:  Procedure Laterality Date   APPENDECTOMY     CESAREAN SECTION     CESAREAN SECTION N/A 04/04/2020   Procedure: CESAREAN SECTION;  Surgeon: Myna Hidalgo, DO;  Location: [facility] LD  ORS;  Service: Obstetrics;  Laterality: N/A;   CESAREAN SECTION N/A 05/28/2022   Procedure: CESAREAN SECTION;  Surgeon: Steva Ready, DO;  Location: [facility] ORS;  Service: Obstetrics;  Laterality: N/A;   CHOLECYSTECTOMY  02/2016   CYST EXCISION  age 48   neck    Allergies  Allergen Reactions   Cabbage Nausea Only   Drospirenone-Ethinyl Estradiol Other (See Comments)     Physical Exam: General: The patient is alert and oriented x3 in no acute distress.  Dermatology: Skin is warm, dry and supple bilateral lower extremities.   Vascular: Palpable pedal pulses bilaterally. Capillary refill within normal limits.  No appreciable edema.  No erythema.  Neurological: Grossly intact via light touch  Musculoskeletal Exam: No pedal deformities noted.  Minimal tenderness along the plantar medial arch of the foot just proximal sesamoid apparatus.  There continues to be some tenderness with palpation of the first intermetatarsal space of the left foot.  Radiographic Exam LT foot 12/24/2022:  Normal osseous mineralization. Joint spaces preserved.  No fractures or osseous irregularities noted.  Impression: Negative  Assessment/Plan of Care: 1.  Possible intrinsic muscular tendinitis along the plantar medial arch of the left foot; constant dull ache 2.  Intermittent neuritis intermetatarsal webspace left foot  -Patient evaluated - Overall improvement -Injection of 0.5 cc Celestone Soluspan injected into the first intermetatarsal space of  the left foot -Continue meloxicam 15mg  daily -Continue OTC power step insoles -Turn to clinic as needed.  If the patient begins to have a regression of pain and symptoms I would recommend MRI.  Patient states that she will contact her office to order the MRI prior to her next follow-up visit if needed.     Catherine Christian, DPM [facility]  Dr. Felecia Christian, DPM    [address] [place], [place] [address]                 Office [phone]  Fax [phone]

## 2023-04-07 ENCOUNTER — Other Ambulatory Visit: Payer: Self-pay | Admitting: Podiatry

## 2023-04-08 ENCOUNTER — Encounter (INDEPENDENT_AMBULATORY_CARE_PROVIDER_SITE_OTHER): Payer: Self-pay | Admitting: Family Medicine

## 2023-04-08 ENCOUNTER — Ambulatory Visit (INDEPENDENT_AMBULATORY_CARE_PROVIDER_SITE_OTHER): Payer: Medicaid Other | Admitting: Family Medicine

## 2023-04-08 VITALS — BP 120/82 | HR 85 | Temp 98.4°F | Ht 64.0 in | Wt 243.0 lb

## 2023-04-08 DIAGNOSIS — R7303 Prediabetes: Secondary | ICD-10-CM

## 2023-04-08 DIAGNOSIS — E669 Obesity, unspecified: Secondary | ICD-10-CM

## 2023-04-08 DIAGNOSIS — Z6841 Body Mass Index (BMI) 40.0 and over, adult: Secondary | ICD-10-CM | POA: Diagnosis not present

## 2023-04-08 DIAGNOSIS — E559 Vitamin D deficiency, unspecified: Secondary | ICD-10-CM | POA: Diagnosis not present

## 2023-04-08 NOTE — Progress Notes (Unsigned)
Chief Complaint:   OBESITY Catherine Christian is here to discuss her progress with her obesity treatment plan along with follow-up of her obesity related diagnoses. Catherine Christian is on the Category 4 Plan and states she is following her eating plan approximately 60% of the time. Catherine Christian states she is at the gym for 60 minutes 1 time per week.  Today's visit was #: 32 Starting weight: 236 lbs Starting date: 06/26/2020 Today's weight: 243 lbs Today's date: 04/08/2023 Total lbs lost to date: 0 Total lbs lost since last in-office visit: 0  Interim History: Patient has had a very busy summer so far with weekends that are full of traveling or events.  She tries to stick to the plan during the week when she can.  She started going to the gym 1x a week with her MIL.  Her kids have been alternating being sick and now she is ill.   Subjective:   1. Prediabetes Patient's last A1c was 5.7 and insulin 8.0.  Last CMP with elevated blood sugar.  2. Vitamin D deficiency Patient is on a OTC vitamin D, and she notes fatigue.  Assessment/Plan:   1. Prediabetes We will check labs today, we will follow-up at patient's next appointment.  - Comprehensive metabolic panel - Hemoglobin A1c - Insulin, random  2. Vitamin D deficiency We will check labs today, and we will follow-up at patient's next appointment.  - VITAMIN D 25 Hydroxy (Vit-D Deficiency, Fractures)  3. BMI 40.0-44.9, adult (HCC)  4. Obesity with starting BMI of 40.5 Ines is currently in the action stage of change. As such, her goal is to continue with weight loss efforts. She has agreed to the Category 4 Plan and keeping a food journal and adhering to recommended goals of 1700-1900 calories and 125+ grams of protein daily.   Exercise goals: All adults should avoid inactivity. Some physical activity is better than none, and adults who participate in any amount of physical activity gain some health benefits.  Behavioral modification  strategies: increasing lean protein intake, meal planning and cooking strategies, keeping healthy foods in the home, planning for success, and keeping a strict food journal.  Catherine Christian has agreed to follow-up with our clinic in 4 to 5 weeks. She was informed of the importance of frequent follow-up visits to maximize her success with intensive lifestyle modifications for her multiple health conditions.   Catherine Christian was informed we would discuss her lab results at her next visit unless there is a critical issue that needs to be addressed sooner. Catherine Christian agreed to keep her next visit at the agreed upon time to discuss these results.  Objective:   Blood pressure 120/82, pulse 85, temperature 98.4 F (36.9 C), height 5\' 4"  (1.626 m), weight 243 lb (110.2 kg), SpO2 94%, currently breastfeeding. Body mass index is 41.71 kg/m.  General: Cooperative, alert, well developed, in no acute distress. HEENT: Conjunctivae and lids unremarkable. Cardiovascular: Regular rhythm.  Lungs: Normal work of breathing. Neurologic: No focal deficits.   Lab Results  Component Value Date   CREATININE 0.55 (L) 04/08/2023   BUN 18 04/08/2023   NA 141 04/08/2023   K 4.2 04/08/2023   CL 102 04/08/2023   CO2 22 04/08/2023   Lab Results  Component Value Date   ALT 20 04/08/2023   AST 18 04/08/2023   ALKPHOS 54 04/08/2023   BILITOT 0.3 04/08/2023   Lab Results  Component Value Date   HGBA1C 6.2 (H) 04/08/2023   HGBA1C 5.7 (H) 09/23/2022  HGBA1C 5.4 04/30/2021   HGBA1C 5.5 11/02/2020   Lab Results  Component Value Date   INSULIN 15.2 04/08/2023   INSULIN 8.0 09/23/2022   INSULIN 11.0 04/30/2021   INSULIN 7.0 11/02/2020   INSULIN 14.3 06/26/2020   Lab Results  Component Value Date   TSH 1.650 06/26/2020   Lab Results  Component Value Date   CHOL 159 06/26/2020   HDL 63 06/26/2020   LDLCALC 81 06/26/2020   TRIG 77 06/26/2020   Lab Results  Component Value Date   VD25OH 49.0 04/08/2023   VD25OH  44.7 09/23/2022   VD25OH 49.2 04/30/2021   Lab Results  Component Value Date   WBC 8.6 05/29/2022   HGB 10.9 (L) 05/29/2022   HCT 30.9 (L) 05/29/2022   MCV 88.5 05/29/2022   PLT 151 05/29/2022   No results found for: "IRON", "TIBC", "FERRITIN"  Attestation Statements:   Reviewed by clinician on day of visit: allergies, medications, problem list, medical history, surgical history, family history, social history, and previous encounter notes.   I, Burt Knack, am acting as transcriptionist for Reuben Likes, MD.  I have reviewed the above documentation for accuracy and completeness, and I agree with the above. - Reuben Likes, MD

## 2023-04-09 LAB — VITAMIN D 25 HYDROXY (VIT D DEFICIENCY, FRACTURES): Vit D, 25-Hydroxy: 49 ng/mL (ref 30.0–100.0)

## 2023-04-09 LAB — INSULIN, RANDOM: INSULIN: 15.2 u[IU]/mL (ref 2.6–24.9)

## 2023-04-09 LAB — HEMOGLOBIN A1C
Est. average glucose Bld gHb Est-mCnc: 131 mg/dL
Hgb A1c MFr Bld: 6.2 % — ABNORMAL HIGH (ref 4.8–5.6)

## 2023-04-09 LAB — COMPREHENSIVE METABOLIC PANEL
ALT: 20 IU/L (ref 0–32)
AST: 18 IU/L (ref 0–40)
Albumin: 4.3 g/dL (ref 3.9–4.9)
Alkaline Phosphatase: 54 IU/L (ref 44–121)
BUN/Creatinine Ratio: 33 — ABNORMAL HIGH (ref 9–23)
BUN: 18 mg/dL (ref 6–20)
Bilirubin Total: 0.3 mg/dL (ref 0.0–1.2)
CO2: 22 mmol/L (ref 20–29)
Calcium: 9 mg/dL (ref 8.7–10.2)
Chloride: 102 mmol/L (ref 96–106)
Creatinine, Ser: 0.55 mg/dL — ABNORMAL LOW (ref 0.57–1.00)
Globulin, Total: 2.3 g/dL (ref 1.5–4.5)
Glucose: 105 mg/dL — ABNORMAL HIGH (ref 70–99)
Potassium: 4.2 mmol/L (ref 3.5–5.2)
Sodium: 141 mmol/L (ref 134–144)
Total Protein: 6.6 g/dL (ref 6.0–8.5)
eGFR: 125 mL/min/{1.73_m2} (ref >59)

## 2023-05-13 ENCOUNTER — Encounter (INDEPENDENT_AMBULATORY_CARE_PROVIDER_SITE_OTHER): Payer: Self-pay | Admitting: Family Medicine

## 2023-05-13 ENCOUNTER — Ambulatory Visit (INDEPENDENT_AMBULATORY_CARE_PROVIDER_SITE_OTHER): Payer: Medicaid Other | Admitting: Family Medicine

## 2023-05-13 VITALS — BP 101/69 | HR 91 | Temp 98.6°F | Ht 64.0 in | Wt 243.0 lb

## 2023-05-13 DIAGNOSIS — Z6841 Body Mass Index (BMI) 40.0 and over, adult: Secondary | ICD-10-CM

## 2023-05-13 DIAGNOSIS — E669 Obesity, unspecified: Secondary | ICD-10-CM | POA: Diagnosis not present

## 2023-05-13 DIAGNOSIS — R7303 Prediabetes: Secondary | ICD-10-CM

## 2023-05-13 DIAGNOSIS — E559 Vitamin D deficiency, unspecified: Secondary | ICD-10-CM | POA: Diagnosis not present

## 2023-05-13 MED ORDER — METFORMIN HCL 500 MG PO TABS: 500.0000 mg | ORAL_TABLET | Freq: Every day | ORAL | 0 refills | Status: DC

## 2023-05-13 NOTE — Progress Notes (Signed)
Chief Complaint:   OBESITY Catherine Christian is here to discuss her progress with her obesity treatment plan along with follow-up of her obesity related diagnoses. Catherine Christian is on the Category 4 Plan and keeping a food journal and adhering to recommended goals of 1700-1900 calories and 125+ grams of protein and states she is following her eating plan approximately 75% of the time. Catherine Christian states she is working out for 15-60 minutes 2-3 times per week.  Today's visit was #: 33 Starting weight: 236 lbs Starting date: 06/26/2020 Today's weight: 243 lbs Today's date: 05/13/2023 Total lbs lost to date: 0 Total lbs lost since last in-office visit: 0  Interim History: Patient presents for follow up today.  Her foot is still taking meloxicam as needed for her foot pain.  She got labs done at last appointment and was disappointed with her A1c.  Her grandmother visited recently and she is a diabetic and patient does not want to progress thru prediabetes to diabetes.   Subjective:   1. Prediabetes Patient is recent A1c was 6.2 and insulin 15.2 (Up from 5.7 to 8.0).  Patient has a history of gestational diabetes 2 times.  I discussed labs with the patient today.  2. Vitamin D deficiency Patient is on OTC vitamin D.  She denies nausea, vomiting, or muscle weakness but notes fatigue.  Assessment/Plan:   1. Prediabetes Patient agreed to start metformin 500 mg once daily with a 90-day supply.  - metFORMIN (GLUCOPHAGE) 500 MG tablet; Take 1 tablet (500 mg total) by mouth daily with breakfast.  Dispense: 90 tablet; Refill: 0  2. Vitamin D deficiency Patient is to continue OTC vitamin D.  3. BMI 40.0-44.9, adult (HCC)  4. Obesity with starting BMI of 40.5 Catherine Christian is currently in the action stage of change. As such, her goal is to continue with weight loss efforts. She has agreed to keeping a food journal and adhering to recommended goals of 1900-2100 calories and 130+ grams of protein daily.   Exercise  goals: All adults should avoid inactivity. Some physical activity is better than none, and adults who participate in any amount of physical activity gain some health benefits.  Patient is to focus on resistance training when working out.  Behavioral modification strategies: increasing lean protein intake, meal planning and cooking strategies, keeping healthy foods in the home, and planning for success.  Catherine Christian has agreed to follow-up with our clinic in 5 weeks. She was informed of the importance of frequent follow-up visits to maximize her success with intensive lifestyle modifications for her multiple health conditions.   Objective:   Blood pressure 101/69, pulse 91, temperature 98.6 F (37 C), height 5\' 4"  (1.626 m), weight 243 lb (110.2 kg), SpO2 97%, currently breastfeeding. Body mass index is 41.71 kg/m.  General: Cooperative, alert, well developed, in no acute distress. HEENT: Conjunctivae and lids unremarkable. Cardiovascular: Regular rhythm.  Lungs: Normal work of breathing. Neurologic: No focal deficits.   Lab Results  Component Value Date   CREATININE 0.55 (L) 04/08/2023   BUN 18 04/08/2023   NA 141 04/08/2023   K 4.2 04/08/2023   CL 102 04/08/2023   CO2 22 04/08/2023   Lab Results  Component Value Date   ALT 20 04/08/2023   AST 18 04/08/2023   ALKPHOS 54 04/08/2023   BILITOT 0.3 04/08/2023   Lab Results  Component Value Date   HGBA1C 6.2 (H) 04/08/2023   HGBA1C 5.7 (H) 09/23/2022   HGBA1C 5.4 04/30/2021   HGBA1C  5.5 11/02/2020   Lab Results  Component Value Date   INSULIN 15.2 04/08/2023   INSULIN 8.0 09/23/2022   INSULIN 11.0 04/30/2021   INSULIN 7.0 11/02/2020   INSULIN 14.3 06/26/2020   Lab Results  Component Value Date   TSH 1.650 06/26/2020   Lab Results  Component Value Date   CHOL 159 06/26/2020   HDL 63 06/26/2020   LDLCALC 81 06/26/2020   TRIG 77 06/26/2020   Lab Results  Component Value Date   VD25OH 49.0 04/08/2023   VD25OH 44.7  09/23/2022   VD25OH 49.2 04/30/2021   Lab Results  Component Value Date   WBC 8.6 05/29/2022   HGB 10.9 (L) 05/29/2022   HCT 30.9 (L) 05/29/2022   MCV 88.5 05/29/2022   PLT 151 05/29/2022   No results found for: "IRON", "TIBC", "FERRITIN"  Attestation Statements:   Reviewed by clinician on day of visit: allergies, medications, problem list, medical history, surgical history, family history, social history, and previous encounter notes.   I, Burt Knack, am acting as transcriptionist for Reuben Likes, MD. I have reviewed the above documentation for accuracy and completeness, and I agree with the above. - Reuben Likes, MD

## 2023-06-13 ENCOUNTER — Other Ambulatory Visit (INDEPENDENT_AMBULATORY_CARE_PROVIDER_SITE_OTHER): Payer: Self-pay | Admitting: Family Medicine

## 2023-06-13 ENCOUNTER — Other Ambulatory Visit: Payer: Self-pay | Admitting: Podiatry

## 2023-06-13 DIAGNOSIS — R7303 Prediabetes: Secondary | ICD-10-CM

## 2023-06-17 ENCOUNTER — Encounter (INDEPENDENT_AMBULATORY_CARE_PROVIDER_SITE_OTHER): Payer: Self-pay | Admitting: Family Medicine

## 2023-06-17 ENCOUNTER — Ambulatory Visit (INDEPENDENT_AMBULATORY_CARE_PROVIDER_SITE_OTHER): Payer: Managed Care, Other (non HMO) | Admitting: Family Medicine

## 2023-06-17 VITALS — BP 115/70 | HR 97 | Temp 99.2°F | Ht 64.0 in | Wt 242.0 lb

## 2023-06-17 DIAGNOSIS — R7303 Prediabetes: Secondary | ICD-10-CM

## 2023-06-17 DIAGNOSIS — E669 Obesity, unspecified: Secondary | ICD-10-CM | POA: Diagnosis not present

## 2023-06-17 DIAGNOSIS — E559 Vitamin D deficiency, unspecified: Secondary | ICD-10-CM

## 2023-06-17 DIAGNOSIS — Z6841 Body Mass Index (BMI) 40.0 and over, adult: Secondary | ICD-10-CM | POA: Diagnosis not present

## 2023-06-17 MED ORDER — METFORMIN HCL 500 MG PO TABS: 500.0000 mg | ORAL_TABLET | Freq: Every day | ORAL | 0 refills | Status: DC

## 2023-06-17 NOTE — Progress Notes (Signed)
Chief Complaint:   OBESITY Catherine Christian is here to discuss her progress with her obesity treatment plan along with follow-up of her obesity related diagnoses. Catherine Christian is on keeping a food journal and adhering to recommended goals of 1900-2000 calories and 130+ grams of protein and states she is following her eating plan approximately 75-80% of the time. Catherine Christian states she is at the gym 2 times per week.   Today's visit was #: 34 Starting weight: 236 lbs Starting date: 06/26/2020 Today's weight: 242 lbs Today's date: 06/17/2023 Total lbs lost to date: 0 Total lbs lost since last in-office visit: 1  Interim History: Patient doing relatively well.  She felt significant decrease in food desire when restarting metformin.  Her cravings have almost completely disappeared.  She feels like she has more energy.  She is going to the gym 2x a week.  She was journaling well the first few weeks and was following the meal plan but then they celebrated her daughters 1st birthday and there was more indulgent food in the house.  She recognizes when the food is in the house she did indulge more frequently.  She and her family have lots of little activities and events over the fall.  She is hopeful to eat prior to the social events to give her more control.   Subjective:   1. Prediabetes Patient is noticing more control and decreased desire to eat since starting metformin.  2. Vitamin D deficiency Patient is on OTC vitamin D, and she notes fatigue.  Her last vitamin D level was of 49 toward the end of July.  Assessment/Plan:   1. Prediabetes We will refill metformin 500 mg once daily for 90 days.  - metFORMIN (GLUCOPHAGE) 500 MG tablet; Take 1 tablet (500 mg total) by mouth daily with breakfast.  Dispense: 90 tablet; Refill: 0  2. Vitamin D deficiency Patient will continue OTC vitamin D with no change in dose or quantity.  3. BMI 40.0-44.9, adult (HCC)  4. Obesity with starting BMI of 40.5 Catherine Christian  is currently in the action stage of change. As such, her goal is to continue with weight loss efforts. She has agreed to keeping a food journal and adhering to recommended goals of 1900-2100 calories and 130+ grams of protein daily.   Exercise goals: As is.   Behavioral modification strategies: increasing lean protein intake, meal planning and cooking strategies, keeping healthy foods in the home, and planning for success.  Catherine Christian has agreed to follow-up with our clinic in 4 weeks. She was informed of the importance of frequent follow-up visits to maximize her success with intensive lifestyle modifications for her multiple health conditions.   Objective:   Blood pressure 115/70, pulse 97, temperature 99.2 F (37.3 C), height 5\' 4"  (1.626 m), weight 242 lb (109.8 kg), SpO2 97%, currently breastfeeding. Body mass index is 41.54 kg/m.  General: Cooperative, alert, well developed, in no acute distress. HEENT: Conjunctivae and lids unremarkable. Cardiovascular: Regular rhythm.  Lungs: Normal work of breathing. Neurologic: No focal deficits.   Lab Results  Component Value Date   CREATININE 0.55 (L) 04/08/2023   BUN 18 04/08/2023   NA 141 04/08/2023   K 4.2 04/08/2023   CL 102 04/08/2023   CO2 22 04/08/2023   Lab Results  Component Value Date   ALT 20 04/08/2023   AST 18 04/08/2023   ALKPHOS 54 04/08/2023   BILITOT 0.3 04/08/2023   Lab Results  Component Value Date   HGBA1C 6.2 (  H) 04/08/2023   HGBA1C 5.7 (H) 09/23/2022   HGBA1C 5.4 04/30/2021   HGBA1C 5.5 11/02/2020   Lab Results  Component Value Date   INSULIN 15.2 04/08/2023   INSULIN 8.0 09/23/2022   INSULIN 11.0 04/30/2021   INSULIN 7.0 11/02/2020   INSULIN 14.3 06/26/2020   Lab Results  Component Value Date   TSH 1.650 06/26/2020   Lab Results  Component Value Date   CHOL 159 06/26/2020   HDL 63 06/26/2020   LDLCALC 81 06/26/2020   TRIG 77 06/26/2020   Lab Results  Component Value Date   VD25OH 49.0  04/08/2023   VD25OH 44.7 09/23/2022   VD25OH 49.2 04/30/2021   Lab Results  Component Value Date   WBC 8.6 05/29/2022   HGB 10.9 (L) 05/29/2022   HCT 30.9 (L) 05/29/2022   MCV 88.5 05/29/2022   PLT 151 05/29/2022   No results found for: "IRON", "TIBC", "FERRITIN"  Attestation Statements:   Reviewed by clinician on day of visit: allergies, medications, problem list, medical history, surgical history, family history, social history, and previous encounter notes.   I, Burt Knack, am acting as transcriptionist for Reuben Likes, MD.  I have reviewed the above documentation for accuracy and completeness, and I agree with the above. - Reuben Likes, MD

## 2023-07-28 ENCOUNTER — Encounter (INDEPENDENT_AMBULATORY_CARE_PROVIDER_SITE_OTHER): Payer: Self-pay | Admitting: Family Medicine

## 2023-07-28 ENCOUNTER — Ambulatory Visit (INDEPENDENT_AMBULATORY_CARE_PROVIDER_SITE_OTHER): Payer: Managed Care, Other (non HMO) | Admitting: Family Medicine

## 2023-07-28 VITALS — BP 106/77 | HR 95 | Temp 98.7°F | Ht 64.0 in | Wt 242.0 lb

## 2023-07-28 DIAGNOSIS — M79672 Pain in left foot: Secondary | ICD-10-CM

## 2023-07-28 DIAGNOSIS — R7303 Prediabetes: Secondary | ICD-10-CM

## 2023-07-28 DIAGNOSIS — E66813 Obesity, class 3: Secondary | ICD-10-CM

## 2023-07-28 DIAGNOSIS — Z6841 Body Mass Index (BMI) 40.0 and over, adult: Secondary | ICD-10-CM

## 2023-07-28 DIAGNOSIS — E669 Obesity, unspecified: Secondary | ICD-10-CM

## 2023-07-28 NOTE — Progress Notes (Signed)
SUBJECTIVE:  Chief Complaint: Obesity  Interim History: Patient applied for a scholarship at the [employer] and so she and her husband are trying to get into a rhythm with that and figure out what that looks like for her family.  Her foot is hurting again so she is planning to send her foot doctor a message. She is sticking to breakfast and lunch on the meal plan and figuring out what dinner looks like.  She is figuring out how to make dinner more consistently on plan. Thanksgiving she is planning to go to her father's.  She and her husband have discussed tactics for the holidays about limiting indulgent food.  For December holidays she and her family are going to celebrate with both sides of family alternating.  Catherine Christian is here to discuss her progress with her obesity treatment plan. She is on the keeping a food journal and adhering to recommended goals of 1900 to 2100 calories and 130+ grams of protein and states she is following her eating plan approximately 75 % of the time. She states she is exercising 45-60 minutes 2-3 times per week.   OBJECTIVE: Visit Diagnoses: Problem List Items Addressed This Visit       Other   Prediabetes - Primary    Patient voices that she is feeling more nauseous in the evenings and isn't sure if it related to metformin.  Does not think that is related to food macro composition.  Her last A1c was 6.2 which is significantly higher than it was previously.  History of gestational diabetes x2.  Patient to change metformin to nightly and see if that helps with nausea.  Will repeat labs in January.  Got a 90 day prescription in early October so does not need refill today.      Left foot pain    Patient experiencing more significant foot pain particularly in the last few weeks. She is going to reach out to Dr. Logan Bores for further management of foot pain.      Other Visit Diagnoses     BMI 40.0-44.9, adult (HCC)       Obesity with current BMI of 40.9            Vitals Temp: 98.7 F (37.1 C) BP: 106/77 Pulse Rate: 95 SpO2: 97 %   Anthropometric Measurements Height: 5\' 4"  (1.626 m) Weight: 242 lb (109.8 kg) BMI (Calculated): 41.52 Weight at Last Visit: 242 lb Weight Lost Since Last Visit: 0 Weight Gained Since Last Visit: 0 Starting Weight: 236 lb Total Weight Loss (lbs): 0 lb (0 kg)   Body Composition  Body Fat %: 47.1 % Fat Mass (lbs): 114.2 lbs Muscle Mass (lbs): 122 lbs Total Body Water (lbs): 88.2 lbs Visceral Fat Rating : 12   Other Clinical Data Today's Visit #: 35 Starting Date: 06/26/20     ASSESSMENT AND PLAN:  Diet: Catherine Christian is currently in the action stage of change. As such, her goal is to continue with weight loss efforts. She has agreed to keeping a food journal and adhering to recommended goals of 1900-2100 calories and 130 or more grams protein.  Exercise: Catherine Christian has been instructed to work up to a goal of 150 minutes of combined cardio and strengthening exercise per week for weight loss and overall health benefits.   Behavior Modification:  We discussed the following Behavioral Modification Strategies today: increasing lean protein intake, increasing vegetables, decreasing eating out, keeping healthy foods in the home, and holiday eating strategies.  No follow-ups on file.[name] She was informed of the importance of frequent follow up visits to maximize her success with intensive lifestyle modifications for her multiple health conditions.  Attestation Statements:   Reviewed by clinician on day of visit: allergies, medications, problem list, medical history, surgical history, family history, social history, and previous encounter notes.   Reuben Likes, MD

## 2023-07-28 NOTE — Assessment & Plan Note (Signed)
Patient experiencing more significant foot pain particularly in the last few weeks. She is going to reach out to Dr. Logan Bores for further management of foot pain.

## 2023-07-28 NOTE — Assessment & Plan Note (Addendum)
Patient voices that she is feeling more nauseous in the evenings and isn't sure if it related to metformin.  Does not think that is related to food macro composition.  Her last A1c was 6.2 which is significantly higher than it was previously.  History of gestational diabetes x2.  Patient to change metformin to nightly and see if that helps with nausea.  Will repeat labs in January.  Got a 90 day prescription in early October so does not need refill today.

## 2023-08-01 ENCOUNTER — Other Ambulatory Visit: Payer: Self-pay

## 2023-08-01 ENCOUNTER — Ambulatory Visit
Admission: EM | Admit: 2023-08-01 | Discharge: 2023-08-01 | Disposition: A | Discharge status: Home / Self Care | Payer: Managed Care, Other (non HMO) | Attending: Internal Medicine | Admitting: Internal Medicine

## 2023-08-01 DIAGNOSIS — L02211 Cutaneous abscess of abdominal wall: Secondary | ICD-10-CM

## 2023-08-01 MED ORDER — CEPHALEXIN 500 MG PO CAPS: 500.0000 mg | ORAL_CAPSULE | Freq: Four times a day (QID) | ORAL | 0 refills | Status: DC

## 2023-08-01 NOTE — Discharge Instructions (Signed)
Please change dressing daily and monitor closely for any increased redness, swelling, pus, increase in size.  If this occurs, please be seen as soon as possible.  I have prescribed you an antibiotic to treat infection.

## 2023-08-01 NOTE — ED Triage Notes (Signed)
Pt states abscess to her abdomen for the past 2 days states she popped it a few days ago. States it is draining with an odor.

## 2023-08-01 NOTE — ED Provider Notes (Signed)
[facility]    CSN: [id number] Arrival date & time: 08/01/23  1910      History   Chief Complaint Chief Complaint  Patient presents with   Abscess    HPI Catherine Christian is a 32 y.o. female.   Patient presents with 2 different abscesses to her abdomen.  Reports that she noticed one a few weeks ago which improved, then worsened over the past few days.  Reports that she squeezed it with some purulent drainage.  Another 1 developed to the left of it a few days ago.  Reports that she has noticed a purulent drainage from that area as well.  Denies fever.  Reports that she has had similar abscess in her armpit previously but never in her abdominal area.   Abscess   Past Medical History:  Diagnosis Date   Adnexal cyst    Asthma    Childhood, exercise induced   Asthma    Back pain    Bilateral swelling of feet    Constipation    Gall bladder disease    Gestational diabetes    Gestational diabetes mellitus (GDM) requiring insulin 05/28/2022   Headache    Hepatic steatosis    Migraines    Multiple food allergies    Prediabetes    Preeclampsia    Seasonal allergies     Patient Active Problem List   Diagnosis Date Noted   Prediabetes 12/23/2022   Left foot pain 12/23/2022   Gestational diabetes mellitus (GDM) requiring insulin 05/28/2022   Gestational hypertension 04/03/2020   Abnormal glucose tolerance test (GTT) during pregnancy, antepartum 02/02/2020   Paresthesia 12/15/2014    Past Surgical History:  Procedure Laterality Date   APPENDECTOMY     CESAREAN SECTION     CESAREAN SECTION N/A 04/04/2020   Procedure: CESAREAN SECTION;  Surgeon: Myna Hidalgo, DO;  Location: [facility];  Service: Obstetrics;  Laterality: N/A;   CESAREAN SECTION N/A 05/28/2022   Procedure: CESAREAN SECTION;  Surgeon: Steva Ready, DO;  Location: [facility];  Service: Obstetrics;  Laterality: N/A;   CHOLECYSTECTOMY  02/2016   CYST EXCISION  age 66   neck    OB  History     Gravida  2   Para  2   Term  2   Preterm  0   AB  0   Living  2      SAB  0   IAB  0   Ectopic  0   Multiple  0   Live Births  2            Home Medications    Prior to Admission medications   Medication Sig Start Date End Date Taking? Authorizing Provider  cephALEXin (KEFLEX) 500 MG capsule Take 1 capsule (500 mg total) by mouth 4 (four) times daily. 08/01/23  Yes Yomira Flitton, Acie Fredrickson, FNP  calcium carbonate (TUMS - DOSED IN MG ELEMENTAL CALCIUM) 500 MG chewable tablet Chew 1-2 tablets by mouth 3 (three) times daily as needed for heartburn or indigestion.    [provider]  cetirizine (ZYRTEC) 10 MG tablet Take 10 mg by mouth every evening.    [provider]  Cholecalciferol (VITAMIN D-3) 125 MCG (5000 UT) TABS Take 1 tablet by mouth daily. Patient taking differently: Take 1 tablet by mouth every evening. 02/01/21   Langston Reusing, MD  Docosahexaenoic Acid (PRENATAL DHA) 200 MG CAPS Take 1 capsule (200 mg total) by mouth daily. Patient taking  differently: Take 1 capsule by mouth every evening. 01/02/22   Langston Reusing, MD  fluticasone (FLONASE) 50 MCG/ACT nasal spray Place 1 spray into both nostrils in the morning.    [provider]  Ketotifen Fumarate (RA ANTIHISTAMINE EYE DROPS OP) Place 1-2 drops into both eyes 3 (three) times daily as needed (allergy eyes.).    [provider]  LIDOCAINE-MENTHOL ROLL-ON EX Apply 1 application topically as needed (stiff neck).    [provider]  meloxicam (MOBIC) 15 MG tablet TAKE 1 TABLET (15 MG TOTAL) BY MOUTH DAILY. 06/13/23   Standiford, Jenelle Mages, DPM  metFORMIN (GLUCOPHAGE) 500 MG tablet Take 1 tablet (500 mg total) by mouth daily with breakfast. 06/17/23   Langston Reusing, MD    Family History Family History  Problem Relation Age of Onset   Healthy Mother    Hypertension Mother    Depression Mother    Anxiety disorder Mother    Obesity Mother     Healthy Father    Diabetes Paternal Grandmother    Hypertension Paternal Grandmother     Social History Social History   Tobacco Use   Smoking status: Never   Smokeless tobacco: Never  Vaping Use   Vaping status: Never Used  Substance Use Topics   Alcohol use: Not Currently    Alcohol/week: 0.0 standard drinks of alcohol    Comment: once a week   Drug use: No     Allergies   Cabbage and Drospirenone-ethinyl estradiol   Review of Systems Review of Systems Per HPI  Physical Exam Triage Vital Signs ED Triage Vitals  Encounter Vitals Group     BP 08/01/23 1916 132/82     Systolic BP Percentile --      Diastolic BP Percentile --      Pulse Rate 08/01/23 1916 96     Resp 08/01/23 1916 16     Temp 08/01/23 1916 98.7 F (37.1 C)     Temp Source 08/01/23 1916 Oral     SpO2 08/01/23 1916 99 %     Weight --      Height --      Head Circumference --      Peak Flow --      Pain Score 08/01/23 1918 5     Pain Loc --      Pain Education --      Exclude from Growth Chart --    No data found.  Updated Vital Signs BP 132/82 (BP Location: Left Arm)   Pulse 96   Temp 98.7 F (37.1 C) (Oral)   Resp 16   LMP 07/19/2023 (Exact Date)   SpO2 99%   Breastfeeding Yes   Visual Acuity Right Eye Distance:   Left Eye Distance:   Bilateral Distance:    Right Eye Near:   Left Eye Near:    Bilateral Near:     Physical Exam Constitutional:      General: She is not in acute distress.    Appearance: Normal appearance. She is not toxic-appearing or diaphoretic.  HENT:     Head: Normocephalic and atraumatic.  Eyes:     Extraocular Movements: Extraocular movements intact.     Conjunctiva/sclera: Conjunctivae normal.  Pulmonary:     Effort: Pulmonary effort is normal.  Skin:    Comments: Patient has small pinpoint area notable for drained abscess with surrounding erythema and no swelling present to left lower abdomen.  No purulent drainage.  Small area that is  open notable  for area where drainage occurred.  Slightly larger area that is approximately 1 cm in diameter that is similar in appearance lateral to first abscess.  No purulent drainage from this area.  Neurological:     General: No focal deficit present.     Mental Status: She is alert and oriented to person, place, and time. Mental status is at baseline.  Psychiatric:        Mood and Affect: Mood normal.        Behavior: Behavior normal.        Thought Content: Thought content normal.        Judgment: Judgment normal.      UC Treatments / Results  Labs (all labs ordered are listed, but only abnormal results are displayed) Labs Reviewed - No data to display  EKG   Radiology No results found.  Procedures Procedures (including critical care time)  Medications Ordered in UC Medications - No data to display  Initial Impression / Assessment and Plan / UC Course  I have reviewed the triage vital signs and the nursing notes.  Pertinent labs & imaging results that were available during my care of the patient were reviewed by me and considered in my medical decision making (see chart for details).     Patient has healing abscesses to left lower abdomen.  No I&D indicated at this time.  Will treat with cephalexin as this is safe with breast-feeding.  Dressing applied by clinical staff and patient educated on dressing changes.  Advised to monitor closely and follow-up if symptoms persist or worsen.  Patient verbalized understanding and was agreeable with plan. Final Clinical Impressions(s) / UC Diagnoses   Final diagnoses:  Abscess of skin of abdomen     Discharge Instructions      Please change dressing daily and monitor closely for any increased redness, swelling, pus, increase in size.  If this occurs, please be seen as soon as possible.  I have prescribed you an antibiotic to treat infection.    ED Prescriptions     Medication Sig Dispense Auth. Provider   cephALEXin (KEFLEX) 500  MG capsule Take 1 capsule (500 mg total) by mouth 4 (four) times daily. 28 capsule Gregory, Acie Fredrickson, [place]      PDMP not reviewed this encounter.   Gustavus Bryant, [place] 08/01/23 [phone]

## 2023-08-13 ENCOUNTER — Other Ambulatory Visit: Payer: Self-pay | Admitting: Podiatry

## 2023-09-25 ENCOUNTER — Ambulatory Visit (INDEPENDENT_AMBULATORY_CARE_PROVIDER_SITE_OTHER): Payer: Managed Care, Other (non HMO) | Admitting: Family Medicine

## 2023-10-14 ENCOUNTER — Encounter (INDEPENDENT_AMBULATORY_CARE_PROVIDER_SITE_OTHER): Payer: Self-pay | Admitting: Family Medicine

## 2023-10-14 ENCOUNTER — Ambulatory Visit (INDEPENDENT_AMBULATORY_CARE_PROVIDER_SITE_OTHER): Payer: Commercial Managed Care - HMO | Admitting: Family Medicine

## 2023-10-14 VITALS — BP 111/73 | HR 86 | Temp 98.7°F | Ht 64.0 in | Wt 244.0 lb

## 2023-10-14 DIAGNOSIS — E559 Vitamin D deficiency, unspecified: Secondary | ICD-10-CM | POA: Diagnosis not present

## 2023-10-14 DIAGNOSIS — E669 Obesity, unspecified: Secondary | ICD-10-CM

## 2023-10-14 DIAGNOSIS — Z6841 Body Mass Index (BMI) 40.0 and over, adult: Secondary | ICD-10-CM | POA: Diagnosis not present

## 2023-10-14 DIAGNOSIS — R7303 Prediabetes: Secondary | ICD-10-CM | POA: Diagnosis not present

## 2023-10-14 NOTE — Assessment & Plan Note (Signed)
On OTC Vitamin D 5k international units daily.  Still some fatigue but has two small kids.  Repeat Vitamin D level today.

## 2023-10-14 NOTE — Progress Notes (Signed)
SUBJECTIVE:  Chief Complaint: Obesity  Interim History: Patient had a great holiday season.  She has been more consistent in January with getting more consistent nutrition in except for when the kiddos had RSV. She is feeling more structured.  She has gotten lunch containers and is doing more consistent and adherent to plan. She and her husband and family are traveling at the end of February for a conference with her church.  She realizes she needs to get back to journaling and keeping track of calories and protein.  Catherine Christian is here to discuss her progress with her obesity treatment plan. She is on the keeping a food journal and adhering to recommended goals of 1900-2100 calories and 130+ grams of protein and states she is following her eating plan approximately 50 % of the time. She states she is exercising 60 minutes 2 times per week.   OBJECTIVE: Visit Diagnoses: Problem List Items Addressed This Visit       Other   Prediabetes - Primary   Patient on metformin daily 500mg  daily.  She is still nursing.  Last A1c more elevated than previously at 6.2.  Repeat labs today and will discuss at next appointment.      Relevant Orders   Comprehensive metabolic panel   Hemoglobin A1c   Insulin, random   Vitamin D deficiency   On OTC Vitamin D 5k international units daily.  Still some fatigue but has two small kids.  Repeat Vitamin D level today.        Relevant Orders   VITAMIN D 25 Hydroxy (Vit-D Deficiency, Fractures)    Vitals Temp: 98.7 F (37.1 C) BP: 111/73 Pulse Rate: 86 SpO2: 96 %   Anthropometric Measurements Height: 5\' 4"  (1.626 m) Weight: 244 lb (110.7 kg) BMI (Calculated): 41.86 Weight at Last Visit: 242 lb Weight Lost Since Last Visit: 0 Weight Gained Since Last Visit: 2 Starting Weight: 236 lb Total Weight Loss (lbs): 0 lb (0 kg)   Body Composition  Body Fat %: 47.4 % Fat Mass (lbs): 116 lbs Muscle Mass (lbs): 122.2 lbs Total Body Water (lbs): 90.2  lbs Visceral Fat Rating : 12   Other Clinical Data Fasting: yes Labs: yes Today's Visit #: 36 Starting Date: 06/26/20     ASSESSMENT AND PLAN:  Diet: Catherine Christian is currently in the action stage of change. As such, her goal is to continue with weight loss efforts. She has agreed to keeping a food journal and adhering to recommended goals of 1900-2100 calories and 130 or more grams of protein daily.  Patient to start food log or journaling meal plan.  The initial goal will be to habitually log or journal for at least 4 days a week.  The expectation it that patient may not initially meet calorie or protein goals as the nturitional understanding of food intake is begun.  We discussed the 10:1 ratio when reading a food label.  Patient agrees to keep a food log either electronically or on paper and bring to the next appointment to be able to dissect and discuss it with provider.    Exercise: Catherine Christian has been instructed to continue exercising as is for weight loss and overall health benefits.   Behavior Modification:  We discussed the following Behavioral Modification Strategies today: increasing lean protein intake, increasing vegetables, meal planning and cooking strategies, keeping healthy foods in the home, and planning for success.   No follow-ups on file.[name] She was informed of the importance of frequent follow up  visits to maximize her success with intensive lifestyle modifications for her multiple health conditions.  Attestation Statements:   Reviewed by clinician on day of visit: allergies, medications, problem list, medical history, surgical history, family history, social history, and previous encounter notes.     Reuben Likes, MD

## 2023-10-14 NOTE — Assessment & Plan Note (Signed)
Patient on metformin daily 500mg  daily.  She is still nursing.  Last A1c more elevated than previously at 6.2.  Repeat labs today and will discuss at next appointment.

## 2023-10-15 LAB — HEMOGLOBIN A1C
Est. average glucose Bld gHb Est-mCnc: 123 mg/dL
Hgb A1c MFr Bld: 5.9 % — ABNORMAL HIGH (ref 4.8–5.6)

## 2023-10-15 LAB — COMPREHENSIVE METABOLIC PANEL
ALT: 22 [IU]/L (ref 0–32)
AST: 21 [IU]/L (ref 0–40)
Albumin: 4.5 g/dL (ref 3.9–4.9)
Alkaline Phosphatase: 52 [IU]/L (ref 44–121)
BUN/Creatinine Ratio: 22 (ref 9–23)
BUN: 15 mg/dL (ref 6–20)
Bilirubin Total: 0.3 mg/dL (ref 0.0–1.2)
CO2: 23 mmol/L (ref 20–29)
Calcium: 9.3 mg/dL (ref 8.7–10.2)
Chloride: 102 mmol/L (ref 96–106)
Creatinine, Ser: 0.67 mg/dL (ref 0.57–1.00)
Globulin, Total: 2.3 g/dL (ref 1.5–4.5)
Glucose: 100 mg/dL — ABNORMAL HIGH (ref 70–99)
Potassium: 4.2 mmol/L (ref 3.5–5.2)
Sodium: 140 mmol/L (ref 134–144)
Total Protein: 6.8 g/dL (ref 6.0–8.5)
eGFR: 119 mL/min/{1.73_m2} (ref >59)

## 2023-10-15 LAB — INSULIN, RANDOM: INSULIN: 14.1 u[IU]/mL (ref 2.6–24.9)

## 2023-10-15 LAB — VITAMIN D 25 HYDROXY (VIT D DEFICIENCY, FRACTURES): Vit D, 25-Hydroxy: 63.3 ng/mL (ref 30.0–100.0)

## 2023-11-10 ENCOUNTER — Ambulatory Visit (INDEPENDENT_AMBULATORY_CARE_PROVIDER_SITE_OTHER): Payer: Commercial Managed Care - HMO | Admitting: Physician Assistant

## 2023-11-11 ENCOUNTER — Other Ambulatory Visit: Payer: Self-pay | Admitting: Podiatry

## 2023-11-12 ENCOUNTER — Encounter (INDEPENDENT_AMBULATORY_CARE_PROVIDER_SITE_OTHER): Payer: Self-pay | Admitting: Physician Assistant

## 2023-11-12 ENCOUNTER — Ambulatory Visit (INDEPENDENT_AMBULATORY_CARE_PROVIDER_SITE_OTHER): Payer: Commercial Managed Care - HMO | Admitting: Physician Assistant

## 2023-11-12 VITALS — BP 113/77 | HR 79 | Temp 98.0°F | Ht 64.0 in | Wt 246.0 lb

## 2023-11-12 DIAGNOSIS — N921 Excessive and frequent menstruation with irregular cycle: Secondary | ICD-10-CM | POA: Diagnosis not present

## 2023-11-12 DIAGNOSIS — R5383 Other fatigue: Secondary | ICD-10-CM | POA: Diagnosis not present

## 2023-11-12 DIAGNOSIS — E669 Obesity, unspecified: Secondary | ICD-10-CM

## 2023-11-12 DIAGNOSIS — E559 Vitamin D deficiency, unspecified: Secondary | ICD-10-CM | POA: Diagnosis not present

## 2023-11-12 DIAGNOSIS — Z6841 Body Mass Index (BMI) 40.0 and over, adult: Secondary | ICD-10-CM

## 2023-11-12 DIAGNOSIS — R7303 Prediabetes: Secondary | ICD-10-CM | POA: Diagnosis not present

## 2023-11-12 NOTE — Progress Notes (Signed)
 SUBJECTIVE: Discussed the use of AI scribe software for clinical note transcription with the patient, who gave verbal consent to proceed.  Chief Complaint: Obesity  Interim History: She is up 2 lbs since last visit.   Catherine Christian is here to discuss her progress with her obesity treatment plan. She is on the keeping a food journal and adhering to recommended goals of 1900-2000 calories and 130 grams of  protein and states she is following her eating plan approximately 70 % of the time. She states she is not exercising 0 minutes 0 times per week. Catherine Christian is a 33 year old female with obesity who presents for a follow-up on her obesity treatment plan.  She has a history of prediabetes and vitamin D deficiency and is currently taking metformin 500 mg daily. She experiences significant fatigue and has not engaged in physical exercise for two weeks, prioritizing sleep. Her sleep quality has improved since ceasing nighttime nursing of her 32-month-old daughter.  She recently had a very heavy menstrual period, starting on a Saturday and being heaviest on Sunday and Monday, with bleeding through an ultra tampon every two hours. On Monday night, she passed three large clots, after which the bleeding subsided. Since then, she has experienced right-sided pain. An ultrasound is scheduled for March 5th to investigate potential ovarian issues, such as a cyst.  She is a stay-at-home mom and manages her diet by maintaining a routine for breakfast and lunch and being mindful of her portions at dinner. She sometimes feels very full and not hungry, describing it as similar to the sensation of being pregnant. She uses protein bars to help meet her protein intake goals. We discussed dividing up the protein at dinner (ie  2 oz. protein based snack either mid morning or afternoon) so that she can decrease dinner protein to 6-8 oz.   She is taking cholecalciferol 5000 units daily, prenatal vitamins, Zyrtec  daily, and meloxicam as needed for foot pain.  OBJECTIVE: Visit Diagnoses: Problem List Items Addressed This Visit     Prediabetes - Primary   Vitamin D deficiency   Other Visit Diagnoses       Menorrhagia         Fatigue, unspecified type         Obesity with starting BMI of 40.5         BMI 40.0-44.9, adult (HCC) Current BMI 42.4         Obesity Reports difficulty maintaining energy levels and adhering to a high-protein diet. Stress and hormonal changes may be contributing to weight fluctuations. Current strategies include mindful eating and portion control. Discussed importance of protein intake and potential dietary adjustments. - Continue current diet plan with mindful eating and portion control - Consider adding protein-based snacks to take some of protein off the dinner time meal to avoid feeling overly full and potentially under eating/not meeting protein goal.  - Monitor weight and muscle mass - Follow up in 4 weeks  Prediabetes On metformin 500 mg daily. Recent labs show A1c improved to 5.9%. Glucose levels slightly elevated but improved. Discussed adherence to low simple carbohydrate diet and impact of stress and hormonal changes on glucose levels. Lab Results  Component Value Date   HGBA1C 5.9 (H) 10/14/2023   HGBA1C 6.2 (H) 04/08/2023   HGBA1C 5.7 (H) 09/23/2022   Lab Results  Component Value Date   LDLCALC 81 06/26/2020   CREATININE 0.67 10/14/2023   INSULIN  Date Value Ref Range Status  10/14/2023 14.1 2.6 - 24.9 uIU/mL Final   - Continue metformin 500 mg daily. No refill needed this visit.  - Monitor glucose and A1c levels Continue working on nutrition plan to decrease simple carbohydrates, increase lean proteins and exercise to promote weight loss, improve glycemic control and prevent progression to Type 2 diabetes.    Menorrhagia with fatigue Reports heavy menstrual bleeding with large clots and right-sided pain. Ultrasound scheduled/OB GYN visit for  March 5th to evaluate for ovarian cyst other. Further evaluation needed including CBC, B12, thyroid function, and female hormone levels. - Proceed with ultrasound on March 5th - Request CBC, B12, thyroid function, and female hormone levels if felt indicated.  - Follow up with GYN for further evaluation and management   Vitamin D Deficiency Vitamin D levels improved with cholecalciferol 5000 units daily. Reports fatigue, potentially multifactorial including vitamin D deficiency, hormonal changes, and stress. - Adjust cholecalciferol to 5000 units Monday through Friday Low vitamin D levels can be associated with adiposity and may result in leptin resistance and weight gain. Also associated with fatigue.  Currently on vitamin D supplementation without any adverse effects such as nausea, vomiting or muscle weakness.    General Health Maintenance Reports improved sleep since reducing nighttime nursing. Discussed importance of overall health and stress management. Encouraged regular physical activity as tolerated. - Continue prenatal vitamins - Continue Zyrtec daily - Use meloxicam as needed for foot pain - Encourage regular physical activity - Follow up in 4 weeks  Follow-up - Schedule follow-up appointment in 4 weeks on March 27th at 10 AM.  Vitals Temp: 98 F (36.7 C) BP: 113/77 Pulse Rate: 79 SpO2: 97 %   Anthropometric Measurements Height: 5\' 4"  (1.626 m) Weight: 246 lb (111.6 kg) BMI (Calculated): 42.21 Weight at Last Visit: 244 lb Weight Lost Since Last Visit: 0 Weight Gained Since Last Visit: 2 lb Starting Weight: 236 lb Total Weight Loss (lbs): 0 lb (0 kg)   Body Composition  Body Fat %: 47.6 % Fat Mass (lbs): 117.4 lbs Muscle Mass (lbs): 122.8 lbs Total Body Water (lbs): 91.6 lbs Visceral Fat Rating : 13   Other Clinical Data Fasting: no Labs: no Today's Visit #: 37 Starting Date: 06/26/20     ASSESSMENT AND PLAN:  Diet: Catherine Christian is currently in the  action stage of change. As such, her goal is to continue with weight loss efforts and has agreed to keeping a food journal and adhering to recommended goals of 1900-2000 calories and 130 grams of protein.   Exercise:  All adults should avoid inactivity. Some activity is better than none, and adults who participate in any amount of physical activity, gain some health benefits. and For substantial health benefits, adults should do at least 150 minutes (2 hours and 30 minutes) a week of moderate-intensity, or 75 minutes (1 hour and 15 minutes) a week of vigorous-intensity aerobic physical activity, or an equivalent combination of moderate- and vigorous-intensity aerobic activity. Aerobic activity should be performed in episodes of at least 10 minutes, and preferably, it should be spread throughout the week.  Behavior Modification:  We discussed the following Behavioral Modification Strategies today: increasing lean protein intake, decreasing simple carbohydrates, increasing vegetables, increase H2O intake, increase high fiber foods, no skipping meals, avoiding temptations, planning for success, and keep a strict food journal. We discussed various medication options to help Catherine Christian with her weight loss efforts and we both agreed to continue the current plan for medical weight loss.  Return in about  4 weeks (around 12/10/2023).[name] She was informed of the importance of frequent follow up visits to maximize her success with intensive lifestyle modifications for her multiple health conditions.  Attestation Statements:   Reviewed by clinician on day of visit: allergies, medications, problem list, medical history, surgical history, family history, social history, and previous encounter notes.   Time spent on visit including pre-visit chart review and post-visit care and charting was 44 minutes  Karalee Hauter,PA-C

## 2023-12-02 ENCOUNTER — Other Ambulatory Visit (INDEPENDENT_AMBULATORY_CARE_PROVIDER_SITE_OTHER): Payer: Self-pay | Admitting: Family Medicine

## 2023-12-02 DIAGNOSIS — R7303 Prediabetes: Secondary | ICD-10-CM

## 2023-12-09 NOTE — Progress Notes (Unsigned)
 SUBJECTIVE: Discussed the use of AI scribe software for clinical note transcription with the patient, who gave verbal consent to proceed.  Chief Complaint: Obesity  Interim History: She is up 1 lb since last visit.   Catherine Christian is here to discuss her progress with her obesity treatment plan. She is on the Category 4 Plan and states she is following her eating plan approximately 50 % of the time. She states she is not exercising.  Catherine Christian is a 33 year old female who presents for follow-up of her obesity treatment plan.  She has a history of prediabetes and vitamin D deficiency. She has been on metformin for less than a year, initially prescribed when her A1c was 6.1. She is almost out of metformin and has previously been on higher doses during her pregnancies due to gestational diabetes. During her pregnancies, she experienced issues with fasting glucose levels, which led to an increase in metformin dosage and eventually insulin use.  She is trying to follow a meal plan, although not perfectly, and is concerned about progressing to diabetes.  She reports menstrual irregularities, having been on her period for two weeks with clotting. She was placed on progesterone for three months, which she has already started, but it has led to another period after a ten-day break. An ultrasound showed a normal uterus and ovaries but a thicker endometrial lining. Her menstrual cycle is relatively regular, and PCOS is being ruled out. She has another ultrasound scheduled for June. Her testosterone levels were found to be low, but other lab results, including CBC and FSH, were normal.  She experiences nausea frequently and had a severe migraine last Friday, resulting in vomiting and a temporary weight loss of three pounds. She feels nauseous most of the time and has noticed a decrease in cravings, stating, 'I haven't been wanting to eat a whole lot.' She enjoys breakfast but often skips lunch or eats  minimally. She cannot finish her dinner and only feels slightly hungry by snack time.  OBJECTIVE: Visit Diagnoses: Problem List Items Addressed This Visit     Prediabetes - Primary   Relevant Medications   metFORMIN (GLUCOPHAGE) 500 MG tablet   Other Visit Diagnoses       Menorrhagia         Obesity with starting BMI of 40.5       Relevant Medications   metFORMIN (GLUCOPHAGE) 500 MG tablet     BMI 40.0-44.9, adult (HCC) Current BMI 42.5       Relevant Medications   metFORMIN (GLUCOPHAGE) 500 MG tablet     Obesity Catherine Christian is undergoing treatment for obesity and is currently on metformin for prediabetes management. Increasing metformin may aid in managing insulin resistance, contributing to her weight issues. Injectable medications like Wegovy or Zepbound were discussed as future options, particularly beneficial for PCOS and cravings, but are currently cost-prohibitive. The focus remains on metformin. - Increase metformin to 500 mg twice daily with at least four hours between doses - Consider future use of injectable medications like Wegovy or Zepbound if needed - Encourage adherence to meal plan and regular exercise  Prediabetes with insulin resistance Catherine Christian has prediabetes with an A1c of 5.9 %. She is on metformin to manage blood glucose levels and prevent diabetes progression. Increasing metformin may further manage insulin resistance and prevent progression. Lab Results  Component Value Date   HGBA1C 5.9 (H) 10/14/2023   HGBA1C 6.2 (H) 04/08/2023   HGBA1C 5.7 (H) 09/23/2022   Lab  Results  Component Value Date   LDLCALC 81 06/26/2020   CREATININE 0.67 10/14/2023   INSULIN  Date Value Ref Range Status  10/14/2023 14.1 2.6 - 24.9 uIU/mL Final  ]Continue working on nutrition plan to decrease simple carbohydrates, increase lean proteins and exercise to promote weight loss, improve glycemic control and prevent progression to Type 2 diabetes.  - Increase metformin to 500 mg  twice daily with at least four hours between doses - Monitor blood glucose levels and A1c and electrolytes  Menstrual Irregularities/? PCOS Catherine Christian experiences prolonged menstrual bleeding and clotting.  She was seen by her GYN at [facility]. Labs all okay except for slightly low testosterone level.  Ultrasound showed a normal uterus and ovaries with a thicker endometrial lining, suggesting possible adenomyosis, potentially due to previous C-sections. A definitive diagnosis requires a hysterectomy, which is not planned. PCOS is considered a differential diagnosis due to symptoms and insulin resistance. She started progesterone x 10 days following her last cycle x 3 months. - Follow up with another ultrasound in June Will continue to follow up with GYN.     Follow-up Catherine Christian is scheduled for a follow-up to assess the effectiveness of the increased metformin dosage and discuss further management options. - Follow-up appointment on April 22 at 3 PM  Vitals Temp: 98.5 F (36.9 C) BP: 130/80 Pulse Rate: (!) 118 SpO2: 95 %   Anthropometric Measurements Height: 5\' 4"  (1.626 m) Weight: 247 lb (112 kg) BMI (Calculated): 42.38 Weight at Last Visit: 246 lb Weight Lost Since Last Visit: 0 Weight Gained Since Last Visit: 1 lb Starting Weight: 236 lb Total Weight Loss (lbs): 0 lb (0 kg) Peak Weight: 270 lb   Body Composition  Body Fat %: 47.7 % Fat Mass (lbs): 118.2 lbs Muscle Mass (lbs): 122.8 lbs Total Body Water (lbs): 88.4 lbs Visceral Fat Rating : 13   Other Clinical Data Fasting: no Labs: no Today's Visit #: 38 Starting Date: 06/26/20     ASSESSMENT AND PLAN:  Diet: Catherine Christian is currently in the action stage of change. As such, her goal is to continue with weight loss efforts. She has agreed to Category 4 Plan.  Exercise: Analaura has been instructed to work up to a goal of 150 minutes of combined cardio and strengthening exercise per week for weight loss and overall  health benefits.   Behavior Modification:  We discussed the following Behavioral Modification Strategies today: increasing lean protein intake, decreasing simple carbohydrates, increasing vegetables, increase H2O intake, increase high fiber foods, meal planning and cooking strategies, avoiding temptations, and planning for success. We discussed various medication options to help Uzma with her weight loss efforts and we both agreed to increase metformin for primary indication of prediabetes with insulin resistance and continue to work on nutritional and behavioral strategies to promote weight loss.  .  Return in about 4 weeks (around 01/08/2024).[name] She was informed of the importance of frequent follow up visits to maximize her success with intensive lifestyle modifications for her multiple health conditions.  Attestation Statements:   Reviewed by clinician on day of visit: allergies, medications, problem list, medical history, surgical history, family history, social history, and previous encounter notes.   Time spent on visit including pre-visit chart review and post-visit care and charting was 28 minutes.    Kru Allman, PA-C

## 2023-12-11 ENCOUNTER — Ambulatory Visit (INDEPENDENT_AMBULATORY_CARE_PROVIDER_SITE_OTHER): Payer: Commercial Managed Care - HMO | Admitting: Physician Assistant

## 2023-12-11 ENCOUNTER — Encounter (INDEPENDENT_AMBULATORY_CARE_PROVIDER_SITE_OTHER): Payer: Self-pay | Admitting: Physician Assistant

## 2023-12-11 VITALS — BP 130/80 | HR 118 | Temp 98.5°F | Ht 64.0 in | Wt 247.0 lb

## 2023-12-11 DIAGNOSIS — E669 Obesity, unspecified: Secondary | ICD-10-CM | POA: Diagnosis not present

## 2023-12-11 DIAGNOSIS — R7303 Prediabetes: Secondary | ICD-10-CM

## 2023-12-11 DIAGNOSIS — Z6841 Body Mass Index (BMI) 40.0 and over, adult: Secondary | ICD-10-CM | POA: Diagnosis not present

## 2023-12-11 DIAGNOSIS — E559 Vitamin D deficiency, unspecified: Secondary | ICD-10-CM

## 2023-12-11 DIAGNOSIS — N921 Excessive and frequent menstruation with irregular cycle: Secondary | ICD-10-CM | POA: Diagnosis not present

## 2023-12-11 MED ORDER — METFORMIN HCL 500 MG PO TABS: 500.0000 mg | ORAL_TABLET | Freq: Two times a day (BID) | ORAL | 0 refills | Status: DC

## 2024-01-03 ENCOUNTER — Other Ambulatory Visit (INDEPENDENT_AMBULATORY_CARE_PROVIDER_SITE_OTHER): Payer: Self-pay | Admitting: Physician Assistant

## 2024-01-03 DIAGNOSIS — R7303 Prediabetes: Secondary | ICD-10-CM

## 2024-01-05 ENCOUNTER — Other Ambulatory Visit (INDEPENDENT_AMBULATORY_CARE_PROVIDER_SITE_OTHER): Payer: Self-pay | Admitting: Physician Assistant

## 2024-01-05 DIAGNOSIS — R7303 Prediabetes: Secondary | ICD-10-CM

## 2024-01-06 ENCOUNTER — Encounter (INDEPENDENT_AMBULATORY_CARE_PROVIDER_SITE_OTHER): Payer: Self-pay | Admitting: Physician Assistant

## 2024-01-06 ENCOUNTER — Ambulatory Visit (INDEPENDENT_AMBULATORY_CARE_PROVIDER_SITE_OTHER): Payer: Self-pay | Admitting: Physician Assistant

## 2024-01-06 VITALS — BP 103/72 | HR 109 | Temp 99.1°F | Ht 64.0 in | Wt 248.0 lb

## 2024-01-06 DIAGNOSIS — R7303 Prediabetes: Secondary | ICD-10-CM

## 2024-01-06 DIAGNOSIS — N921 Excessive and frequent menstruation with irregular cycle: Secondary | ICD-10-CM

## 2024-01-06 DIAGNOSIS — E669 Obesity, unspecified: Secondary | ICD-10-CM | POA: Diagnosis not present

## 2024-01-06 DIAGNOSIS — E559 Vitamin D deficiency, unspecified: Secondary | ICD-10-CM

## 2024-01-06 DIAGNOSIS — Z6841 Body Mass Index (BMI) 40.0 and over, adult: Secondary | ICD-10-CM

## 2024-01-06 MED ORDER — METFORMIN HCL 500 MG PO TABS: 500.0000 mg | ORAL_TABLET | Freq: Two times a day (BID) | ORAL | 0 refills | Status: DC

## 2024-01-06 NOTE — Progress Notes (Signed)
 SUBJECTIVE: Discussed the use of AI scribe software for clinical note transcription with the patient, who gave verbal consent to proceed.  Chief Complaint: Obesity  Interim History: She is up 1 lb since her last visit.   Muscle mass + 6.4 lbs Adipose mass - 6.0 lbs Total body water  + 2.2 lbs  Catherine Christian is here to discuss her progress with her obesity treatment plan. She is on the Category 4 Plan and states she is following her eating plan approximately 25 % of the time. She states she is not exercising. Catherine Christian is a 33 year old female with obesity and prediabetes who presents for follow-up of her obesity treatment plan.  She has been following an obesity treatment plan and notes an increase in muscle mass and a decrease in adipose tissue, despite not strictly adhering to her dietary plan. She attributes some of the positive changes to the recent increase in her metformin  dosage. No significant changes in weight have been observed this month, and she feels 'burnt out' on her current meal plan.  She has a history of prediabetes with insulin  resistance and is currently on metformin , which was recently increased. She has no gastrointestinal symptoms with the increased dosage, although she experiences some gas, which is not uncomfortable. She continues to take vitamin D  supplements, 5000 IU, Monday through Friday.  She is experiencing menstrual irregularities and is being evaluated for polycystic ovary syndrome (PCOS). She is on her second month of Provera, currently on day seven, and plans to have one more round before undergoing an ultrasound.  In terms of physical activity, she has not been exercising regularly but has been more active by helping a friend with household tasks. She plans to start a workout routine focusing on strength training two to three days a week.  Her dietary habits include sticking to a basic breakfast routine and attempting to make healthier choices when  eating out. She has explored using online resources for meal planning and is considering a high-protein diet plan that fits within her calorie budget of 1500 to 1700 calories per day.  OBJECTIVE: Visit Diagnoses: Problem List Items Addressed This Visit     Prediabetes - Primary   Relevant Medications   metFORMIN  (GLUCOPHAGE ) 500 MG tablet   Vitamin D  deficiency   Other Visit Diagnoses       Menorrhagia         Obesity with starting BMI of 40.5       Relevant Medications   metFORMIN  (GLUCOPHAGE ) 500 MG tablet     Obesity Obesity management is ongoing with a positive shift in body composition, including increased muscle mass and decreased adipose tissue. She expressed fatigue with the current dietary plan and seeks more flexibility.  A high-protein diet plan was discussed, and she is encouraged to maintain a calorie intake between 1500 to 1700 calories per day and 120+ grams of protein daily. She plans to start exercising, focusing on strength training, with a gradual increase in activity to avoid excessive soreness and inflammation. The potential benefits of incorporating healthy fats like avocados and using resources like Skinny Taste and Kevin's meals were discussed to aid in dietary planning. - Encourage continuation of current dietary plan with flexibility for healthy fats like avocados. - Recommend calorie intake of 1500 to 1700 calories per day and 120 grams or more of protein daily.  - Advise starting exercise with strength training two days a week, gradually increasing to three days. - Discuss potential for  HIIT exercises in future visits.  Prediabetes with insulin  resistance Prediabetes with insulin  resistance is managed with metformin . She has not experienced gastrointestinal side effects from the increased dose. The increase in metformin  may be contributing to positive changes in body composition. Monitoring her response to the current dose and considering further adjustments if  progress continues is planned. - Continue metformin  500 mg twice daily. - Evaluate labs in June or July to assess progress. Meds ordered this encounter  Medications   metFORMIN  (GLUCOPHAGE ) 500 MG tablet    Sig: Take 1 tablet (500 mg total) by mouth 2 (two) times daily with a meal.    Dispense:  90 tablet    Refill:  0    Menstrual irregularities under evaluation for PCOS She is experiencing menstrual irregularities and is on the second round of Provera. An ultrasound is planned after the next round of Provera to further evaluate for PCOS. - Continue Provera as prescribed. - Plan for ultrasound after the next round of Provera.  Vitamin D  deficiency Vitamin D  deficiency is managed with supplementation. She is taking vitamin D  5000 IU Monday through Friday. - Continue vitamin D  5000 IU Monday through Friday.  Vitals Temp: 99.1 F (37.3 C) BP: 103/72 Pulse Rate: (!) 109 SpO2: 94 %   Anthropometric Measurements Height: 5\' 4"  (1.626 m) Weight: 248 lb (112.5 kg) BMI (Calculated): 42.55 Weight at Last Visit: 247 lb Weight Lost Since Last Visit: 0 Weight Gained Since Last Visit: 1 lb Starting Weight: 236 lb Total Weight Loss (lbs): 0 lb (0 kg) Peak Weight: 270 lb   Body Composition  Body Fat %: 45.2 % Fat Mass (lbs): 112.2 lbs Muscle Mass (lbs): 129.2 lbs Total Body Water  (lbs): 90.6 lbs Visceral Fat Rating : 12   Other Clinical Data Fasting: no Labs: no Today's Visit #: 39 Starting Date: 06/26/20     ASSESSMENT AND PLAN:  Diet: [name] is currently in the action stage of change. As such, her goal is to continue with weight loss efforts. She has agreed to Category 4 Plan and keeping a food journal and adhering to recommended goals of 1500-1700 calories and 120+ grams of  protein.  Exercise: [name] has been instructed to work up to a goal of 150 minutes of combined cardio and strengthening exercise per week and that some exercise is better than none for weight  loss and overall health benefits.   Behavior Modification:  We discussed the following Behavioral Modification Strategies today: increasing lean protein intake, decreasing simple carbohydrates, increasing vegetables, increase H2O intake, increase high fiber foods, meal planning and cooking strategies, avoiding temptations, planning for success, and keep a strict food journal. Or using ChatGPT for meal plans We discussed various medication options to help Sabrina with her weight loss efforts and we both agreed to continue to work on nutritional and behavioral strategies to promote weight loss.  .  Return in about 6 weeks (around 02/17/2024).Aaron Aas She was informed of the importance of frequent follow up visits to maximize her success with intensive lifestyle modifications for her multiple health conditions.  Attestation Statements:   Reviewed by clinician on day of visit: allergies, medications, problem list, medical history, surgical history, family history, social history, and previous encounter notes.   Time spent on visit including pre-visit chart review and post-visit care and charting was 33 minutes.    Halina Asano, PA-C

## 2024-02-12 DIAGNOSIS — R509 Fever, unspecified: Secondary | ICD-10-CM | POA: Diagnosis not present

## 2024-02-12 DIAGNOSIS — J329 Chronic sinusitis, unspecified: Secondary | ICD-10-CM | POA: Diagnosis not present

## 2024-02-17 ENCOUNTER — Ambulatory Visit (INDEPENDENT_AMBULATORY_CARE_PROVIDER_SITE_OTHER): Admitting: Physician Assistant

## 2024-02-17 ENCOUNTER — Encounter (INDEPENDENT_AMBULATORY_CARE_PROVIDER_SITE_OTHER): Payer: Self-pay | Admitting: Physician Assistant

## 2024-02-17 VITALS — BP 111/76 | HR 114 | Temp 99.1°F | Ht 64.0 in | Wt 245.0 lb

## 2024-02-17 DIAGNOSIS — Z6841 Body Mass Index (BMI) 40.0 and over, adult: Secondary | ICD-10-CM | POA: Diagnosis not present

## 2024-02-17 DIAGNOSIS — E669 Obesity, unspecified: Secondary | ICD-10-CM

## 2024-02-17 DIAGNOSIS — R7303 Prediabetes: Secondary | ICD-10-CM | POA: Diagnosis not present

## 2024-02-17 DIAGNOSIS — E559 Vitamin D deficiency, unspecified: Secondary | ICD-10-CM

## 2024-02-17 NOTE — Progress Notes (Signed)
 SUBJECTIVE: Discussed the use of AI scribe software for clinical note transcription with the patient, who gave verbal consent to proceed.  Chief Complaint: Obesity  Interim History: She is down 3 lbs since last visit.    Catherine Christian is here to discuss her progress with her obesity treatment plan. She is on the Category 4 Plan and keeping a food journal and adhering to recommended goals of 1500-1700 calories and 120+ grams of protein and states she is following her eating plan approximately 50 % of the time. She states she is not exercising 0 minutes 0 times per week.  Catherine Christian is a 33 year old female with obesity and prediabetes who presents for follow-up of her obesity treatment plan. She is accompanied by her children.  She is following a calorie-controlled diet of 1500 to 1700 calories per day with a protein intake of 120 grams or more, but adheres to this plan about 50% of the time. She has lost three pounds since her last visit, although this was partly due to a recent gastrointestinal illness following by a sinus infection which keep her quite ill for several days. She is not consistently exercising but is very busy caring for her 2 young children and husband.   She is currently taking metformin  500 mg twice daily with meals for her prediabetes. During a recent gastrointestinal illness, she reduced her metformin  intake to one pill a day when she felt well enough. She also takes over-the-counter vitamin D  supplementation, 5000 units, Monday through Friday.  Two weeks ago, she experienced a stomach bug followed by a severe sinus infection that caused a fever for a couple of days. During this time, she was unable to maintain her usual diet and exercise routine.  She has previously lost weight using [employer]itness[employer] and a structured meal plan, but finds it challenging to maintain discipline with her current responsibilities, including caring for her children. She mentions difficulty with  meal planning and preparation due to time constraints and the high cost of food.  She has family support nearby, including her husband's mother, who occasionally helps with childcare. However, she needs more time to focus on meal preparation and self-care.  Fasting IC and labs at next OV. Pt. Advised to arrive 30 minutes prior to visit time.     OBJECTIVE: Visit Diagnoses: Problem List Items Addressed This Visit     Prediabetes - Primary   Vitamin D  deficiency   Other Visit Diagnoses       Obesity with starting BMI of 40.5         BMI 40.0-44.9, adult (HCC) BMI 42.2         Obesity A 33 year old female adheres to a calorie-restricted diet of 1500-1700 calories per day with a protein intake of 120 grams or more, approximately 50% of the time.  She has lost 3 pounds since the last visit, partly due to a recent gastrointestinal illness. She is not consistently exercising.  She previously lost weight using [employer]itness[employer] and a natural meal plan without metformin , which she only used during pregnancy. Currently, she is on metformin  500 mg twice daily with meals.  She faces challenges with meal planning and preparation due to time constraints and food costs. - Encourage consistent journaling of dietary intake using [employer]. - Continue metformin  500 mg twice daily with meals. - Explore cost-effective and time-efficient meal preparation options, such as using a grill or considering online meal prep services. - Reassess metabolic rate and lab work to ensure  the current calorie and protein intake recommendations are appropriate. - Encourage regular physical activity.  Prediabetes She is on metformin  and was able to take one pill a day during her recent illness. The current plan includes a calorie intake of 1500-1700 calories per day and 120-130 grams of protein. There is a need to reassess her metabolic rate and lab work to ensure the current dietary plan is appropriate. She is encouraged  to maintain consistent dietary management and increase regular physical activity, which she is currently not doing consistently. The importance of discipline in maintaining her dietary plan was discussed, especially given her busy schedule with two young children. - Continue metformin  500 mg twice daily with meals. - Encourage consistent dietary management and weight loss. - Encourage regular physical activity. - Reassess metabolic rate and lab work to ensure appropriate calorie and protein intake. -Recheck fasting labs next office visit.   Vitamin D  Deficiency She is on over-the-counter vitamin D  supplementation, taking 5000 units Monday through Friday. - Continue over-the-counter vitamin D  supplementation, 5000 units Monday through Friday.  Vitals Temp: 99.1 F (37.3 C) BP: 111/76 Pulse Rate: (!) 114 SpO2: 96 %   Anthropometric Measurements Height: 5\' 4"  (1.626 m) Weight: 245 lb (111.1 kg) BMI (Calculated): 42.03 Weight at Last Visit: 248 lb Weight Lost Since Last Visit: 3 lb Weight Gained Since Last Visit: 0 Starting Weight: 236 lb Total Weight Loss (lbs): 0 lb (0 kg) Peak Weight: 270 lb   Body Composition  Body Fat %: 47.3 % Fat Mass (lbs): 116.2 lbs Muscle Mass (lbs): 123 lbs Total Body Water  (lbs): 92.4 lbs Visceral Fat Rating : 12   Other Clinical Data Fasting: No Labs: No Today's Visit #: 40 Starting Date: 06/26/20     ASSESSMENT AND PLAN:  Diet: Demetria is currently in the action stage of change. As such, her goal is to continue with weight loss efforts. She has agreed to Category 4 Plan and keeping a food journal and adhering to recommended goals of 1500-1700 calories and 120+ grams of protein.  Exercise: Aiana has been instructed to work up to a goal of 150 minutes of combined cardio and strengthening exercise per week for weight loss and overall health benefits.   Behavior Modification:  We discussed the following Behavioral Modification  Strategies today: increasing lean protein intake, decreasing simple carbohydrates, increasing vegetables, increase H2O intake, increase high fiber foods, meal planning and cooking strategies, avoiding temptations, planning for success, and keep a strict food journal. We discussed various medication options to help Ayza with her weight loss efforts and we both agreed to continue current treatment plan and plan to recheck fasting IC and labs at next OV.  Return in about 4 weeks (around 03/16/2024).Aaron Aas She was informed of the importance of frequent follow up visits to maximize her success with intensive lifestyle modifications for her multiple health conditions.  Attestation Statements:   Reviewed by clinician on day of visit: allergies, medications, problem list, medical history, surgical history, family history, social history, and previous encounter notes.   Time spent on visit including pre-visit chart review and post-visit care and charting was 41 minutes.    Porcia Morganti, PA-C

## 2024-02-23 DIAGNOSIS — R9389 Abnormal findings on diagnostic imaging of other specified body structures: Secondary | ICD-10-CM | POA: Diagnosis not present

## 2024-02-23 DIAGNOSIS — N920 Excessive and frequent menstruation with regular cycle: Secondary | ICD-10-CM | POA: Diagnosis not present

## 2024-02-23 DIAGNOSIS — N939 Abnormal uterine and vaginal bleeding, unspecified: Secondary | ICD-10-CM | POA: Diagnosis not present

## 2024-03-05 ENCOUNTER — Other Ambulatory Visit (INDEPENDENT_AMBULATORY_CARE_PROVIDER_SITE_OTHER): Payer: Self-pay | Admitting: Physician Assistant

## 2024-03-05 DIAGNOSIS — R7303 Prediabetes: Secondary | ICD-10-CM

## 2024-03-09 ENCOUNTER — Other Ambulatory Visit (INDEPENDENT_AMBULATORY_CARE_PROVIDER_SITE_OTHER): Payer: Self-pay | Admitting: Family Medicine

## 2024-03-09 ENCOUNTER — Other Ambulatory Visit (INDEPENDENT_AMBULATORY_CARE_PROVIDER_SITE_OTHER): Payer: Self-pay | Admitting: Physician Assistant

## 2024-03-09 DIAGNOSIS — R7303 Prediabetes: Secondary | ICD-10-CM

## 2024-03-09 MED ORDER — METFORMIN HCL 500 MG PO TABS: 500.0000 mg | ORAL_TABLET | Freq: Two times a day (BID) | ORAL | 0 refills | Status: DC

## 2024-03-09 NOTE — Telephone Encounter (Signed)
 Pt is calling about her Metformin , she stated that no one has reached out to her about it yet. Please follow up with patient.

## 2024-03-09 NOTE — Telephone Encounter (Signed)
 LAST APPOINTMENT DATE: 02/17/2024 NEXT APPOINTMENT DATE: 03/31/2024   [facility] - [place] - [address] [address] [place] [address] [place] [address] Phone: [phone] Fax: [phone]  Patient is requesting a refill of the following medications: Requested Prescriptions    No prescriptions requested or ordered in this encounter    Date last filled: 01/06/2024 Previously prescribed by Shawn Rayburn  Lab Results  Component Value Date   HGBA1C 5.9 (H) 10/14/2023   HGBA1C 6.2 (H) 04/08/2023   HGBA1C 5.7 (H) 09/23/2022   Lab Results  Component Value Date   LDLCALC 81 06/26/2020   CREATININE 0.67 10/14/2023   Lab Results  Component Value Date   VD25OH 63.3 10/14/2023   VD25OH 49.0 04/08/2023   VD25OH 44.7 09/23/2022    BP Readings from Last 3 Encounters:  02/17/24 111/76  01/06/24 103/72  12/11/23 130/80

## 2024-03-09 NOTE — Telephone Encounter (Signed)
 6/24 pt says her metformin  was denied , pt is all out of her meds

## 2024-03-31 ENCOUNTER — Encounter (INDEPENDENT_AMBULATORY_CARE_PROVIDER_SITE_OTHER): Payer: Self-pay | Admitting: Family Medicine

## 2024-03-31 ENCOUNTER — Ambulatory Visit (INDEPENDENT_AMBULATORY_CARE_PROVIDER_SITE_OTHER): Admitting: Family Medicine

## 2024-03-31 VITALS — BP 114/79 | HR 86 | Temp 98.5°F | Ht 64.0 in | Wt 247.0 lb

## 2024-03-31 DIAGNOSIS — N921 Excessive and frequent menstruation with irregular cycle: Secondary | ICD-10-CM

## 2024-03-31 DIAGNOSIS — E669 Obesity, unspecified: Secondary | ICD-10-CM

## 2024-03-31 DIAGNOSIS — Z6841 Body Mass Index (BMI) 40.0 and over, adult: Secondary | ICD-10-CM

## 2024-03-31 DIAGNOSIS — Z6379 Other stressful life events affecting family and household: Secondary | ICD-10-CM

## 2024-03-31 DIAGNOSIS — R5383 Other fatigue: Secondary | ICD-10-CM

## 2024-03-31 DIAGNOSIS — R7303 Prediabetes: Secondary | ICD-10-CM | POA: Diagnosis not present

## 2024-03-31 DIAGNOSIS — R0602 Shortness of breath: Secondary | ICD-10-CM | POA: Diagnosis not present

## 2024-03-31 DIAGNOSIS — E559 Vitamin D deficiency, unspecified: Secondary | ICD-10-CM

## 2024-03-31 MED ORDER — SERTRALINE HCL 50 MG PO TABS: 50.0000 mg | ORAL_TABLET | Freq: Every day | ORAL | 0 refills | Status: DC

## 2024-03-31 MED ORDER — METFORMIN HCL 500 MG PO TABS: 500.0000 mg | ORAL_TABLET | Freq: Two times a day (BID) | ORAL | 0 refills | Status: DC

## 2024-03-31 NOTE — Progress Notes (Signed)
 SUBJECTIVE:  Chief Complaint: Obesity  Interim History: Since last appointment patient mentions she has been busy- doing quite a bit of traveling.  She is dealing with significant familial stressors with her twin.  Patient has been feeling significantly more tired recently- feels completely wiped regardless of amount of sleep she has gotten.  When she is home she isn't snacking and is sometimes not eating lunch.    Catherine Christian is here to discuss her progress with her obesity treatment plan. She is on the Category 4 Plan and states she is following her eating plan approximately 25 % of the time. She states she is walking.   OBJECTIVE: Visit Diagnoses: Problem List Items Addressed This Visit       Other   Prediabetes   Relevant Medications   metFORMIN  (GLUCOPHAGE ) 500 MG tablet   Other Relevant Orders   Comprehensive metabolic panel with GFR   Hemoglobin A1c   Insulin , random   Vitamin D  deficiency   Relevant Orders   VITAMIN D  25 Hydroxy (Vit-D Deficiency, Fractures)   SOBOE (shortness of breath on exertion) - Primary   Patient last RMR of 2462 on 09/23/22.  She is feeling tired and significantly less energy than normal. Indirect calorimetry done today and RMR of 2419.  Can continue current meal plan with focus on calories and protein.      Other Visit Diagnoses       Fatigue, unspecified type       Relevant Orders   TSH     Menorrhagia with irregular cycle       Relevant Orders   CBC w/Diff/Platelet     Obesity with starting BMI of 40.5       Relevant Medications   metFORMIN  (GLUCOPHAGE ) 500 MG tablet     BMI 40.0-44.9, adult (HCC) BMI 42.2       Relevant Medications   metFORMIN  (GLUCOPHAGE ) 500 MG tablet     Stressful life event affecting family       Relevant Medications   sertraline  (ZOLOFT ) 50 MG tablet       Vitals Temp: 98.5 F (36.9 C) BP: 114/79 Pulse Rate: 86 SpO2: 97 %   Anthropometric Measurements Height: 5' 4 (1.626 m) Weight: 247 lb (112  kg) BMI (Calculated): 42.38 Weight at Last Visit: 245 Weight Lost Since Last Visit: 0 Weight Gained Since Last Visit: 2 Starting Weight: 236 lb Total Weight Loss (lbs): 0 lb (0 kg) Peak Weight: 270 lb   Body Composition  Body Fat %: 46.4 % Fat Mass (lbs): 115 lbs Muscle Mass (lbs): 126.2 lbs Total Body Water  (lbs): 89 lbs Visceral Fat Rating : 12   Other Clinical Data RMR: 2419 Fasting: yes Labs: yes Today's Visit #: 41 Starting Date: 06/26/20 Comments: Cat 4     ASSESSMENT AND PLAN: Assessment & Plan SOBOE (shortness of breath on exertion) Patient last RMR of 2462 on 09/23/22.  She is feeling tired and significantly less energy than normal. Indirect calorimetry done today and RMR of 2419.  Can continue current meal plan with focus on calories and protein. Fatigue, unspecified type  Menorrhagia with irregular cycle Discussed irregular cycles and bleeding with gyn- likely diagnosis is adenomyosis.  She will try provera for 3 months.  Will follow up on plan after that time. Vitamin D  deficiency Previously on vitamin d  supplementation and needs a repeat level today to assess where she is in terms of value to determine supplementation needs. Prediabetes Tolerating metformin  with no GI side effects.  Needs a refill today and she is fasting for labs- CMP, A1c and Insulin  level ordered.  Will discuss results at next appointment.  Patient to continue current meal plan. Obesity with starting BMI of 40.5  BMI 40.0-44.9, adult (HCC) BMI 42.2  Stressful life event affecting family Recent significant stressor involving her twin sister.  She is occasional tearful today.  She feels significant fatigue and less desire to engage in activities.  She is open to pharmaceutical usage and agreed to start sertraline  25mg  daily with increase to 50mg  daily after 1 week.   Diet: Catherine Christian is currently in the action stage of change. As such, her goal is to continue with weight loss efforts and has  agreed to the Category 4 Plan.   Exercise:  For substantial health benefits, adults should do at least 150 minutes (2 hours and 30 minutes) a week of moderate-intensity, or 75 minutes (1 hour and 15 minutes) a week of vigorous-intensity aerobic physical activity, or an equivalent combination of moderate- and vigorous-intensity aerobic activity. Aerobic activity should be performed in episodes of at least 10 minutes, and preferably, it should be spread throughout the week.  Behavior Modification:  We discussed the following Behavioral Modification Strategies today: increasing lean protein intake, decreasing simple carbohydrates, increasing vegetables, meal planning and cooking strategies, keeping healthy foods in the home, and keep a strict food journal.   Return in about 4 weeks (around 04/28/2024).   She was informed of the importance of frequent follow up visits to maximize her success with intensive lifestyle modifications for her multiple health conditions.  Attestation Statements:   Reviewed by clinician on day of visit: allergies, medications, problem list, medical history, surgical history, family history, social history, and previous encounter notes.     Adelita Cho, MD

## 2024-03-31 NOTE — Assessment & Plan Note (Addendum)
 Patient last RMR of 2462 on 09/23/22.  She is feeling tired and significantly less energy than normal. Indirect calorimetry done today and RMR of 2419.  Can continue current meal plan with focus on calories and protein.

## 2024-04-01 LAB — CBC WITH DIFFERENTIAL/PLATELET
Basophils Absolute: 0.1 x10E3/uL (ref 0.0–0.2)
Basos: 2 %
EOS (ABSOLUTE): 0.2 x10E3/uL (ref 0.0–0.4)
Eos: 3 %
Hematocrit: 43.5 % (ref 34.0–46.6)
Hemoglobin: 14.1 g/dL (ref 11.1–15.9)
Immature Grans (Abs): 0 x10E3/uL (ref 0.0–0.1)
Immature Granulocytes: 0 %
Lymphocytes Absolute: 2.8 x10E3/uL (ref 0.7–3.1)
Lymphs: 42 %
MCH: 29.6 pg (ref 26.6–33.0)
MCHC: 32.4 g/dL (ref 31.5–35.7)
MCV: 91 fL (ref 79–97)
Monocytes Absolute: 0.4 x10E3/uL (ref 0.1–0.9)
Monocytes: 6 %
Neutrophils Absolute: 3.3 x10E3/uL (ref 1.4–7.0)
Neutrophils: 47 %
Platelets: 269 x10E3/uL (ref 150–450)
RBC: 4.76 x10E6/uL (ref 3.77–5.28)
RDW: 12.7 % (ref 11.7–15.4)
WBC: 6.8 x10E3/uL (ref 3.4–10.8)

## 2024-04-01 LAB — COMPREHENSIVE METABOLIC PANEL WITH GFR
ALT: 31 IU/L (ref 0–32)
AST: 23 IU/L (ref 0–40)
Albumin: 4.5 g/dL (ref 3.9–4.9)
Alkaline Phosphatase: 51 IU/L (ref 44–121)
BUN/Creatinine Ratio: 21 (ref 9–23)
BUN: 16 mg/dL (ref 6–20)
Bilirubin Total: 0.4 mg/dL (ref 0.0–1.2)
CO2: 21 mmol/L (ref 20–29)
Calcium: 9.6 mg/dL (ref 8.7–10.2)
Chloride: 102 mmol/L (ref 96–106)
Creatinine, Ser: 0.75 mg/dL (ref 0.57–1.00)
Globulin, Total: 2.3 g/dL (ref 1.5–4.5)
Glucose: 120 mg/dL — ABNORMAL HIGH (ref 70–99)
Potassium: 4.4 mmol/L (ref 3.5–5.2)
Sodium: 141 mmol/L (ref 134–144)
Total Protein: 6.8 g/dL (ref 6.0–8.5)
eGFR: 108 mL/min/1.73 (ref >59)

## 2024-04-01 LAB — HEMOGLOBIN A1C
Est. average glucose Bld gHb Est-mCnc: 128 mg/dL
Hgb A1c MFr Bld: 6.1 % — ABNORMAL HIGH (ref 4.8–5.6)

## 2024-04-01 LAB — INSULIN, RANDOM: INSULIN: 24.8 u[IU]/mL (ref 2.6–24.9)

## 2024-04-01 LAB — VITAMIN D 25 HYDROXY (VIT D DEFICIENCY, FRACTURES): Vit D, 25-Hydroxy: 54.3 ng/mL (ref 30.0–100.0)

## 2024-04-01 LAB — TSH: TSH: 1.86 u[IU]/mL (ref 0.450–4.500)

## 2024-04-12 NOTE — Assessment & Plan Note (Signed)
 Tolerating metformin  with no GI side effects.  Needs a refill today and she is fasting for labs- CMP, A1c and Insulin  level ordered.  Will discuss results at next appointment.  Patient to continue current meal plan.

## 2024-04-12 NOTE — Assessment & Plan Note (Signed)
 Previously on vitamin d  supplementation and needs a repeat level today to assess where she is in terms of value to determine supplementation needs.

## 2024-04-29 ENCOUNTER — Ambulatory Visit (INDEPENDENT_AMBULATORY_CARE_PROVIDER_SITE_OTHER): Admitting: Family Medicine

## 2024-04-29 ENCOUNTER — Encounter (INDEPENDENT_AMBULATORY_CARE_PROVIDER_SITE_OTHER): Payer: Self-pay | Admitting: Family Medicine

## 2024-04-29 VITALS — BP 116/71 | HR 107 | Temp 98.4°F | Ht 64.0 in | Wt 246.0 lb

## 2024-04-29 DIAGNOSIS — E559 Vitamin D deficiency, unspecified: Secondary | ICD-10-CM

## 2024-04-29 DIAGNOSIS — E669 Obesity, unspecified: Secondary | ICD-10-CM

## 2024-04-29 DIAGNOSIS — R7303 Prediabetes: Secondary | ICD-10-CM

## 2024-04-29 DIAGNOSIS — Z6841 Body Mass Index (BMI) 40.0 and over, adult: Secondary | ICD-10-CM | POA: Diagnosis not present

## 2024-04-29 NOTE — Progress Notes (Signed)
   SUBJECTIVE:  Chief Complaint: Obesity  Interim History: Patient started watching a baby M-F and the baby is very colicky and cried for 3 weeks.  She is just figuring out how to implement more activity in.  She is sticking to breakfast, trying to eat lunch- protein shake occasionally or dried edamame.  She isn't getting all the protein in at supper and is feeling burnt out on chicken. Protein/ meat in general is fairly difficult for her to get in. She mentions she is going to the beach with her kids and husband at the end of the summer for a vacation.   Catherine Christian is here to discuss her progress with her obesity treatment plan. She is on the Category 4 Plan and states she is following her eating plan approximately 70 % of the time. She states she is walking.   OBJECTIVE: Visit Diagnoses: Problem List Items Addressed This Visit   None   Vitals Temp: 98.4 F (36.9 C) BP: 116/71 Pulse Rate: (!) 107 SpO2: 99 %   Anthropometric Measurements Height: 5' 4 (1.626 m) Weight: 246 lb (111.6 kg) BMI (Calculated): 42.21 Weight at Last Visit: 247 lb Weight Lost Since Last Visit: 1 Weight Gained Since Last Visit: 0 Starting Weight: 236 lb Total Weight Loss (lbs): 0 lb (0 kg)   Body Composition  Body Fat %: 45.1 % Fat Mass (lbs): 111 lbs Muscle Mass (lbs): 128.2 lbs Total Body Water  (lbs): 89.4 lbs Visceral Fat Rating : 12   Other Clinical Data Today's Visit #: 70 Starting Date: 06/26/20 Comments: cat 4     ASSESSMENT AND PLAN: Assessment & Plan Prediabetes A1c from last appointment discussed with patient today.  Increased from 5.9-6.1.  We discussed the importance of limiting simple carbohydrates and really recommitting to meal plan with monitoring intake at all meals as well as snacks.  Will need repeat labs done in January. Vitamin D  deficiency Vitamin D  goal slightly below previous level in January.  Patient to continue current vitamin D  supplementation. Obesity with  starting BMI of 40.5  BMI 40.0-44.9, adult (HCC) BMI 42.2    Diet: Catherine Christian is currently in the action stage of change. As such, her goal is to continue with weight loss efforts and has agreed to the Category 4 Plan.   Exercise:  For substantial health benefits, adults should do at least 150 minutes (2 hours and 30 minutes) a week of moderate-intensity, or 75 minutes (1 hour and 15 minutes) a week of vigorous-intensity aerobic physical activity, or an equivalent combination of moderate- and vigorous-intensity aerobic activity. Aerobic activity should be performed in episodes of at least 10 minutes, and preferably, it should be spread throughout the week.  Behavior Modification:  We discussed the following Behavioral Modification Strategies today: increasing lean protein intake, decreasing simple carbohydrates, increasing vegetables, meal planning and cooking strategies, and planning for success.   No follow-ups on file.   She was informed of the importance of frequent follow up visits to maximize her success with intensive lifestyle modifications for her multiple health conditions.  Attestation Statements:   Reviewed by clinician on day of visit: allergies, medications, problem list, medical history, surgical history, family history, social history, and previous encounter notes.    Adelita Cho, MD

## 2024-05-09 NOTE — Assessment & Plan Note (Signed)
 Vitamin D  goal slightly below previous level in January.  Patient to continue current vitamin D  supplementation.

## 2024-05-09 NOTE — Assessment & Plan Note (Signed)
 A1c from last appointment discussed with patient today.  Increased from 5.9-6.1.  We discussed the importance of limiting simple carbohydrates and really recommitting to meal plan with monitoring intake at all meals as well as snacks.  Will need repeat labs done in January.

## 2024-05-31 ENCOUNTER — Ambulatory Visit (INDEPENDENT_AMBULATORY_CARE_PROVIDER_SITE_OTHER): Admitting: Family Medicine

## 2024-06-03 ENCOUNTER — Encounter (INDEPENDENT_AMBULATORY_CARE_PROVIDER_SITE_OTHER): Payer: Self-pay | Admitting: Family Medicine

## 2024-06-03 ENCOUNTER — Ambulatory Visit (INDEPENDENT_AMBULATORY_CARE_PROVIDER_SITE_OTHER): Admitting: Family Medicine

## 2024-06-03 VITALS — BP 120/76 | HR 91 | Temp 98.6°F | Ht 64.0 in | Wt 244.0 lb

## 2024-06-03 DIAGNOSIS — E669 Obesity, unspecified: Secondary | ICD-10-CM | POA: Diagnosis not present

## 2024-06-03 DIAGNOSIS — R7303 Prediabetes: Secondary | ICD-10-CM | POA: Diagnosis not present

## 2024-06-03 DIAGNOSIS — Z6379 Other stressful life events affecting family and household: Secondary | ICD-10-CM | POA: Diagnosis not present

## 2024-06-03 DIAGNOSIS — Z6841 Body Mass Index (BMI) 40.0 and over, adult: Secondary | ICD-10-CM

## 2024-06-03 MED ORDER — SERTRALINE HCL 50 MG PO TABS: 50.0000 mg | ORAL_TABLET | Freq: Every day | ORAL | 0 refills | Status: DC

## 2024-06-03 MED ORDER — METFORMIN HCL 500 MG PO TABS: 500.0000 mg | ORAL_TABLET | Freq: Two times a day (BID) | ORAL | 0 refills | Status: DC

## 2024-06-03 NOTE — Progress Notes (Signed)
 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  She did go to the beach prior to the end of summer. First two week she was on Category 4 and then took a bit of time off and then went back to journaling.  She has meal prepped more to make faster grab and go options.  She has opted for more breakfast burritos.  No anticipated time away for the next few weeks and no events.  She is interested in possibly pursuing intermittent fasting.  Catherine Christian is here to discuss her progress with her obesity treatment plan. She is on the Category 4 Plan and states she is following her eating plan approximately 70 % of the time. She states she is walking 10,000 steps 7 times per week.   OBJECTIVE: Visit Diagnoses: Problem List Items Addressed This Visit       Other   Prediabetes - Primary   Relevant Medications   metFORMIN  (GLUCOPHAGE ) 500 MG tablet   Other Visit Diagnoses       Stressful life event affecting family       Relevant Medications   sertraline  (ZOLOFT ) 50 MG tablet     Obesity with starting BMI of 40.5       Relevant Medications   metFORMIN  (GLUCOPHAGE ) 500 MG tablet     BMI 40.0-44.9, adult (HCC) BMI 42.2       Relevant Medications   metFORMIN  (GLUCOPHAGE ) 500 MG tablet       Vitals Temp: 98.6 F (37 C) BP: 120/76 Pulse Rate: 91 SpO2: 100 %   Anthropometric Measurements Height: 5' 4 (1.626 m) Weight: 244 lb (110.7 kg) BMI (Calculated): 41.86 Weight at Last Visit: 246 lb Weight Lost Since Last Visit: 2 Weight Gained Since Last Visit: 0 Starting Weight: 236 lb Total Weight Loss (lbs): 0 lb (0 kg)   Body Composition  Body Fat %: 47.7 % Fat Mass (lbs): 116.4 lbs Muscle Mass (lbs): 121.2 lbs Total Body Water  (lbs): 89.4 lbs Visceral Fat Rating : 13   Other Clinical Data Today's Visit #: 55 Starting Date: 06/26/20 Comments: Cat 4     ASSESSMENT AND PLAN: Assessment &  Plan Prediabetes Most recent A1c elevated at 6.1.  Patient reports she has done much better at controlling snack intake as well as getting more aligned with dietary plan she was on previously.  Continue to work on meal prepping and planning to ensure adequate nutrition.  Will need repeat labs in January 2026.  Stable on metformin  and needs a refill today.  No change in dosage at this time. Stressful life event affecting family Patient sister's husband/patient's brother-in-law was recently arrested for domestic violence.  This event has been very stressful for her.  She reports improvement in her symptoms on sertraline .  Needs a refill today of current dose.  No suicidal or homicidal ideation reported. Obesity with starting BMI of 40.5  BMI 40.0-44.9, adult (HCC) BMI 42.2    Diet: Catherine Christian is currently in the action stage of change. As such, her goal is to continue with weight loss efforts and has agreed to the Category 4 Plan or intermittent fasting 16:8- patient to be mindful of food intake for the 8 hours she is eating.    Exercise:  For substantial health benefits, adults should do at least 150 minutes (2 hours and 30 minutes) a week of moderate-intensity, or 75 minutes (1 hour and 15 minutes) a week  of vigorous-intensity aerobic physical activity, or an equivalent combination of moderate- and vigorous-intensity aerobic activity. Aerobic activity should be performed in episodes of at least 10 minutes, and preferably, it should be spread throughout the week.  Behavior Modification:  We discussed the following Behavioral Modification Strategies today: increasing lean protein intake, decreasing simple carbohydrates, increasing vegetables, meal planning and cooking strategies, and planning for success.   Return in about 5 weeks (around 07/08/2024).   She was informed of the importance of frequent follow up visits to maximize her success with intensive lifestyle modifications for her multiple  health conditions.  Attestation Statements:   Reviewed by clinician on day of visit: allergies, medications, problem list, medical history, surgical history, family history, social history, and previous encounter notes.     Catherine Cho, MD

## 2024-06-06 DIAGNOSIS — H5203 Hypermetropia, bilateral: Secondary | ICD-10-CM | POA: Diagnosis not present

## 2024-06-12 NOTE — Assessment & Plan Note (Signed)
 Most recent A1c elevated at 6.1.  Patient reports she has done much better at controlling snack intake as well as getting more aligned with dietary plan she was on previously.  Continue to work on meal prepping and planning to ensure adequate nutrition.  Will need repeat labs in January 2026.  Stable on metformin  and needs a refill today.  No change in dosage at this time.

## 2024-07-14 ENCOUNTER — Ambulatory Visit (INDEPENDENT_AMBULATORY_CARE_PROVIDER_SITE_OTHER): Admitting: Family Medicine

## 2024-07-14 ENCOUNTER — Encounter (INDEPENDENT_AMBULATORY_CARE_PROVIDER_SITE_OTHER): Payer: Self-pay | Admitting: Family Medicine

## 2024-07-14 VITALS — BP 112/71 | HR 96 | Temp 98.6°F | Ht 64.0 in | Wt 243.0 lb

## 2024-07-14 DIAGNOSIS — R7303 Prediabetes: Secondary | ICD-10-CM | POA: Diagnosis not present

## 2024-07-14 DIAGNOSIS — Z6379 Other stressful life events affecting family and household: Secondary | ICD-10-CM | POA: Diagnosis not present

## 2024-07-14 DIAGNOSIS — Z6841 Body Mass Index (BMI) 40.0 and over, adult: Secondary | ICD-10-CM

## 2024-07-14 DIAGNOSIS — E669 Obesity, unspecified: Secondary | ICD-10-CM

## 2024-07-14 NOTE — Progress Notes (Signed)
 28  SUBJECTIVE:  Chief Complaint: Obesity  Interim History: Patient is currently awaiting daughters evaluation for recurrent passing out. She has been starting intermittent fasting and she feels encouraged to start fasting.  She felt she went from not being hungry to being overly hungry.  She has been meal prepping and just trying to figure out implementation and making sure she is getting enough protein.  Catherine Christian is here to discuss her progress with her obesity treatment plan. She is on the fasting and states she is not following her eating plan approximately 0 % of the time. She states she is not exercising 0 minutes 0 times per week. 10,000 steps casing kids at home.   OBJECTIVE: Visit Diagnoses: Problem List Items Addressed This Visit   None   Vitals Temp: 98.6 F (37 C) BP: 112/71 Pulse Rate: 96 SpO2: 100 %   Anthropometric Measurements Height: 5' 4 (1.626 m) Weight: 243 lb (110.2 kg) BMI (Calculated): 41.69 Weight at Last Visit: 244lb Weight Lost Since Last Visit: 1lb Weight Gained Since Last Visit: 0lb Starting Weight: 236lb Total Weight Loss (lbs): 0 lb (0 kg) Peak Weight: 270lb   Body Composition  Body Fat %: 48.1 % Fat Mass (lbs): 116.8 lbs Muscle Mass (lbs): 126.2 lbs Total Body Water  (lbs): 119.8 lbs Visceral Fat Rating : 13   Other Clinical Data RMR: [id number] Fasting: No Labs: No Today's Visit #: 44 Starting Date: 06/26/20 Comments: Cat 4     ASSESSMENT AND PLAN: Assessment & Plan Stressful life event affecting family Noticing improvement in response to stress on sertraline  50 mg daily.  Will continue current prescription and dosage.  No refill needed at this time. Prediabetes Patient has increase metformin  to twice a day usage but still occasionally has indulge in eating tendencies.  No GI side effects of metformin  at this dosage.  Will follow-up at next lab to evaluate effect on A1c.  Discussed importance of limiting indulgent carbohydrate  intake especially over the holidays.  Will follow-up on labs early next year. Obesity with starting BMI of 40.5  BMI 40.0-44.9, adult (HCC) BMI 42.2    Diet: Cherlyn is currently in the action stage of change. As such, her goal is to continue with weight loss efforts and has agreed to the Category 4 Plan with intermittent fasting.   Exercise:  For substantial health benefits, adults should do at least 150 minutes (2 hours and 30 minutes) a week of moderate-intensity, or 75 minutes (1 hour and 15 minutes) a week of vigorous-intensity aerobic physical activity, or an equivalent combination of moderate- and vigorous-intensity aerobic activity. Aerobic activity should be performed in episodes of at least 10 minutes, and preferably, it should be spread throughout the week.  Behavior Modification:  We discussed the following Behavioral Modification Strategies today: increasing lean protein intake, decreasing simple carbohydrates, increasing vegetables, meal planning and cooking strategies, and planning for success.   Return in about 4 weeks (around 08/11/2024).   She was informed of the importance of frequent follow up visits to maximize her success with intensive lifestyle modifications for her multiple health conditions.  Attestation Statements:   Reviewed by clinician on day of visit: allergies, medications, problem list, medical history, surgical history, family history, social history, and previous encounter notes.   Adelita Cho, MD

## 2024-07-15 ENCOUNTER — Encounter (INDEPENDENT_AMBULATORY_CARE_PROVIDER_SITE_OTHER): Payer: Self-pay | Admitting: Family Medicine

## 2024-07-15 NOTE — Telephone Encounter (Signed)
**Note De-identified  [place] Obfuscation** Please advise 

## 2024-07-18 NOTE — Assessment & Plan Note (Signed)
 Patient has increase metformin  to twice a day usage but still occasionally has indulge in eating tendencies.  No GI side effects of metformin  at this dosage.  Will follow-up at next lab to evaluate effect on A1c.  Discussed importance of limiting indulgent carbohydrate intake especially over the holidays.  Will follow-up on labs early next year.

## 2024-08-25 ENCOUNTER — Ambulatory Visit (INDEPENDENT_AMBULATORY_CARE_PROVIDER_SITE_OTHER): Payer: Self-pay | Admitting: Family Medicine

## 2024-08-25 VITALS — BP 109/75 | HR 88 | Temp 98.0°F | Ht 64.0 in | Wt 242.0 lb

## 2024-08-25 DIAGNOSIS — Z6841 Body Mass Index (BMI) 40.0 and over, adult: Secondary | ICD-10-CM

## 2024-08-25 DIAGNOSIS — R7303 Prediabetes: Secondary | ICD-10-CM

## 2024-08-25 DIAGNOSIS — Z6379 Other stressful life events affecting family and household: Secondary | ICD-10-CM

## 2024-08-25 DIAGNOSIS — E669 Obesity, unspecified: Secondary | ICD-10-CM

## 2024-08-25 MED ORDER — METFORMIN HCL 500 MG PO TABS: 500.0000 mg | ORAL_TABLET | Freq: Two times a day (BID) | ORAL | 0 refills | Status: AC

## 2024-08-25 MED ORDER — SERTRALINE HCL 50 MG PO TABS: 50.0000 mg | ORAL_TABLET | Freq: Every day | ORAL | 0 refills | Status: AC

## 2024-08-25 NOTE — Progress Notes (Signed)
 "  SUBJECTIVE:  Chief Complaint: Obesity  Interim History: Patient here for regular follow up.  Having significant family stressors with her mother and her sister.  For Thanksgiving she stayed in town and went to her husband's family.  She did not feel the need to be overly indulgent.  She has been staying consistent on meal plan as much as she can over the next holidays.  She is planning to stay local for Christmas and New Years.  She was doing intermittent fasting and there were times she was more consistent than others.  Opting toward 16:8 in terms of schedule for fasting.  Leisa is here to discuss her progress with her obesity treatment plan. She is on the Category 4 Plan and intermittant fasting and states she is following her eating plan approximately 50 % of the time. She states she is not exercising.   OBJECTIVE: Visit Diagnoses: Problem List Items Addressed This Visit       Other   Prediabetes - Primary   Relevant Medications   metFORMIN  (GLUCOPHAGE ) 500 MG tablet   Other Visit Diagnoses       Stressful life event affecting family       Relevant Medications   sertraline  (ZOLOFT ) 50 MG tablet     BMI 40.0-44.9, adult (HCC) BMI 42.2       Relevant Medications   metFORMIN  (GLUCOPHAGE ) 500 MG tablet     Obesity with starting BMI of 40.5       Relevant Medications   metFORMIN  (GLUCOPHAGE ) 500 MG tablet       Vitals Temp: 98 F (36.7 C) BP: 109/75 Pulse Rate: 88 SpO2: 98 %   Anthropometric Measurements Height: 5' 4 (1.626 m) Weight: 242 lb (109.8 kg) BMI (Calculated): 41.52 Weight at Last Visit: 243 lb Weight Lost Since Last Visit: 1 Weight Gained Since Last Visit: 0 Starting Weight: 236 lb Total Weight Loss (lbs): 0 lb (0 kg)   Body Composition  Body Fat %: 47.4 % Fat Mass (lbs): 114.8 lbs Muscle Mass (lbs): 120.8 lbs Total Body Water  (lbs): 88.4 lbs Visceral Fat Rating : 12   Other Clinical Data Today's Visit #: 45 Starting Date:  06/26/20 Comments: Cat 4     ASSESSMENT AND PLAN: Assessment & Plan Prediabetes On metformin  daily with no GI side effects.  Needs a refill today.  No change in dose. Stressful life event affecting family Patient tolerating zoloft  and needs a refill today.  No suicidal or homicidal ideation.  Refill sent to pharmacy. BMI 40.0-44.9, adult (HCC) BMI 42.2  Obesity with starting BMI of 40.5    Diet: Kennede is currently in the action stage of change. As such, her goal is to continue with weight loss efforts and has agreed to the Category 4 Plan.   Exercise:  All adults should avoid inactivity. Some activity is better than none, and adults who participate in any amount of physical activity, gain some health benefits.  Behavior Modification:  We discussed the following Behavioral Modification Strategies today: increasing lean protein intake, decreasing simple carbohydrates, increasing vegetables, meal planning and cooking strategies, holiday eating strategies, and planning for success.   Return in about 8 weeks (around 10/20/2024).   She was informed of the importance of frequent follow up visits to maximize her success with intensive lifestyle modifications for her multiple health conditions.  Attestation Statements:   Reviewed by clinician on day of visit: allergies, medications, problem list, medical history, surgical history, family history, social history, and previous  encounter notes.    Adelita Cho, MD "

## 2024-09-12 NOTE — Assessment & Plan Note (Signed)
 On metformin  daily with no GI side effects.  Needs a refill today.  No change in dose.

## 2024-10-20 ENCOUNTER — Encounter: Payer: Self-pay | Admitting: Obstetrics & Gynecology

## 2024-10-20 ENCOUNTER — Ambulatory Visit: Admitting: Obstetrics & Gynecology

## 2024-10-20 VITALS — BP 128/79 | HR 105 | Ht 64.0 in | Wt 248.0 lb

## 2024-10-20 DIAGNOSIS — Z1331 Encounter for screening for depression: Secondary | ICD-10-CM

## 2024-10-20 DIAGNOSIS — N920 Excessive and frequent menstruation with regular cycle: Secondary | ICD-10-CM | POA: Diagnosis not present

## 2024-10-20 DIAGNOSIS — Z01419 Encounter for gynecological examination (general) (routine) without abnormal findings: Secondary | ICD-10-CM | POA: Diagnosis not present

## 2024-10-20 DIAGNOSIS — Z1151 Encounter for screening for human papillomavirus (HPV): Secondary | ICD-10-CM | POA: Diagnosis not present

## 2024-10-20 MED ORDER — TRANEXAMIC ACID 650 MG PO TABS: ORAL_TABLET | ORAL | 11 refills | Status: AC

## 2024-10-20 NOTE — Progress Notes (Signed)
 "  WELL-WOMAN EXAMINATION Patient name: Catherine Christian MRN [id number]  Date of birth: 1990/10/29 Chief Complaint:   Gynecologic Exam  History of Present Illness:   Catherine Christian is a 34 y.o. [phone] female being seen today for a routine well-woman exam.   Menses are regular, but heavy- may bleed through an ultra tampon in under an hour.   Tried taking provera to help improve bleeding  Denies intermenstrual bleeding.  Denies vaginal discharge, itching or irritation.  Denies pelvic or abdominal pain.  Reports no acute GYN concerns    Patient's last menstrual period was 10/13/2024.  The current method of family planning is none.    Last pap 2023.  Last mammogram: NA. Last colonoscopy: NA     10/20/2024    9:31 AM 06/26/2020    8:32 AM 02/02/2020    5:18 PM  Depression screen PHQ 2/9  Decreased Interest 1 2 0  Down, Depressed, Hopeless 1 1 0  PHQ - 2 Score 2 3 0  Altered sleeping 1 0   Tired, decreased energy 1 1   Change in appetite 0 2   Feeling bad or failure about yourself  1 1   Trouble concentrating 0 1   Moving slowly or fidgety/restless 0 0   Suicidal thoughts 0 0   PHQ-9 Score 5 8    Difficult doing work/chores  Not difficult at all      Data saved with a previous flowsheet row definition      Review of Systems:   Pertinent items are noted in HPI Denies any headaches, blurred vision, fatigue, shortness of breath, chest pain, abdominal pain, bowel movements, urination, or intercourse unless otherwise stated above.  Pertinent History Reviewed:  Reviewed past medical,surgical, social and family history.  Reviewed problem list, medications and allergies. Physical Assessment:   Vitals:   10/20/24 0917  BP: 128/79  Pulse: (!) 105  Weight: 248 lb (112.5 kg)  Height: 5' 4 (1.626 m)  Body mass index is 42.57 kg/m.        Physical Examination:   General appearance - well appearing, and in no distress  Mental status - alert, oriented to  person, place, and time  Psych:  She has a normal mood and affect  Skin - warm and dry, normal color, no suspicious lesions noted  Chest - effort normal, all lung fields clear to auscultation bilaterally  Heart - normal rate and regular rhythm  Neck:  midline trachea, no thyromegaly or nodules  Breasts - breasts appear normal, no suspicious masses, no skin or nipple changes or  axillary nodes  Abdomen - soft, nontender, nondistended, no masses or organomegaly  Pelvic - Normal external genitalia including normal appearing vulva with no masses, tenderness or lesions  Normal urethral meatus.  VAGINA: normal appearing vagina with normal color and discharge, no lesions  CERVIX: normal appearing cervix without discharge or lesions, no CMT  Thin prep pap is done with HR HPV cotesting  UTERUS: uterus is felt to be normal size, shape, consistency and nontender   ADNEXA: No adnexal masses or tenderness noted. Extremities:  No swelling or varicosities noted  Chaperone: Alan Fischer     Assessment & Plan:  1) Well-Woman Exam -Pap collected, reviewed ASCCP guidelines  2) HMB/Family planning - Due to desire for future fertility, would avoid hormonal contraception's to regulate menses - Discussed Lysteda , Rx sent in, patient to call with any problems   No orders of the defined types were placed in  this encounter.   Meds:  Meds ordered this encounter  Medications   tranexamic acid  (LYSTEDA ) 650 MG TABS tablet    Sig: Take 2 tablets up to 3 times daily during menses for a maximum of 5 days    Dispense:  30 tablet    Refill:  11    Follow-up: Return in about 1 year (around 10/20/2025) for Annual.   Kelyn Ponciano, DO Attending Obstetrician & Gynecologist, [facility], [facility]   "

## 2024-10-21 LAB — CYTOLOGY - PAP
Comment: NEGATIVE
Diagnosis: NEGATIVE
High risk HPV: NEGATIVE

## 2024-10-28 ENCOUNTER — Ambulatory Visit (INDEPENDENT_AMBULATORY_CARE_PROVIDER_SITE_OTHER): Admitting: Family Medicine
